# Patient Record
Sex: Male | Born: 1975 | Race: Black or African American | Hispanic: No | Smoking: Current every day smoker
Health system: Southern US, Community
[De-identification: ages and names within clinical notes are randomized; demographics above are authoritative.]

## PROBLEM LIST (undated history)

## (undated) DIAGNOSIS — J45909 Unspecified asthma, uncomplicated: Secondary | ICD-10-CM

## (undated) DIAGNOSIS — I10 Essential (primary) hypertension: Secondary | ICD-10-CM

## (undated) DIAGNOSIS — K852 Alcohol induced acute pancreatitis without necrosis or infection: Secondary | ICD-10-CM

## (undated) DIAGNOSIS — R42 Dizziness and giddiness: Secondary | ICD-10-CM

## (undated) DIAGNOSIS — M1990 Unspecified osteoarthritis, unspecified site: Secondary | ICD-10-CM

---

## 1998-04-17 ENCOUNTER — Emergency Department (HOSPITAL_COMMUNITY): Admission: EM | Admit: 1998-04-17 | Discharge: 1998-04-17 | Payer: Self-pay | Admitting: Emergency Medicine

## 1998-05-05 HISTORY — PX: REPLANTATION THUMB: SUR1233

## 1998-05-14 ENCOUNTER — Emergency Department (HOSPITAL_COMMUNITY): Admission: EM | Admit: 1998-05-14 | Discharge: 1998-05-14 | Payer: Self-pay | Admitting: Emergency Medicine

## 1998-11-04 ENCOUNTER — Emergency Department (HOSPITAL_COMMUNITY): Admission: EM | Admit: 1998-11-04 | Discharge: 1998-11-04 | Payer: Self-pay | Admitting: *Deleted

## 1999-03-10 ENCOUNTER — Emergency Department (HOSPITAL_COMMUNITY): Admission: EM | Admit: 1999-03-10 | Discharge: 1999-03-10 | Payer: Self-pay | Admitting: Internal Medicine

## 1999-03-13 ENCOUNTER — Emergency Department (HOSPITAL_COMMUNITY): Admission: EM | Admit: 1999-03-13 | Discharge: 1999-03-13 | Payer: Self-pay | Admitting: Emergency Medicine

## 1999-05-28 ENCOUNTER — Emergency Department (HOSPITAL_COMMUNITY): Admission: EM | Admit: 1999-05-28 | Discharge: 1999-05-28 | Payer: Self-pay | Admitting: Internal Medicine

## 1999-12-06 ENCOUNTER — Emergency Department (HOSPITAL_COMMUNITY): Admission: EM | Admit: 1999-12-06 | Discharge: 1999-12-06 | Payer: Self-pay | Admitting: Emergency Medicine

## 1999-12-09 ENCOUNTER — Ambulatory Visit (HOSPITAL_BASED_OUTPATIENT_CLINIC_OR_DEPARTMENT_OTHER): Admission: RE | Admit: 1999-12-09 | Discharge: 1999-12-09 | Payer: Self-pay | Admitting: Orthopedic Surgery

## 1999-12-17 ENCOUNTER — Encounter: Admission: RE | Admit: 1999-12-17 | Discharge: 2000-01-07 | Payer: Self-pay | Admitting: Orthopedic Surgery

## 2000-01-08 ENCOUNTER — Encounter: Admission: RE | Admit: 2000-01-08 | Discharge: 2000-01-20 | Payer: Self-pay | Admitting: Orthopedic Surgery

## 2000-01-09 ENCOUNTER — Emergency Department (HOSPITAL_COMMUNITY): Admission: EM | Admit: 2000-01-09 | Discharge: 2000-01-09 | Payer: Self-pay | Admitting: *Deleted

## 2000-06-23 ENCOUNTER — Emergency Department (HOSPITAL_COMMUNITY): Admission: EM | Admit: 2000-06-23 | Discharge: 2000-06-23 | Payer: Self-pay | Admitting: Emergency Medicine

## 2001-08-10 ENCOUNTER — Emergency Department (HOSPITAL_COMMUNITY): Admission: EM | Admit: 2001-08-10 | Discharge: 2001-08-10 | Payer: Self-pay | Admitting: Emergency Medicine

## 2003-06-26 ENCOUNTER — Emergency Department (HOSPITAL_COMMUNITY): Admission: EM | Admit: 2003-06-26 | Discharge: 2003-06-26 | Payer: Self-pay | Admitting: Emergency Medicine

## 2003-08-15 ENCOUNTER — Emergency Department (HOSPITAL_COMMUNITY): Admission: EM | Admit: 2003-08-15 | Discharge: 2003-08-15 | Payer: Self-pay | Admitting: Emergency Medicine

## 2003-09-06 ENCOUNTER — Emergency Department (HOSPITAL_COMMUNITY): Admission: EM | Admit: 2003-09-06 | Discharge: 2003-09-06 | Payer: Self-pay | Admitting: Emergency Medicine

## 2004-03-22 ENCOUNTER — Emergency Department (HOSPITAL_COMMUNITY): Admission: EM | Admit: 2004-03-22 | Discharge: 2004-03-22 | Payer: Self-pay | Admitting: Emergency Medicine

## 2004-09-26 ENCOUNTER — Emergency Department (HOSPITAL_COMMUNITY): Admission: EM | Admit: 2004-09-26 | Discharge: 2004-09-26 | Payer: Self-pay | Admitting: Emergency Medicine

## 2006-02-01 ENCOUNTER — Emergency Department (HOSPITAL_COMMUNITY): Admission: EM | Admit: 2006-02-01 | Discharge: 2006-02-01 | Payer: Self-pay | Admitting: Emergency Medicine

## 2007-04-16 ENCOUNTER — Emergency Department (HOSPITAL_COMMUNITY): Admission: EM | Admit: 2007-04-16 | Discharge: 2007-04-16 | Payer: Self-pay | Admitting: Emergency Medicine

## 2008-05-23 ENCOUNTER — Emergency Department (HOSPITAL_COMMUNITY): Admission: EM | Admit: 2008-05-23 | Discharge: 2008-05-23 | Payer: Self-pay | Admitting: Emergency Medicine

## 2009-03-01 ENCOUNTER — Emergency Department (HOSPITAL_COMMUNITY): Admission: EM | Admit: 2009-03-01 | Discharge: 2009-03-01 | Payer: Self-pay | Admitting: Emergency Medicine

## 2010-08-19 LAB — LIPASE, BLOOD: Lipase: 31 U/L (ref 11–59)

## 2010-08-19 LAB — COMPREHENSIVE METABOLIC PANEL
ALT: 17 U/L (ref 0–53)
AST: 39 U/L — ABNORMAL HIGH (ref 0–37)
Albumin: 4.1 g/dL (ref 3.5–5.2)
Alkaline Phosphatase: 72 U/L (ref 39–117)
BUN: 5 mg/dL — ABNORMAL LOW (ref 6–23)
CO2: 26 mEq/L (ref 19–32)
Calcium: 9.6 mg/dL (ref 8.4–10.5)
Chloride: 100 mEq/L (ref 96–112)
Creatinine, Ser: 0.83 mg/dL (ref 0.4–1.5)
GFR calc Af Amer: >60 mL/min (ref >60)
GFR calc non Af Amer: >60 mL/min (ref >60)
Glucose, Bld: 97 mg/dL (ref 70–99)
Potassium: 4.1 mEq/L (ref 3.5–5.1)
Sodium: 135 mEq/L (ref 135–145)
Total Bilirubin: 0.7 mg/dL (ref 0.3–1.2)
Total Protein: 7.6 g/dL (ref 6.0–8.3)

## 2010-08-19 LAB — CBC
HCT: 44.8 % (ref 39.0–52.0)
Hemoglobin: 14.4 g/dL (ref 13.0–17.0)
MCHC: 32.2 g/dL (ref 30.0–36.0)
MCV: 73.5 fL — ABNORMAL LOW (ref 78.0–100.0)
Platelets: 210 10*3/uL (ref 150–400)
RBC: 6.09 MIL/uL — ABNORMAL HIGH (ref 4.22–5.81)
RDW: 15.5 % (ref 11.5–15.5)
WBC: 6.5 10*3/uL (ref 4.0–10.5)

## 2010-08-19 LAB — DIFFERENTIAL
Basophils Absolute: 0 10*3/uL (ref 0.0–0.1)
Basophils Relative: 0 % (ref 0–1)
Eosinophils Absolute: 0.2 10*3/uL (ref 0.0–0.7)
Eosinophils Relative: 3 % (ref 0–5)
Lymphocytes Relative: 22 % (ref 12–46)
Lymphs Abs: 1.4 10*3/uL (ref 0.7–4.0)
Monocytes Absolute: 0.9 10*3/uL (ref 0.1–1.0)
Monocytes Relative: 14 % — ABNORMAL HIGH (ref 3–12)
Neutro Abs: 3.9 10*3/uL (ref 1.7–7.7)
Neutrophils Relative %: 61 % (ref 43–77)

## 2010-09-20 NOTE — Op Note (Signed)
[facility]  Patient:    Riley Kim, Riley Kim Visit Number: [id number] MRN: [id number]          Service Type: DSU Location: [facility] Attending Physician:  Marlowe Shores Proc. Date: 12/09/99 Admit Date:  12/09/1999   CC:         Two copies to Dr. Mina Marble                           Operative Report  PREOPERATIVE DIAGNOSIS:  Right thumb dorsal radial laceration.  POSTOPERATIVE DIAGNOSIS:  Right thumb dorsal radial laceration.  OPERATION:  Repair of extensor hallucis longus tendon and repair of extensor pollicis brevis tendon, right thumb.  SURGEON:  Artist Pais. Mina Marble, M.D.  ANESTHESIA:  General.  TOURNIQUET TIME:  48 minutes  COMPLICATIONS:  None.  DRAINS:  None.  DESCRIPTION OF PROCEDURE:  The patient was taken to the operating room and after induction of adequate general anesthesia, the right upper extremity was prepped and draped in the usual sterile fashion.  An Esmarch was used to exsanguinate the limb and the tourniquet was inflated to 250 mmHg. At this point, in time a transverse laceration over the dorsal aspect of the right thumb in the area of the base of the metacarpal area was extended in a Z type fashion, proximally and distally and flaps appropriately raised.  The flaps were sutured down using 4-0 nylon as a retention sutures.  Dissection down to the soft tissues revealed a laceration of the extensor pollicis brevis tendon and the extensor pollicis longus tendon and some slight retention proximally of the proximal ends.  The wound was thoroughly irrigated.  The tendon ends were debrided of clot and nonviable material.  Tendon repairs were then performed using 3-0 double armed Ethibon in a modified Tagama type suture with a 6-0 Prolene locking epitendinous stitch.  This was done for both tendons. The wound was then thoroughly irrigated. Hemostasis was achieved with bipolar cautery and the wound was then closed with 4-0 nylon  in a combination of simple and horizontal mattress sutures.  A sterile dressing of Xeroform, 4 x 4s, fluffs and a radial gutter splint was applied.  The patient tolerated the procedure well and went to the recovery room in stable fashion. Attending Physician:  Marlowe Shores DD:  12/09/99 TD:  12/09/99 Job: [id number]

## 2011-02-10 LAB — POCT RAPID STREP A: Streptococcus, Group A Screen (Direct): NEGATIVE

## 2012-09-21 ENCOUNTER — Encounter (HOSPITAL_COMMUNITY): Payer: Self-pay | Admitting: Emergency Medicine

## 2012-09-21 ENCOUNTER — Emergency Department (HOSPITAL_COMMUNITY)
Admission: EM | Admit: 2012-09-21 | Discharge: 2012-09-21 | Disposition: A | Discharge status: Home / Self Care | Payer: Self-pay | Attending: Emergency Medicine | Admitting: Emergency Medicine

## 2012-09-21 ENCOUNTER — Emergency Department (HOSPITAL_COMMUNITY): Payer: Self-pay

## 2012-09-21 DIAGNOSIS — F17200 Nicotine dependence, unspecified, uncomplicated: Secondary | ICD-10-CM | POA: Insufficient documentation

## 2012-09-21 DIAGNOSIS — H81399 Other peripheral vertigo, unspecified ear: Secondary | ICD-10-CM

## 2012-09-21 DIAGNOSIS — Z880 Allergy status to penicillin: Secondary | ICD-10-CM | POA: Insufficient documentation

## 2012-09-21 DIAGNOSIS — J45909 Unspecified asthma, uncomplicated: Secondary | ICD-10-CM | POA: Insufficient documentation

## 2012-09-21 DIAGNOSIS — H538 Other visual disturbances: Secondary | ICD-10-CM | POA: Insufficient documentation

## 2012-09-21 DIAGNOSIS — R42 Dizziness and giddiness: Secondary | ICD-10-CM | POA: Insufficient documentation

## 2012-09-21 HISTORY — DX: Unspecified asthma, uncomplicated: J45.909

## 2012-09-21 LAB — GLUCOSE, CAPILLARY

## 2012-09-21 LAB — BASIC METABOLIC PANEL
Calcium: 10.3 mg/dL (ref 8.4–10.5)
GFR calc Af Amer: >90 mL/min (ref >90)
GFR calc non Af Amer: >90 mL/min (ref >90)
Glucose, Bld: 95 mg/dL (ref 70–99)
Sodium: 139 mEq/L (ref 135–145)

## 2012-09-21 LAB — CBC
MCH: 24.2 pg — ABNORMAL LOW (ref 26.0–34.0)
MCHC: 32.9 g/dL (ref 30.0–36.0)
Platelets: 167 10*3/uL (ref 150–400)
RDW: 15.2 % (ref 11.5–15.5)

## 2012-09-21 MED ORDER — MECLIZINE HCL 25 MG PO TABS
25.0000 mg | ORAL_TABLET | Freq: Three times a day (TID) | ORAL | Status: DC | PRN
Start: 2012-09-21 — End: 2013-12-29

## 2012-09-21 NOTE — ED Notes (Signed)
Patient transported to MRI 

## 2012-09-21 NOTE — ED Notes (Signed)
Patient is alert and orientedx4.  Patient was explained discharge instructions and they understood them with no questions.  The patient's wife, Sandor Arboleda, is taking the patient home.

## 2012-09-21 NOTE — ED Notes (Signed)
Pt c/o dizziness and blurry vision today and was htn when checked BP; pt denies other complaint at present

## 2012-09-21 NOTE — ED Provider Notes (Signed)
MRI has come back negative for stroke or tumor. He'll be discharged with diagnosis of peripheral vertigo and given a prescription for meclizine.  Results for orders placed during the hospital encounter of 09/21/12  CBC      Result Value Range   WBC 6.9  4.0 - 10.5 K/uL   RBC 5.79  4.22 - 5.81 MIL/uL   Hemoglobin 14.0  13.0 - 17.0 g/dL   HCT 24.4  01.0 - 27.2 %   MCV 73.6 (*) 78.0 - 100.0 fL   MCH 24.2 (*) 26.0 - 34.0 pg   MCHC 32.9  30.0 - 36.0 g/dL   RDW 53.6  64.4 - 03.4 %   Platelets 167  150 - 400 K/uL  BASIC METABOLIC PANEL      Result Value Range   Sodium 139  135 - 145 mEq/L   Potassium 3.9  3.5 - 5.1 mEq/L   Chloride 101  96 - 112 mEq/L   CO2 23  19 - 32 mEq/L   Glucose, Bld 95  70 - 99 mg/dL   BUN 10  6 - 23 mg/dL   Creatinine, Ser 7.42  0.50 - 1.35 mg/dL   Calcium 59.5  8.4 - 63.8 mg/dL   GFR calc non Af Amer >90  >90 mL/min   GFR calc Af Amer >90  >90 mL/min  GLUCOSE, CAPILLARY      Result Value Range   Glucose-Capillary 90  70 - 99 mg/dL   Comment 1 Documented in Chart     Comment 2 Notify RN    POCT I-STAT TROPONIN I      Result Value Range   Troponin i, poc 0.00  0.00 - 0.08 ng/mL   Comment 3            Mr Brain Wo Contrast  09/21/2012   *RADIOLOGY REPORT*  Clinical Data: Unsteady gait.  Dizziness.  Blurred vision.  MRI HEAD WITHOUT CONTRAST  Technique:  Multiplanar, multiecho pulse sequences of the brain and surrounding structures were obtained according to standard protocol without intravenous contrast.  Comparison: None.  Findings: No acute infarct.  No intracranial hemorrhage.  No hydrocephalus.  No intracranial mass lesion detected on this unenhanced exam.  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  Cervical medullary junction, pituitary region, pineal region and orbital structures unremarkable.  Minimal to mild paranasal sinus mucosal thickening.  Partial opacification  mastoid air cells greater on the right. Slightly asymmetric prominence of soft tissue posterior-superior nasopharynx which may represent prominent adenoidal tissue. Mucosal lesion not excluded.  IMPRESSION: No acute infarct.  No intracranial hemorrhage.  No intracranial mass lesion detected on this unenhanced exam.  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  Minimal to mild paranasal sinus mucosal thickening.  Partial opacification mastoid air cells greater on the right. Slightly asymmetric prominence of soft tissue posterior-superior nasopharynx which may represent prominent adenoidal tissue. Mucosal lesion not excluded.   Original Report Authenticated By: Lacy Duverney, M.D.      Dione Booze, MD 09/21/12 [phone]

## 2012-09-21 NOTE — ED Notes (Signed)
CBG was 90. Notified Nurse Shanda Bumps.

## 2012-09-21 NOTE — ED Provider Notes (Signed)
History     CSN: [id number]  Arrival date & time 09/21/12  1131   First MD Initiated Contact with Patient 09/21/12 1328      Chief Complaint  Patient presents with  . Dizziness  . Blurred Vision    (Consider location/radiation/quality/duration/timing/severity/associated sxs/prior treatment) The history is provided by the patient.  pt c/o 'dizziness' onset this morning when got up. States has been constant since. Pt describes his dizziness as neither clearly lightheadedness/near syncope, or as room spinning, states 'just dizzy'.  Does note feeling sl lightheaded if stands fast, also notes vague sense of movement in front of him or of room but no spinning sensation. States when walking feels unsteady, but is able to walk without falling. Denies hx similar symptoms in past. No hx vertigo. No hx syncope. Denies headache. No ear pain, tinnitus or hearing loss. No change w head movement or position. Denies sinus congestion or uri symptoms. No fever or chills. Denies eye pain or change in vision. No change in speech. No numbness or weakness. Pt denies any recent fall or injury. Pt denies passing out. No palpitations or sense of irregular heart beat. No chest pain or discomfort. No sob or unusual doe. Normal appetite. No nvd. No abd pain. Stools normal color, no melena or hematochezia. No hx anemia or gi bleed. Denies any recent medication use.   Past Medical History  Diagnosis Date  . Asthma     History reviewed. No pertinent past surgical history.  History reviewed. No pertinent family history.  History  Substance Use Topics  . Smoking status: Current Every Day Smoker  . Smokeless tobacco: Not on file  . Alcohol Use: Yes      Review of Systems  Constitutional: Negative for fever and chills.  HENT: Negative for hearing loss, ear pain, congestion, sore throat, rhinorrhea, neck pain and tinnitus.   Eyes: Negative for pain and visual disturbance.  Respiratory: Negative for shortness of  breath.   Cardiovascular: Negative for chest pain.  Gastrointestinal: Negative for abdominal pain.  Genitourinary: Negative for flank pain.  Musculoskeletal: Negative for back pain.  Skin: Negative for rash.  Neurological: Negative for syncope, weakness, numbness and headaches.  Hematological: Does not bruise/bleed easily.  Psychiatric/Behavioral: Negative for confusion.    Allergies  Penicillins  Home Medications  No current outpatient prescriptions on file.  BP 147/96  Pulse 80  Temp(Src) 98.3 F (36.8 C) (Oral)  Resp 14  SpO2 97%  Physical Exam  Nursing note and vitals reviewed. Constitutional: He is oriented to person, place, and time. He appears well-developed and well-nourished. No distress.  HENT:  Head: Atraumatic.  Mouth/Throat: Oropharynx is clear and moist.  tms wnl.   Eyes: Conjunctivae and EOM are normal. Pupils are equal, round, and reactive to light. No scleral icterus.  Neck: Normal range of motion. Neck supple. No tracheal deviation present. No thyromegaly present.  No bruit  Cardiovascular: Normal rate, regular rhythm, normal heart sounds and intact distal pulses.  Exam reveals no gallop and no friction rub.   No murmur heard. Pulmonary/Chest: Effort normal and breath sounds normal. No accessory muscle usage. No respiratory distress.  Abdominal: Soft. Bowel sounds are normal. He exhibits no distension and no mass. There is no tenderness. There is no rebound and no guarding.  Musculoskeletal: Normal range of motion. He exhibits no edema and no tenderness.  Neurological: He is alert and oriented to person, place, and time. No cranial nerve deficit.  Motor intact bil, 5/5. No pronator  drift. Romberg testing unremarkable. Gait mildly unsteady.   Skin: Skin is warm and dry.  Psychiatric: He has a normal mood and affect.    ED Course  Procedures (including critical care time)   Results for orders placed during the hospital encounter of 09/21/12  CBC       Result Value Range   WBC 6.9  4.0 - 10.5 K/uL   RBC 5.79  4.22 - 5.81 MIL/uL   Hemoglobin 14.0  13.0 - 17.0 g/dL   HCT 08.6  57.8 - 46.9 %   MCV 73.6 (*) 78.0 - 100.0 fL   MCH 24.2 (*) 26.0 - 34.0 pg   MCHC 32.9  30.0 - 36.0 g/dL   RDW 62.9  52.8 - 41.3 %   Platelets 167  150 - 400 K/uL  BASIC METABOLIC PANEL      Result Value Range   Sodium 139  135 - 145 mEq/L   Potassium 3.9  3.5 - 5.1 mEq/L   Chloride 101  96 - 112 mEq/L   CO2 23  19 - 32 mEq/L   Glucose, Bld 95  70 - 99 mg/dL   BUN 10  6 - 23 mg/dL   Creatinine, Ser 2.44  0.50 - 1.35 mg/dL   Calcium 01.0  8.4 - 27.2 mg/dL   GFR calc non Af Amer >90  >90 mL/min   GFR calc Af Amer >90  >90 mL/min  GLUCOSE, CAPILLARY      Result Value Range   Glucose-Capillary 90  70 - 99 mg/dL   Comment 1 Documented in Chart     Comment 2 Notify RN    POCT I-STAT TROPONIN I      Result Value Range   Troponin i, poc 0.00  0.00 - 0.08 ng/mL   Comment 3               MDM  Labs. Mri.  Reviewed nursing notes and prior charts for additional history.   Recheck no change. Pt updated re plan, mri called-  Indicate will do scan in 10-15 minutes, pt informed.  Pt signed out to Dr Preston Fleeting to check MRI when back. If MRI normal, anticipate probable d/c.          Suzi Roots, MD 09/21/12 [phone]

## 2013-12-29 ENCOUNTER — Emergency Department (HOSPITAL_COMMUNITY): Payer: Medicaid Other

## 2013-12-29 ENCOUNTER — Inpatient Hospital Stay (HOSPITAL_COMMUNITY)
Admission: EM | Admit: 2013-12-29 | Discharge: 2013-12-31 | DRG: 439 | Disposition: A | Discharge status: Home / Self Care | Payer: Medicaid Other | Attending: Internal Medicine | Admitting: Internal Medicine

## 2013-12-29 ENCOUNTER — Encounter (HOSPITAL_COMMUNITY): Payer: Self-pay | Admitting: Emergency Medicine

## 2013-12-29 DIAGNOSIS — E872 Acidosis, unspecified: Secondary | ICD-10-CM | POA: Diagnosis present

## 2013-12-29 DIAGNOSIS — F1010 Alcohol abuse, uncomplicated: Secondary | ICD-10-CM | POA: Diagnosis present

## 2013-12-29 DIAGNOSIS — I10 Essential (primary) hypertension: Secondary | ICD-10-CM | POA: Diagnosis present

## 2013-12-29 DIAGNOSIS — E876 Hypokalemia: Secondary | ICD-10-CM | POA: Diagnosis present

## 2013-12-29 DIAGNOSIS — R7989 Other specified abnormal findings of blood chemistry: Secondary | ICD-10-CM | POA: Diagnosis present

## 2013-12-29 DIAGNOSIS — E8729 Other acidosis: Secondary | ICD-10-CM

## 2013-12-29 DIAGNOSIS — K859 Acute pancreatitis without necrosis or infection, unspecified: Secondary | ICD-10-CM | POA: Diagnosis not present

## 2013-12-29 DIAGNOSIS — R748 Abnormal levels of other serum enzymes: Secondary | ICD-10-CM

## 2013-12-29 DIAGNOSIS — J4530 Mild persistent asthma, uncomplicated: Secondary | ICD-10-CM | POA: Diagnosis present

## 2013-12-29 DIAGNOSIS — F17200 Nicotine dependence, unspecified, uncomplicated: Secondary | ICD-10-CM | POA: Diagnosis present

## 2013-12-29 DIAGNOSIS — F17210 Nicotine dependence, cigarettes, uncomplicated: Secondary | ICD-10-CM | POA: Diagnosis present

## 2013-12-29 DIAGNOSIS — K852 Alcohol induced acute pancreatitis without necrosis or infection: Secondary | ICD-10-CM

## 2013-12-29 DIAGNOSIS — J45909 Unspecified asthma, uncomplicated: Secondary | ICD-10-CM | POA: Diagnosis present

## 2013-12-29 DIAGNOSIS — R062 Wheezing: Secondary | ICD-10-CM

## 2013-12-29 HISTORY — DX: Alcohol induced acute pancreatitis without necrosis or infection: K85.20

## 2013-12-29 HISTORY — DX: Dizziness and giddiness: R42

## 2013-12-29 HISTORY — DX: Unspecified osteoarthritis, unspecified site: M19.90

## 2013-12-29 LAB — COMPREHENSIVE METABOLIC PANEL
ALT: 41 U/L (ref 0–53)
AST: 129 U/L — ABNORMAL HIGH (ref 0–37)
Albumin: 3.8 g/dL (ref 3.5–5.2)
Alkaline Phosphatase: 73 U/L (ref 39–117)
Anion gap: 19 — ABNORMAL HIGH (ref 5–15)
BUN: 7 mg/dL (ref 6–23)
CO2: 26 mEq/L (ref 19–32)
Calcium: 10.5 mg/dL (ref 8.4–10.5)
Chloride: 94 mEq/L — ABNORMAL LOW (ref 96–112)
Creatinine, Ser: 0.6 mg/dL (ref 0.50–1.35)
GFR calc Af Amer: >90 mL/min (ref >90)
GFR calc non Af Amer: >90 mL/min (ref >90)
Glucose, Bld: 103 mg/dL — ABNORMAL HIGH (ref 70–99)
Potassium: 3.2 mEq/L — ABNORMAL LOW (ref 3.7–5.3)
Sodium: 139 mEq/L (ref 137–147)
Total Bilirubin: 0.5 mg/dL (ref 0.3–1.2)
Total Protein: 7.6 g/dL (ref 6.0–8.3)

## 2013-12-29 LAB — CBC WITH DIFFERENTIAL/PLATELET
Basophils Absolute: 0.1 10*3/uL (ref 0.0–0.1)
Basophils Relative: 1 % (ref 0–1)
Eosinophils Absolute: 0.1 10*3/uL (ref 0.0–0.7)
Eosinophils Relative: 1 % (ref 0–5)
HCT: 37.9 % — ABNORMAL LOW (ref 39.0–52.0)
Hemoglobin: 12.7 g/dL — ABNORMAL LOW (ref 13.0–17.0)
Lymphocytes Relative: 17 % (ref 12–46)
Lymphs Abs: 1.8 10*3/uL (ref 0.7–4.0)
MCH: 24.9 pg — ABNORMAL LOW (ref 26.0–34.0)
MCHC: 33.5 g/dL (ref 30.0–36.0)
MCV: 74.3 fL — ABNORMAL LOW (ref 78.0–100.0)
Monocytes Absolute: 1.3 10*3/uL — ABNORMAL HIGH (ref 0.1–1.0)
Monocytes Relative: 13 % — ABNORMAL HIGH (ref 3–12)
Neutro Abs: 7 10*3/uL (ref 1.7–7.7)
Neutrophils Relative %: 68 % (ref 43–77)
Platelets: 181 10*3/uL (ref 150–400)
RBC: 5.1 MIL/uL (ref 4.22–5.81)
RDW: 14.8 % (ref 11.5–15.5)
WBC: 10.3 10*3/uL (ref 4.0–10.5)

## 2013-12-29 LAB — HIV ANTIBODY (ROUTINE TESTING W REFLEX): HIV: NONREACTIVE

## 2013-12-29 LAB — URINALYSIS, ROUTINE W REFLEX MICROSCOPIC
Glucose, UA: NEGATIVE mg/dL
Hgb urine dipstick: NEGATIVE
Ketones, ur: NEGATIVE mg/dL
Leukocytes, UA: NEGATIVE
Nitrite: NEGATIVE
Protein, ur: NEGATIVE mg/dL
Specific Gravity, Urine: 1.01 (ref 1.005–1.030)
Urobilinogen, UA: 0.2 mg/dL (ref 0.0–1.0)
pH: 7.5 (ref 5.0–8.0)

## 2013-12-29 LAB — LIPID PANEL
CHOL/HDL RATIO: 2 ratio
CHOLESTEROL: 172 mg/dL (ref 0–200)
HDL: 85 mg/dL (ref >39)
LDL Cholesterol: 72 mg/dL (ref 0–99)
TRIGLYCERIDES: 77 mg/dL (ref <150)
VLDL: 15 mg/dL (ref 0–40)

## 2013-12-29 LAB — OSMOLALITY: OSMOLALITY: 278 mosm/kg (ref 275–300)

## 2013-12-29 LAB — MAGNESIUM: Magnesium: 1.3 mg/dL — ABNORMAL LOW (ref 1.5–2.5)

## 2013-12-29 LAB — RAPID URINE DRUG SCREEN, HOSP PERFORMED
Amphetamines: NOT DETECTED
BARBITURATES: NOT DETECTED
Benzodiazepines: NOT DETECTED
Cocaine: NOT DETECTED
Opiates: POSITIVE — AB
Tetrahydrocannabinol: NOT DETECTED

## 2013-12-29 LAB — LIPASE, BLOOD: Lipase: 815 U/L — ABNORMAL HIGH (ref 11–59)

## 2013-12-29 LAB — ETHANOL: Alcohol, Ethyl (B): <11 mg/dL (ref 0–11)

## 2013-12-29 LAB — LACTIC ACID, PLASMA: LACTIC ACID, VENOUS: 1 mmol/L (ref 0.5–2.2)

## 2013-12-29 MED ORDER — ENOXAPARIN SODIUM 40 MG/0.4ML ~~LOC~~ SOLN
40.0000 mg | SUBCUTANEOUS | Status: DC
  Filled 2013-12-29 (×3): qty 0.4

## 2013-12-29 MED ORDER — ALBUTEROL SULFATE (2.5 MG/3ML) 0.083% IN NEBU
5.0000 mg | INHALATION_SOLUTION | Freq: Once | RESPIRATORY_TRACT | Status: AC
  Administered 2013-12-29: 5 mg via RESPIRATORY_TRACT
  Filled 2013-12-29: qty 6

## 2013-12-29 MED ORDER — SODIUM CHLORIDE 0.9 % IV BOLUS (SEPSIS)
1000.0000 mL | Freq: Once | INTRAVENOUS | Status: AC
  Administered 2013-12-29: 1000 mL via INTRAVENOUS

## 2013-12-29 MED ORDER — MORPHINE SULFATE 2 MG/ML IJ SOLN: 2.0000 mg | INTRAMUSCULAR | Status: DC | PRN

## 2013-12-29 MED ORDER — ADULT MULTIVITAMIN W/MINERALS CH
1.0000 | ORAL_TABLET | Freq: Every day | ORAL | Status: DC
  Administered 2013-12-29 – 2013-12-31 (×2): 1 via ORAL
  Filled 2013-12-29 (×3): qty 1

## 2013-12-29 MED ORDER — IOHEXOL 300 MG/ML  SOLN
100.0000 mL | Freq: Once | INTRAMUSCULAR | Status: AC | PRN
  Administered 2013-12-29: 100 mL via INTRAVENOUS

## 2013-12-29 MED ORDER — THIAMINE HCL 100 MG/ML IJ SOLN
100.0000 mg | Freq: Every day | INTRAMUSCULAR | Status: DC
  Administered 2013-12-30: 100 mg via INTRAVENOUS
  Filled 2013-12-29 (×3): qty 1

## 2013-12-29 MED ORDER — NICOTINE 21 MG/24HR TD PT24
21.0000 mg | MEDICATED_PATCH | Freq: Every day | TRANSDERMAL | Status: DC
  Administered 2013-12-29 – 2013-12-30 (×2): 21 mg via TRANSDERMAL
  Filled 2013-12-29 (×3): qty 1

## 2013-12-29 MED ORDER — LORAZEPAM 1 MG PO TABS: 1.0000 mg | ORAL_TABLET | Freq: Four times a day (QID) | ORAL | Status: DC | PRN

## 2013-12-29 MED ORDER — ONDANSETRON 4 MG PO TBDP
8.0000 mg | ORAL_TABLET | ORAL | Status: AC
  Administered 2013-12-29: 8 mg via ORAL
  Filled 2013-12-29: qty 2

## 2013-12-29 MED ORDER — ONDANSETRON HCL 4 MG PO TABS: 4.0000 mg | ORAL_TABLET | Freq: Four times a day (QID) | ORAL | Status: DC | PRN

## 2013-12-29 MED ORDER — LACTATED RINGERS IV SOLN: INTRAVENOUS | Status: DC

## 2013-12-29 MED ORDER — LORAZEPAM 2 MG/ML IJ SOLN: 1.0000 mg | Freq: Four times a day (QID) | INTRAMUSCULAR | Status: DC | PRN

## 2013-12-29 MED ORDER — LORAZEPAM 1 MG PO TABS: 0.0000 mg | ORAL_TABLET | Freq: Two times a day (BID) | ORAL | Status: DC

## 2013-12-29 MED ORDER — IOHEXOL 300 MG/ML  SOLN
25.0000 mL | Freq: Once | INTRAMUSCULAR | Status: AC | PRN
  Administered 2013-12-29: 25 mL via ORAL

## 2013-12-29 MED ORDER — POTASSIUM CHLORIDE CRYS ER 20 MEQ PO TBCR
40.0000 meq | EXTENDED_RELEASE_TABLET | Freq: Once | ORAL | Status: AC
Start: 2013-12-29 — End: 2013-12-29
  Administered 2013-12-29: 40 meq via ORAL
  Filled 2013-12-29: qty 2

## 2013-12-29 MED ORDER — FOLIC ACID 1 MG PO TABS
1.0000 mg | ORAL_TABLET | Freq: Every day | ORAL | Status: DC
  Administered 2013-12-29 – 2013-12-31 (×2): 1 mg via ORAL
  Filled 2013-12-29 (×3): qty 1

## 2013-12-29 MED ORDER — ONDANSETRON HCL 4 MG/2ML IJ SOLN: 4.0000 mg | Freq: Four times a day (QID) | INTRAMUSCULAR | Status: DC | PRN

## 2013-12-29 MED ORDER — MORPHINE SULFATE 2 MG/ML IJ SOLN
2.0000 mg | INTRAMUSCULAR | Status: DC | PRN
  Administered 2013-12-29 – 2013-12-31 (×6): 2 mg via INTRAVENOUS
  Filled 2013-12-29 (×7): qty 1

## 2013-12-29 MED ORDER — LACTATED RINGERS IV SOLN
INTRAVENOUS | Status: AC
  Administered 2013-12-29: 1000 mL via INTRAVENOUS
  Administered 2013-12-30 (×2): via INTRAVENOUS

## 2013-12-29 MED ORDER — LORAZEPAM 1 MG PO TABS: 0.0000 mg | ORAL_TABLET | Freq: Four times a day (QID) | ORAL | Status: DC

## 2013-12-29 MED ORDER — MORPHINE SULFATE 4 MG/ML IJ SOLN
6.0000 mg | Freq: Once | INTRAMUSCULAR | Status: AC
  Administered 2013-12-29: 6 mg via INTRAVENOUS
  Filled 2013-12-29: qty 2

## 2013-12-29 MED ORDER — VITAMIN B-1 100 MG PO TABS
100.0000 mg | ORAL_TABLET | Freq: Every day | ORAL | Status: DC
  Administered 2013-12-29 – 2013-12-31 (×2): 100 mg via ORAL
  Filled 2013-12-29 (×3): qty 1

## 2013-12-29 NOTE — Progress Notes (Signed)
Pt Bp 152/124mmHg, paged on call cover. No new orders made. We will continue to monitor.

## 2013-12-29 NOTE — ED Notes (Signed)
Patient transported to CT 

## 2013-12-29 NOTE — H&P (Signed)
  I have seen and examined the patient myself, and I have reviewed the note by Staci Righter, MS III and was present during the interview and physical exam.  Please see my separate H&P for additional findings, assessment, and plan.   Signed: Jill Alexanders, DO PGY-1 Internal Medicine Resident Pager # [phone] 12/29/2013 7:38 PM

## 2013-12-29 NOTE — ED Notes (Signed)
Pt is refusing to have a PIV.

## 2013-12-29 NOTE — H&P (Signed)
Date: 12/29/2013               Patient Name:  Riley Kim MRN: [id number]  DOB: 07/18/1975 Age / Sex: 38 y.o., male   PCP: No Pcp Per Patient         Medical Service: Internal Medicine Teaching Service         Attending Physician: Dr. Madilyn Fireman, MD    First Contact: Pete Glatter Pager: [phone]  Second Contact: Third Contact: Dr. Marvel Plan Dr. Alice Rieger Pager: Pager: [phone]       After Hours (After 5p/  First Contact Pager: [phone]  weekends / holidays): Second Contact Pager: [phone]   Chief Complaint: abdominal pain, diarrhea, vomiting  History of Present Illness: Mr. Riley Kim is a 38 yo male with PMHx of alcohol abuse and asthma who presented to the ED with complaint of epigastric pain, nausea, vomiting and diarrhea. Patient stated the epigastric pain started about one week ago and has been worsening. The pain is sharp, constant and located in mid epigastric region and radiating to his back. Nothing makes it better or worse. He denies ever having this type of pain before. Morphine brings his pain down to a 4/10. Patient admits to drinking 1 pint of gin a day for several years. He has never experienced withdrawal symptoms; however, he has never gone a long period of time without drinking. He also smokes 1 ppd for 20 years. He denies any illicit drug use. He denies any family history of pancreatitis or liver disease. Patient is not on any medications at home. He does not have an inhaler at home for his asthma because he states he doesn't need it and has not had an asthma exacerbation in a long time. He admits to epigastric pain, nausea, vomiting and diarrhea. He denies headache, dizziness, chest pain, or shortness of breath.   Meds: Current Facility-Administered Medications  Medication Dose Route Frequency Provider Last Rate Last Dose  . lactated ringers infusion   Intravenous Continuous Jessee Avers, MD      . lactated ringers infusion   Intravenous Continuous  Osa Craver, MD      . morphine 2 MG/ML injection 2 mg  2 mg Intravenous Q3H PRN Osa Craver, MD       No current outpatient prescriptions on file.    Allergies: Allergies as of 12/29/2013 - Review Complete 12/29/2013  Allergen Reaction Noted  . Penicillins Other (See Comments) 09/21/2012   Past Medical History  Diagnosis Date  . Asthma   . Vertigo    Past Surgical History  Procedure Laterality Date  . Replantation thumb Right    No family history on file. History   Social History  . Marital Status: Married    Spouse Name: N/A    Number of Children: N/A  . Years of Education: N/A   Occupational History  .  [employer]   Social History Main Topics  . Smoking status: Current Every Day Smoker -- 1.00 packs/day for 20 years    Types: Cigarettes  . Smokeless tobacco: Not on file  . Alcohol Use: 112.5 oz/week    225 drink(s) per week  . Drug Use: No  . Sexual Activity: Yes   Other Topics Concern  . Not on file   Social History Narrative  . No narrative on file    Review of Systems: General: Denies fever, chills, fatigue, change in appetite and diaphoresis.  Respiratory: Denies SOB, cough, DOE, chest tightness.   Cardiovascular: Denies  chest pain and palpitations.  Gastrointestinal: Admits to epigastric pain radiating to the back, nausea, vomiting, and diarrhea. He denies constipation, blood in stool and abdominal distention.  Genitourinary: Denies dysuria, urgency, frequency, hematuria, suprapubic pain and flank pain. Endocrine: Denies hot or cold intolerance, polyuria, and polydipsia. Musculoskeletal: Denies myalgias, back pain, joint swelling, arthralgias and gait problem.  Skin: Denies pallor, rash and wounds.  Neurological: Denies dizziness, headaches, weakness, lightheadedness, numbness, seizures, and syncope. Psychiatric/Behavioral: Denies mood changes, confusion, nervousness, sleep disturbance and agitation.  Physical Exam: Filed Vitals:    12/29/13 1445 12/29/13 1500 12/29/13 1515 12/29/13 1530  BP: 129/82 133/83 134/86 129/69  Pulse: 93 88 84 89  Temp:      TempSrc:      Resp:      SpO2: 93% 93% 97% 94%   General: Vital signs reviewed.  Patient is well-developed and well-nourished, in mild acute distress and cooperative with exam.  Eyes: EOMI, conjunctivae normal, no scleral icterus.  Cardiovascular: RRR, S1 normal, S2 normal, no murmurs, gallops, or rubs. Pulmonary/Chest: Diffuse wheezes, but no rales or rhonchi. Abdominal: Tender to palpation worst in the epigastric region, but present in the RUQ and LUQ, with guarding, non-distended, hypoactive BS. Musculoskeletal: No joint deformities, erythema, or stiffness, ROM full and nontender. Extremities: No lower extremity edema bilaterally,  pulses symmetric and intact bilaterally. No cyanosis or clubbing. Neurological: A&O x3, Strength is normal and symmetric bilaterally, cranial nerve II-XII are grossly intact, no focal motor deficit, sensory intact to light touch bilaterally.  Skin: Warm, dry and intact. No rashes or erythema. Psychiatric: Normal mood and affect. speech and behavior is normal. Cognition and memory are normal.   Lab results: Basic Metabolic Panel:  Recent Labs  12/29/13 0715 12/29/13 1124  NA 139  --   K 3.2*  --   CL 94*  --   CO2 26  --   GLUCOSE 103*  --   BUN 7  --   CREATININE 0.60  --   CALCIUM 10.5  --   MG  --  1.3*   Liver Function Tests:  Recent Labs  12/29/13 0715  AST 129*  ALT 41  ALKPHOS 73  BILITOT 0.5  PROT 7.6  ALBUMIN 3.8    Recent Labs  12/29/13 0715  LIPASE 815*   CBC:  Recent Labs  12/29/13 0715  WBC 10.3  NEUTROABS 7.0  HGB 12.7*  HCT 37.9*  MCV 74.3*  PLT 181   Urinalysis:  Recent Labs  12/29/13 1019  COLORURINE YELLOW  LABSPEC 1.010  PHURINE 7.5  GLUCOSEU NEGATIVE  HGBUR NEGATIVE  BILIRUBINUR SMALL*  KETONESUR NEGATIVE  PROTEINUR NEGATIVE  UROBILINOGEN 0.2  NITRITE NEGATIVE    LEUKOCYTESUR NEGATIVE   Imaging results:  Ct Abdomen Pelvis W Contrast  12/29/2013   CLINICAL DATA:  Abdominal pain radiating to the back for 1 week. Vomiting.  EXAM: CT ABDOMEN AND PELVIS WITH CONTRAST  TECHNIQUE: Multidetector CT imaging of the abdomen and pelvis was performed using the standard protocol following bolus administration of intravenous contrast.  CONTRAST:  100 mL OMNIPAQUE IOHEXOL 300 MG/ML  SOLN  COMPARISON:  None.  FINDINGS: The lung bases are clear. No pleural or pericardial effusion. A juxta phrenic lymph node on the right measures 0.7 cm on image 10 is noted. No retrocrural adenopathy is seen.  There is stranding about the pancreas most consistent with pancreatitis. The pancreas enhances homogeneously. No focal fluid collection is identified. The splenic and portal veins are patent.  There  is diffuse fatty infiltration of the liver without focal lesion. The gallbladder, adrenal glands, spleen, kidneys and biliary tree are unremarkable. The stomach, small and large bowel and appendix appear normal. No abdominal or pelvic lymphadenopathy is seen. No focal bony abnormality is identified.  IMPRESSION: Findings consistent with pancreatitis without pancreatic necrosis or pseudocyst formation.  Diffuse fatty infiltration of the liver.   Electronically Signed   By: Inge Rise M.D.   On: 12/29/2013 09:37    Assessment & Plan by Problem:  Acute Pancreatitis: Patient presented with abdominal pain, nausea, vomiting and diarrhea. Patient was afebrile, and without leukocytosis on admission (10.3). Lipase is was 815 on admission. AST/ALT 129/41. Alk phosphotase 73. Anion gap 19. Lactic acid 1.0, normal. Urinalysis negative for infection. UDS was only positive for opiates (received in ED). Alcohol level <11. HIV was nonreactive. Lipid panel was normal.  Patient received 1 L NS bolus, zofran, and morphine 6 mg IV in the ED.  CT Abdomen/Pelvis showed stranding about the pancreas most consistent  with pancreatitis without pancreatitis necrosis or pseudocyst formation. There is also diffuse fatty infiltration of the liver without focal lesion. Patient admits to alcohol abuse. He drinks 1 pint of gin a day for greater than 3 years. He also admits to tobacco use with 1 ppd for 20 years. Denies illicit drug use. Symptoms and tests are most consistent with diagnosis of acute pancreatitis secondary to alcohol abuse. -NPO -1 L NS bolus -LR 200 cc/hr for 18 hours -Zofran 4 mg Q6H prn -Morphine 2 mg Q3H prn -BMET in am -Lipase in am -I/Os  Hypokalemia: Patient presented with potassium of 3.2. Magnesium was low at 1.3. Patient denies chest pain. EKG in the ED showed sinus rhythm. -KDur 40 mEq once -Replace magnesium  Wheezing: Patient had diffuse wheezes on presentation. He denies any shortness of breath or cough. Patient has a history of asthma, but does not have an inhaler at home because he hasn't had an exacerbation in a long time. RR 12-19. Pulse ox 89-98% on room air.  -Albuterol Nebulizer  Alcohol Abuse: Patient admits to 1 pint of gin a day for several years. He denies every having withdrawal symptoms in the past. -CIWA protocol -Folic acid 1 mg daily -Multivitamin 1 tablet daily -thiamine 100 mg daily  DVT/PE ppx: Lovenox 40 mg SQ daily  Dispo: Disposition is deferred at this time, awaiting improvement of current medical problems. Anticipated discharge in approximately 1-2 day(s).   The patient does not have a current PCP (No Pcp Per Patient) and does need an [facility] hospital follow-up appointment after discharge.  The patient does not have transportation limitations that hinder transportation to clinic appointments.  Signed: Osa Craver, DO PGY-1 Internal Medicine Resident Pager # [phone] 12/29/2013 4:51 PM

## 2013-12-29 NOTE — Progress Notes (Signed)
Call ED to get report on pt.  Was placed on hold and then connected to a pt. Room.  Called back & report was received from [place], [place] at [phone].  Will await for pt. Arrival to [phone].

## 2013-12-29 NOTE — H&P (Signed)
Date: 12/29/2013               Patient Name:  Riley Kim MRN: [id number]  DOB: 1975-05-09 Age / Sex: 38 y.o., male   PCP: No Pcp Per Patient              Medical Service: Internal Medicine Teaching Service              Attending Physician: Dr. Aletta Edouard, MD    First Contact: Staci Righter, MS3 Pager: [phone]  Second Contact: Dr. Jill Alexanders Pager: [phone]  Third Contact Dr. Chyrel Masson Pager: [phone]       After Hours (After 5p/  First Contact Pager: [phone]  weekends / holidays): Second Contact Pager: [phone]   Chief Complaint:   "my stomach hurts really badly"  History of Present Illness: Mr. Riley Kim is a 38yo male with a history of alcohol abuse and asthma who presents today with mid-epigastric pain and nausea most likely secondary to pancreatitis. Last Thursday Mr. Riley Kim says he developed a sudden onset sore throat, headache, fever, and nausea. Shortly after this he began to notice a sharp pain around his stomach which radiates through to his back. He says the pain has been getting worse since Thursday and is now 10/10 with nothing alleviating his discomfort, he hs tried [place]. He has not experienced pain like this before. For the past few days he has been unable to eat but has been able to drink some fluids. The patient admits to drinking approximately 1 pint of gin (16oz or 10.66 servings) per day for many years. His last drink was last night and he has never experienced any withdrawal symptoms before. He denies any shortness of breath or cardiac pain.  Meds: Current Facility-Administered Medications  Medication Dose Route Frequency Provider Last Rate Last Dose  . lactated ringers infusion   Intravenous Continuous Dow Adolph, MD      . potassium chloride SA (K-DUR,KLOR-CON) CR tablet 40 mEq  40 mEq Oral Once Alexa Senaida Ores, MD      . sodium chloride 0.9 % bolus 1,000 mL  1,000 mL Intravenous Once Baltazar Apo, MD 500 mL/hr at 12/29/13 1334  1,000 mL at 12/29/13 1334   No current outpatient prescriptions on file.    Allergies: Allergies as of 12/29/2013 - Review Complete 12/29/2013  Allergen Reaction Noted  . Penicillins Other (See Comments) 09/21/2012   Past Medical History  Diagnosis Date  . Asthma   . Vertigo    Past Surgical History  Procedure Laterality Date  . Replantation thumb Right    No family history on file. History   Social History  . Marital Status: Married    Spouse Name: N/A    Number of Children: N/A  . Years of Education: N/A   Occupational History  .  [employer]   Social History Main Topics  . Smoking status: Current Every Day Smoker -- 1.00 packs/day for 20 years    Types: Cigarettes  . Smokeless tobacco: Not on file  . Alcohol Use: 112.5 oz/week    225 drink(s) per week  . Drug Use: No  . Sexual Activity: Yes   Other Topics Concern  . Not on file   Social History Narrative  . No narrative on file   Patient is married and lives at home with his wife. He has smoked 1ppd for 20 years but uses no illicit drugs. Drinks 16oz liquor/ day. Works at [employer] as a Production designer, theatre/television/film.  Review  of Systems: Pertinent items are noted in HPI.  Physical Exam: Blood pressure 137/84, pulse 89, temperature 98.1 F (36.7 C), temperature source Oral, resp. rate 18, SpO2 92.00%. General appearance: alert, cooperative and fatigued Lungs: wheezes bilaterally Heart: regular rate and rhythm, S1, S2 normal, no murmur, click, rub or gallop Abdomen: Patient's abdomen is soft with no guarding. Patient has pain with upper quadrant palpation and peritoneal signs Extremities: extremities normal, atraumatic, no cyanosis or edema  Lab results: Basic Metabolic Panel:  Recent Labs  16/10/96 0715 12/29/13 1124  NA 139  --   K 3.2*  --   CL 94*  --   CO2 26  --   GLUCOSE 103*  --   BUN 7  --   CREATININE 0.60  --   CALCIUM 10.5  --   MG  --  1.3*   Liver Function Tests:  Recent Labs  12/29/13 0715    AST 129*  ALT 41  ALKPHOS 73  BILITOT 0.5  PROT 7.6  ALBUMIN 3.8    Recent Labs  12/29/13 0715  LIPASE 815*   No results found for this basename: AMMONIA,  in the last 72 hours CBC:  Recent Labs  12/29/13 0715  WBC 10.3  NEUTROABS 7.0  HGB 12.7*  HCT 37.9*  MCV 74.3*  PLT 181   Urine Drug Screen: Drugs of Abuse     Component Value Date/Time   LABOPIA POSITIVE* 12/29/2013 1050   COCAINSCRNUR NONE DETECTED 12/29/2013 1050   LABBENZ NONE DETECTED 12/29/2013 1050   AMPHETMU NONE DETECTED 12/29/2013 1050   THCU NONE DETECTED 12/29/2013 1050   LABBARB NONE DETECTED 12/29/2013 1050    Alcohol Level:  Recent Labs  12/29/13 1124  ETH <11   Urinalysis:  Recent Labs  12/29/13 1019  COLORURINE YELLOW  LABSPEC 1.010  PHURINE 7.5  GLUCOSEU NEGATIVE  HGBUR NEGATIVE  BILIRUBINUR SMALL*  KETONESUR NEGATIVE  PROTEINUR NEGATIVE  UROBILINOGEN 0.2  NITRITE NEGATIVE  LEUKOCYTESUR NEGATIVE    Imaging results:  Ct Abdomen Pelvis W Contrast  12/29/2013   CLINICAL DATA:  Abdominal pain radiating to the back for 1 week. Vomiting.  EXAM: CT ABDOMEN AND PELVIS WITH CONTRAST  TECHNIQUE: Multidetector CT imaging of the abdomen and pelvis was performed using the standard protocol following bolus administration of intravenous contrast.  CONTRAST:  100 mL OMNIPAQUE IOHEXOL 300 MG/ML  SOLN  COMPARISON:  None.  FINDINGS: The lung bases are clear. No pleural or pericardial effusion. A juxta phrenic lymph node on the right measures 0.7 cm on image 10 is noted. No retrocrural adenopathy is seen.  There is stranding about the pancreas most consistent with pancreatitis. The pancreas enhances homogeneously. No focal fluid collection is identified. The splenic and portal veins are patent.  There is diffuse fatty infiltration of the liver without focal lesion. The gallbladder, adrenal glands, spleen, kidneys and biliary tree are unremarkable. The stomach, small and large bowel and appendix appear  normal. No abdominal or pelvic lymphadenopathy is seen. No focal bony abnormality is identified.  IMPRESSION: Findings consistent with pancreatitis without pancreatic necrosis or pseudocyst formation.  Diffuse fatty infiltration of the liver.   Electronically Signed   By: Drusilla Kanner M.D.   On: 12/29/2013 09:37    Other results: EKG: normal EKG, normal sinus rhythm.  Assessment & Plan by Problem: Principal Problem:   Alcohol-induced pancreatitis Active Problems:   Alcohol abuse   Hypokalemia   High anion gap metabolic acidosis   Elevated liver enzymes  Mild persistent asthma in adult without complication   Smoking greater than 20 pack years  Pancreatitis Mr. Maese has a history consistent with alcohol induced pancreatitis. He drinks heavily (16oz gin per day), has elevated lipase levels (815), consistent abdominal pain in the mid epigastric region that radiates to his back, and CT scan consistent with pancreatitis. The connection between The diagnosis and risk factors were discussed with the patient and he understood. The link between alcohol and pancreatitis is unclear, but likely involves the overproduction of enzymes needed for digestion. Treatment includes rest, NPO, and maintence fluids. -NPO -Odansetron  PRN -1L .9% NS Bolus -Lactated ringers /hr  Hypokalemia Patient presents with a hypokalemia of 3.2 likely due to recent vomiting and inability to consume solid food. It is a very mild deficit that can be easily corrected and should not cause adverse effects. -KDUR Once  Wheezing On exam the patient was found to have significant wheezing, but denies any associated symptoms including shortness of breath. The patients symptoms could be representative of either asthma or COPD. The patients states that he was diagnosed with asthma as a kid but has has no recent hospitalizations or has not used his inhaler for a very long time. The patient's prior diagnosis,  history of rashes, and dry cough are suggestive of asthma but his age and smoking history are concerning for COPD. There is also the possibility that the patient has newer onset COPD overlying his childhood asthma. The patient was given a nebulizer in the ER, which he states he uses frequently. Since the patient is asymptomatic the need for workup during this hospitalization is not necessary, but the patient should be followed up in a outpatient clinic upon discharge to differentiate between the two. -Albuterol 0.083%  nebulizer solution  Metabolic Acidosis Mr. Felicetti presents with a minimally elevated anion gap. The patient's respirations, sodium, chloride, and bicarb are all essentially within normal limits and his potassium is marginally low. He doe snot exhibit any signs of methanol or ethenylene glycol poisoning.The patient has an acidosis that is unexplained given his lab values and presentation. Even so, the gap is only one above normal and is not an immediate concern. Should the gap open up more a further workup would be required. The most likely explanation for his very small gap is EtOH abuse.  Pain Patient states his pain is 10/10 and he appears in moderate distress. He was given  of Morphine on admission to the ER which he said brought his pain down to 5/10.  -Morphine /ml q3 PRN  Elevated Liver Enzymes The patient presents with elevated AST  and a normal ALT . Up to Date states that for most liver conditions ALT > AST, however for alcoholic liver disease AST>ALT by >2:1. The patients laboratory values confirm the suspicion that the source of his liver abnormalities is his alcoholism.  Substance Abuse The reason for his pancreatitis, alcohol, was discussed with the patient. He states that he would like to stop drinking and now that he has physical consequences of drinking he plans to stop entirely. -Possible referral to substance abuse programs -Conversation once pain  has decreased about alcohol use  PPx -Lovenox  daily   This is a Psychologist, occupational Note.  The care of the patient was discussed with Dr. Senaida Ores and the assessment and plan was formulated with their assistance.  Please see their note for official documentation of the patient encounter.   Signed: Chiquita Loth, Med Student 12/29/2013, 1:52  PM

## 2013-12-29 NOTE — ED Notes (Signed)
Pt presents with mid abdominal pain that radiates to the back x 1 week. States last time he threw up was yesterday am. Also reports intermittent diarrhea with abdominal pain.

## 2013-12-29 NOTE — ED Provider Notes (Signed)
Medical screening examination/treatment/procedure(s) were conducted as a shared visit with non-physician practitioner(s) and myself.  I personally evaluated the patient during the encounter.   EKG Interpretation   Date/Time:  Thursday December 29 2013 12:15:44 EDT Ventricular Rate:  95 PR Interval:  137 QRS Duration: 89 QT Interval:  376 QTC Calculation: 473 R Axis:   40 Text Interpretation:  Sinus rhythm Borderline T wave abnormalities  Confirmed by [facility]  MD, TREY ([id number]) on 12/29/2013 4:50:85 PM      38 year old male presenting with epigastric abdominal pain.  On exam, well appearing, nontoxic, not distressed, normal respiratory effort, normal perfusion, abdomen soft but tender in the epigastrium, no rigidity, rebound, or guarding. Lab work consistent with acute pancreatitis. Admit to internal medicine.  Clinical Impression: 1. Alcohol-induced acute pancreatitis       Candyce Churn III, MD 12/29/13 [phone]

## 2013-12-29 NOTE — ED Provider Notes (Signed)
Medical screening examination/treatment/procedure(s) were conducted as a shared visit with non-physician practitioner(s) and myself.  I personally evaluated the patient during the encounter.   EKG Interpretation   Date/Time:  Thursday December 29 2013 12:15:44 EDT Ventricular Rate:  95 PR Interval:  137 QRS Duration: 89 QT Interval:  376 QTC Calculation: 473 R Axis:   40 Text Interpretation:  Sinus rhythm Borderline T wave abnormalities  Confirmed by [facility]  MD, TREY ([id number]) on 12/29/2013 4:50:51 PM        Candyce Churn III, MD 12/29/13 [phone]

## 2013-12-29 NOTE — ED Provider Notes (Signed)
CSN: [id number]     Arrival date & time 12/29/13  0607 History   First MD Initiated Contact with Patient 12/29/13 [phone]     Chief Complaint  Patient presents with  . Abdominal Pain     (Consider location/radiation/quality/duration/timing/severity/associated sxs/prior Treatment) Patient is a 38 y.o. male presenting with abdominal pain. The history is provided by the patient.  Abdominal Pain Pain location:  Periumbilical and epigastric Pain quality: sharp and throbbing   Pain radiates to:  Back Pain severity:  Moderate Onset quality:  Gradual Duration:  1 week Timing:  Constant Progression:  Unchanged Chronicity:  New Context: alcohol use   Context: not awakening from sleep, not diet changes, not eating, not laxative use, not medication withdrawal, not previous surgeries, not recent illness, not recent sexual activity, not recent travel, not retching, not sick contacts, not suspicious food intake and not trauma   Relieved by:  Nothing Worsened by:  Palpation Ineffective treatments:  Lying down and position changes Associated symptoms: nausea and vomiting   Associated symptoms: no anorexia, no belching, no chest pain, no chills, no constipation, no cough, no diarrhea, no dysuria, no fatigue, no fever, no hematemesis, no hematochezia, no hematuria, no melena, no shortness of breath and no sore throat   Risk factors: alcohol abuse    The patient presents to the ER with mid abdominal pain over the last week. The patient states that he has had vomiting and diarrhea as well. The patient drinks 1 pint per day.  Past Medical History  Diagnosis Date  . Asthma   . Vertigo    Past Surgical History  Procedure Laterality Date  . Replantation thumb Right    No family history on file. History  Substance Use Topics  . Smoking status: Current Every Day Smoker -- 1.00 packs/day for 20 years    Types: Cigarettes  . Smokeless tobacco: Not on file  . Alcohol Use: 2.5 oz/week    5 drink(s) per  week    Review of Systems  Constitutional: Negative for fever, chills and fatigue.  HENT: Negative for congestion and sore throat.   Respiratory: Negative for apnea, cough and shortness of breath.   Cardiovascular: Negative for chest pain.  Gastrointestinal: Positive for nausea, vomiting and abdominal pain. Negative for diarrhea, constipation, melena, hematochezia, anorexia and hematemesis.  Endocrine: Negative for polyuria.  Genitourinary: Negative for dysuria and hematuria.  Musculoskeletal: Positive for back pain and gait problem. Negative for myalgias.  Skin: Negative for rash.  Neurological: Negative for dizziness, weakness, light-headedness and headaches.    All other systems negative except as documented in the HPI. All pertinent positives and negatives as reviewed in the HPI.   Allergies  Penicillins  Home Medications   Prior to Admission medications   Not on File   BP 148/83  Pulse 88  Temp(Src) 98.1 F (36.7 C) (Oral)  Resp 17  SpO2 96% Physical Exam  Nursing note and vitals reviewed. Constitutional: He is oriented to person, place, and time. He appears well-developed and well-nourished.  HENT:  Head: Normocephalic and atraumatic.  Mouth/Throat: Oropharynx is clear and moist.  Eyes: Pupils are equal, round, and reactive to light.  Neck: Normal range of motion. Neck supple.  Cardiovascular: Normal rate, regular rhythm and normal heart sounds.  Exam reveals no gallop and no friction rub.   No murmur heard. Pulmonary/Chest: Effort normal and breath sounds normal. No respiratory distress.  Abdominal: Soft. Bowel sounds are normal. He exhibits no distension. There is tenderness. There  is no rebound and no guarding.  Neurological: He is alert and oriented to person, place, and time. He exhibits normal muscle tone. Coordination normal.  Skin: Skin is warm and dry.  Psychiatric: He has a normal mood and affect. His behavior is normal. Judgment and thought content  normal.    ED Course  Procedures (including critical care time) Labs Review Labs Reviewed  CBC WITH DIFFERENTIAL - Abnormal; Notable for the following:    Hemoglobin 12.7 (*)    HCT 37.9 (*)    MCV 74.3 (*)    MCH 24.9 (*)    Monocytes Relative 13 (*)    Monocytes Absolute 1.3 (*)    All other components within normal limits  COMPREHENSIVE METABOLIC PANEL - Abnormal; Notable for the following:    Potassium 3.2 (*)    Chloride 94 (*)    Glucose, Bld 103 (*)    AST 129 (*)    Anion gap 19 (*)    All other components within normal limits  LIPASE, BLOOD - Abnormal; Notable for the following:    Lipase 815 (*)    All other components within normal limits  URINALYSIS, ROUTINE W REFLEX MICROSCOPIC    Imaging Review Ct Abdomen Pelvis W Contrast  12/29/2013   CLINICAL DATA:  Abdominal pain radiating to the back for 1 week. Vomiting.  EXAM: CT ABDOMEN AND PELVIS WITH CONTRAST  TECHNIQUE: Multidetector CT imaging of the abdomen and pelvis was performed using the standard protocol following bolus administration of intravenous contrast.  CONTRAST:  100 mL OMNIPAQUE IOHEXOL 300 MG/ML  SOLN  COMPARISON:  None.  FINDINGS: The lung bases are clear. No pleural or pericardial effusion. A juxta phrenic lymph node on the right measures 0.7 cm on image 10 is noted. No retrocrural adenopathy is seen.  There is stranding about the pancreas most consistent with pancreatitis. The pancreas enhances homogeneously. No focal fluid collection is identified. The splenic and portal veins are patent.  There is diffuse fatty infiltration of the liver without focal lesion. The gallbladder, adrenal glands, spleen, kidneys and biliary tree are unremarkable. The stomach, small and large bowel and appendix appear normal. No abdominal or pelvic lymphadenopathy is seen. No focal bony abnormality is identified.  IMPRESSION: Findings consistent with pancreatitis without pancreatic necrosis or pseudocyst formation.  Diffuse fatty  infiltration of the liver.   Electronically Signed   By: Drusilla Kanner M.D.   On: 12/29/2013 09:37   The patient will be admitted for further care and evaluation. Told of the plan and all questions were answered.     Carlyle Dolly, PA-C 12/29/13 1520

## 2013-12-29 NOTE — Progress Notes (Signed)
Riley Kim [id number] Admitted to 4U98: 12/29/2013 5:58 PM Attending Provider: Aletta Edouard, MD    Riley Kim is a 38 y.o. male patient admitted from ED awake, alert  & orientated  X 3,  Full Code, VSS - Blood pressure 128/83, pulse 84, temperature 98.2 F (36.8 C), temperature source Oral, resp. rate 16, height  (1.88 m), SpO2 100.00%., R/A, no c/o shortness of breath, no c/o chest pain, no distress noted. Non-Tele.   IV site WDL:  antecubital right, condition patent and no redness with a transparent dsg that's clean dry and intact.  Allergies:   Allergies  Allergen Reactions  . Penicillins Other (See Comments)    Rxn: unknown     Past Medical History  Diagnosis Date  . Asthma   . Vertigo   .  Pt orientation to unit, room and routine. Information packet given to patient/family and safety video watched.  Admission INP armband ID verified with patient/family, and in place. SR up x 2, fall risk assessment complete with Patient and family verbalizing understanding of risks associated with falls. Pt verbalizes an understanding of how to use the call bell and to call for help before getting out of bed.  Skin, clean-dry- intact without evidence of bruising, or skin tears.   No evidence of skin break down noted on exam.    Will cont to monitor and assist as needed.  Joana Reamer, RN 12/29/2013 5:58 PM

## 2013-12-30 DIAGNOSIS — J45909 Unspecified asthma, uncomplicated: Secondary | ICD-10-CM

## 2013-12-30 DIAGNOSIS — E876 Hypokalemia: Secondary | ICD-10-CM

## 2013-12-30 LAB — LIPASE, BLOOD: Lipase: 276 U/L — ABNORMAL HIGH (ref 11–59)

## 2013-12-30 LAB — COMPREHENSIVE METABOLIC PANEL
ALT: 25 U/L (ref 0–53)
AST: 52 U/L — ABNORMAL HIGH (ref 0–37)
Albumin: 3.2 g/dL — ABNORMAL LOW (ref 3.5–5.2)
Alkaline Phosphatase: 70 U/L (ref 39–117)
Anion gap: 16 — ABNORMAL HIGH (ref 5–15)
BILIRUBIN TOTAL: 0.8 mg/dL (ref 0.3–1.2)
BUN: 5 mg/dL — AB (ref 6–23)
CALCIUM: 9.3 mg/dL (ref 8.4–10.5)
CO2: 28 mEq/L (ref 19–32)
CREATININE: 0.61 mg/dL (ref 0.50–1.35)
Chloride: 94 mEq/L — ABNORMAL LOW (ref 96–112)
GFR calc non Af Amer: >90 mL/min (ref >90)
GLUCOSE: 74 mg/dL (ref 70–99)
Potassium: 3.5 mEq/L — ABNORMAL LOW (ref 3.7–5.3)
Sodium: 138 mEq/L (ref 137–147)
Total Protein: 6.7 g/dL (ref 6.0–8.3)

## 2013-12-30 MED ORDER — LACTATED RINGERS IV SOLN
INTRAVENOUS | Status: AC
  Administered 2013-12-30 (×2): via INTRAVENOUS

## 2013-12-30 MED ORDER — POTASSIUM CHLORIDE 10 MEQ/100ML IV SOLN
10.0000 meq | INTRAVENOUS | Status: AC
  Administered 2013-12-30 (×4): 10 meq via INTRAVENOUS
  Filled 2013-12-30 (×4): qty 100

## 2013-12-30 MED ORDER — ALBUTEROL SULFATE (2.5 MG/3ML) 0.083% IN NEBU: 3.0000 mL | INHALATION_SOLUTION | RESPIRATORY_TRACT | Status: DC | PRN

## 2013-12-30 MED ORDER — MAGNESIUM SULFATE 40 MG/ML IJ SOLN
2.0000 g | Freq: Once | INTRAMUSCULAR | Status: AC
  Administered 2013-12-30: 2 g via INTRAVENOUS
  Filled 2013-12-30: qty 50

## 2013-12-30 MED ORDER — DIPHENHYDRAMINE HCL 50 MG/ML IJ SOLN
12.5000 mg | Freq: Once | INTRAMUSCULAR | Status: AC
  Administered 2013-12-30: 12.5 mg via INTRAVENOUS
  Filled 2013-12-30: qty 1

## 2013-12-30 NOTE — Progress Notes (Signed)
Subjective:  Patient was seen and examined this morning. Patient continues to complain of epigastric pain, improved for yesterday, at about at 4/10. He has been NPO and eating ice chips. He is hungry and requesting to eat. Patient has not been asking for much pain medication. He states he has pain, but is not a "big medicine taker." He is aware that he can ask for pain medication for when his pain is intolerable. He denies nausea, vomiting or diarrhea. He states he did not sleep well last night, but attributes it to the bed. He denies any headache, dizziness, chest pain, or shortness of breath. Patient admits he has wheezes at baseline, but does not ever feel short of breath. The nebulizer helped him yesterday, but he does not want another one today.  Objective: Vital signs in last 24 hours: Filed Vitals:   12/29/13 1721 12/29/13 2125 12/30/13 0022 12/30/13 0733  BP: 128/83 152/107 144/98 135/87  Pulse: 84 86 91 85  Temp: 98.2 F (36.8 C) 98.7 F (37.1 C) 99.3 F (37.4 C) 98.4 F (36.9 C)  TempSrc: Oral Oral Oral Oral  Resp: Height:  (1.88 m)     Weight: 101.1 kg (222 lb 14.2 oz)     SpO2: 100% 100% 100% 98%   Weight change:   Intake/Output Summary (Last 24 hours) at 12/30/13 1110 Last data filed at 12/30/13 0900  Gross per 24 hour  Intake      0 ml  Output      0 ml  Net      0 ml    General: Vital signs reviewed.  Patient is well-developed and well-nourished, in no acute distress and cooperative with exam.  Cardiovascular: RRR, S1 normal, S2 normal, no murmurs, gallops, or rubs. Pulmonary/Chest: Mild-moderate expiratory wheezes heard loudest in the upper lung fields. No rales or rhonchi. Abdominal: Soft, tender to palpation in the RUQ, LUQ and worst in the epigastric region. No guarding. Non-distended, BS +, no masses, or organomegaly. No periumbilical ecchymosis.  Musculoskeletal: No joint deformities, erythema, or stiffness, ROM full and  nontender. Extremities: No lower extremity edema bilaterally,  pulses symmetric and intact bilaterally. No cyanosis or clubbing. Skin: Warm, dry and intact. No rashes or erythema. Psychiatric: Normal mood and affect. speech and behavior is normal. Cognition and memory are normal.   Lab Results: Basic Metabolic Panel:  Recent Labs Lab 12/29/13 0715 12/29/13 1124 12/30/13 0738  NA 139  --  138  K 3.2*  --  3.5*  CL 94*  --  94*  CO2 26  --  28  GLUCOSE 103*  --  74  BUN 7  --  5*  CREATININE 0.60  --  0.61  CALCIUM 10.5  --  9.3  MG  --  1.3*  --    Liver Function Tests:  Recent Labs Lab 12/29/13 0715 12/30/13 0738  AST 129* 52*  ALT 41 25  ALKPHOS 73 70  BILITOT 0.5 0.8  PROT 7.6 6.7  ALBUMIN 3.8 3.2*    Recent Labs Lab 12/29/13 0715 12/30/13 0738  LIPASE 815* 276*   CBC:  Recent Labs Lab 12/29/13 0715  WBC 10.3  NEUTROABS 7.0  HGB 12.7*  HCT 37.9*  MCV 74.3*  PLT 181   Fasting Lipid Panel:  Recent Labs Lab 12/29/13 0715  CHOL 172  HDL 85  LDLCALC 72  TRIG 77  CHOLHDL 2.0   Urine Drug Screen: Drugs of Abuse  Component Value Date/Time   LABOPIA POSITIVE* 12/29/2013 1050   COCAINSCRNUR NONE DETECTED 12/29/2013 1050   LABBENZ NONE DETECTED 12/29/2013 1050   AMPHETMU NONE DETECTED 12/29/2013 1050   THCU NONE DETECTED 12/29/2013 1050   LABBARB NONE DETECTED 12/29/2013 1050    Alcohol Level:  Recent Labs Lab 12/29/13 1124  ETH <11   Urinalysis:  Recent Labs Lab 12/29/13 1019  COLORURINE YELLOW  LABSPEC 1.010  PHURINE 7.5  GLUCOSEU NEGATIVE  HGBUR NEGATIVE  BILIRUBINUR SMALL*  KETONESUR NEGATIVE  PROTEINUR NEGATIVE  UROBILINOGEN 0.2  NITRITE NEGATIVE  LEUKOCYTESUR NEGATIVE   Studies/Results: Ct Abdomen Pelvis W Contrast  12/29/2013   CLINICAL DATA:  Abdominal pain radiating to the back for 1 week. Vomiting.  EXAM: CT ABDOMEN AND PELVIS WITH CONTRAST  TECHNIQUE: Multidetector CT imaging of the abdomen and pelvis was performed  using the standard protocol following bolus administration of intravenous contrast.  CONTRAST:  100 mL OMNIPAQUE IOHEXOL 300 MG/ML  SOLN  COMPARISON:  None.  FINDINGS: The lung bases are clear. No pleural or pericardial effusion. A juxta phrenic lymph node on the right measures 0.7 cm on image 10 is noted. No retrocrural adenopathy is seen.  There is stranding about the pancreas most consistent with pancreatitis. The pancreas enhances homogeneously. No focal fluid collection is identified. The splenic and portal veins are patent.  There is diffuse fatty infiltration of the liver without focal lesion. The gallbladder, adrenal glands, spleen, kidneys and biliary tree are unremarkable. The stomach, small and large bowel and appendix appear normal. No abdominal or pelvic lymphadenopathy is seen. No focal bony abnormality is identified.  IMPRESSION: Findings consistent with pancreatitis without pancreatic necrosis or pseudocyst formation.  Diffuse fatty infiltration of the liver.   Electronically Signed   By: Drusilla Kanner M.D.   On: 12/29/2013 09:37   Medications: I have reviewed the patient's current medications.  No prescriptions prior to admission    Scheduled Meds: . enoxaparin (LOVENOX) injection  40 mg Subcutaneous Q24H  . folic acid  1 mg Oral Daily  . LORazepam  0-4 mg Oral Q6H   Followed by  . [START ON 12/31/2013] LORazepam  0-4 mg Oral Q12H  . multivitamin with minerals  1 tablet Oral Daily  . nicotine  21 mg Transdermal Daily  . potassium chloride  10 mEq Intravenous Q1 Hr x 4  . thiamine  100 mg Oral Daily   Or  . thiamine  100 mg Intravenous Daily   Continuous Infusions: . lactated ringers     PRN Meds:.LORazepam, LORazepam, morphine injection, ondansetron (ZOFRAN) IV, ondansetron Assessment/Plan:  Alcohol-Induced Acute Pancreatitis: Patient presented with abdominal pain, nausea, vomiting and diarrhea.  Lipase is was 815 on admission. Alcohol level <11. CT Abdomen/Pelvis showed  stranding about the pancreas most consistent with pancreatitis without pancreatitis necrosis or pseudocyst formation. Patient only had 2 x 2 mg of morphine during the day and through the night. Patient is still in significant pain this morning, although improved from yesterday. Down from a 10/10 to 4-5/10. He is requesting to eat, but I feel that it is too soon. We will continue his IVF, pain control, ice chips, and then reassess at lunchtime.   -NPO  -We will consider advancing to clear liquid diet for lunch or dinner based on how patient is doing clinically -LR 200 cc/hr   -Zofran 4 mg Q6H prn  -Morphine 2 mg Q3H prn  -BMET/CBC in am  -I/Os   Hypokalemia: Potassium this morning was 3.5.  -  Replace magnesium -Replace potassium with 10 mEq in 100 mL IVPB over 60 minutes x 4, for a total of 40 mEq in 4 hours -BMET tomorrow am  Asthma: Patient had diffuse wheezes on presentation. Patient received nebulizer in the ED. Pulse ox has been 100% on room air. Patient is still wheezing on exam, but denies shortness of breath. He does not want a nebulizer. This is likely his baseline, but may be exacerbated by pancreatitis.  -Pulse ox -Vital signs  Alcohol Abuse: Patient admits to 1 pint of gin a day for several years. CIWA scores have been zero. -CIWA protocol  -Folic acid 1 mg daily  -Multivitamin 1 tablet daily  -thiamine 100 mg daily   Tobacco Abuse: Patient admits to smoking 1 ppd for 20 years. He requested a nicotine patch. -Nicotine patch  DVT/PE ppx: Lovenox 40 mg SQ daily  Dispo: Disposition is deferred at this time, awaiting improvement of current medical problems.  Anticipated discharge in approximately 1-2 day(s).   The patient does not have a current PCP (No Pcp Per Patient) and does need an [facility] hospital follow-up appointment after discharge.  The patient does not have transportation limitations that hinder transportation to clinic appointments.  .Services Needed at time of  discharge: Y = Yes, Blank = No PT:   OT:   RN:   Equipment:   Other:     LOS: 1 day   Jill Alexanders, DO PGY-1 Internal Medicine Resident Pager # [phone] 12/30/2013 11:10 AM

## 2013-12-30 NOTE — H&P (Signed)
INTERNAL MEDICINE TEACHING ATTENDING NOTE  Day 1 of stay  Patient name: Riley Kim  MRN: [id number] Date of birth: 1975/06/13   Key clinical points and exam                                                          38 y.o.with alcoholic pancreatitis. Pain better this morning, however still not ready for oral intake. No more nausea or vomiting. Examined patient - tenderness to palpation over epigastric area. Vitals stable. I have reviewed the chart, lab results, EKG, imaging and relevant notes of this patient.   Assessment and Plan                                                                      Will continue fluids. Will start diet when patient clinically better. Pain control as needed. Counseled about abstinence. Patient agreeable. Social worker consult.   I have seen and evaluated this patient and discussed it with my IM resident team.  Please see the rest of the plan per resident note from today.   Naida Escalante 12/30/2013, 1:24 PM.

## 2013-12-30 NOTE — Progress Notes (Signed)
  I have seen and examined the patient, and reviewed the daily progress note by Staci Righter, MS III and discussed the care of the patient with them. Please see my progress note from 12/30/2013 for further details regarding assessment and plan.    Signed:  Jill Alexanders, DO PGY-1 Internal Medicine Resident Pager # [phone] 12/30/2013 3:11 PM

## 2013-12-30 NOTE — Progress Notes (Signed)
Subjective: Riley Kim is a 38yo male on hospital day 2 with a history of alcohol abuse and asthma who presents today with mid-epigastric pain and nausea secondary to pancreatitis. This morning he said that he was feeling better, but still experiencing moderate abdominal pain at a level of 5-6/10. He states that he had had trouble sleeping overnight but that the morphine helped with that and his abdominal pain. He denies any nausea, vomiting, shortness of breath,or diarrhea but endorses some mild wheezing. He was not defecated but has been urinating. By noon Riley Kim stated that his pain had significantly improved since the morning in both severity and frequency.  Objective: Vital signs in last 24 hours: Filed Vitals:   12/29/13 1721 12/29/13 2125 12/30/13 0022 12/30/13 0733  BP: 128/83 152/107 144/98 135/87  Pulse: 84 86 91 85  Temp: 98.2 F (36.8 C) 98.7 F (37.1 C) 99.3 F (37.4 C) 98.4 F (36.9 C)  TempSrc: Oral Oral Oral Oral  Resp: Height:  (1.88 m)     Weight: 101.1 kg (222 lb 14.2 oz)     SpO2: 100% 100% 100% 98%   Weight change:   Intake/Output Summary (Last 24 hours) at 12/30/13 1353 Last data filed at 12/30/13 0900  Gross per 24 hour  Intake      0 ml  Output      0 ml  Net      0 ml   General appearance: cooperative and fatigued Lungs: wheezes bilaterally Heart: regular rate and rhythm, S1, S2 normal, no murmur, click, rub or gallop Abdomen: diffusely tender to palpation with some guarding especially in the mid epigastric region Lab Results: Basic Metabolic Panel:  Recent Labs Lab 12/29/13 0715 12/29/13 1124 12/30/13 0738  NA 139  --  138  K 3.2*  --  3.5*  CL 94*  --  94*  CO2 26  --  28  GLUCOSE 103*  --  74  BUN 7  --  5*  CREATININE 0.60  --  0.61  CALCIUM 10.5  --  9.3  MG  --  1.3*  --    Liver Function Tests:  Recent Labs Lab 12/29/13 0715 12/30/13 0738  AST 129* 52*  ALT 41 25  ALKPHOS 73 70  BILITOT 0.5 0.8    PROT 7.6 6.7  ALBUMIN 3.8 3.2*    Recent Labs Lab 12/29/13 0715 12/30/13 0738  LIPASE 815* 276*   No results found for this basename: AMMONIA,  in the last 168 hours CBC:  Recent Labs Lab 12/29/13 0715  WBC 10.3  NEUTROABS 7.0  HGB 12.7*  HCT 37.9*  MCV 74.3*  PLT 181   Fasting Lipid Panel:  Recent Labs Lab 12/29/13 0715  CHOL 172  HDL 85  LDLCALC 72  TRIG 77  CHOLHDL 2.0   Urine Drug Screen: Drugs of Abuse     Component Value Date/Time   LABOPIA POSITIVE* 12/29/2013 1050   COCAINSCRNUR NONE DETECTED 12/29/2013 1050   LABBENZ NONE DETECTED 12/29/2013 1050   AMPHETMU NONE DETECTED 12/29/2013 1050   THCU NONE DETECTED 12/29/2013 1050   LABBARB NONE DETECTED 12/29/2013 1050    Alcohol Level:  Recent Labs Lab 12/29/13 1124  ETH <11   Urinalysis:  Recent Labs Lab 12/29/13 1019  COLORURINE YELLOW  LABSPEC 1.010  PHURINE 7.5  GLUCOSEU NEGATIVE  HGBUR NEGATIVE  BILIRUBINUR SMALL*  KETONESUR NEGATIVE  PROTEINUR NEGATIVE  UROBILINOGEN 0.2  NITRITE NEGATIVE  LEUKOCYTESUR NEGATIVE  Studies/Results: Ct Abdomen Pelvis W Contrast  12/29/2013   CLINICAL DATA:  Abdominal pain radiating to the back for 1 week. Vomiting.  EXAM: CT ABDOMEN AND PELVIS WITH CONTRAST  TECHNIQUE: Multidetector CT imaging of the abdomen and pelvis was performed using the standard protocol following bolus administration of intravenous contrast.  CONTRAST:  100 mL OMNIPAQUE IOHEXOL 300 MG/ML  SOLN  COMPARISON:  None.  FINDINGS: The lung bases are clear. No pleural or pericardial effusion. A juxta phrenic lymph node on the right measures 0.7 cm on image 10 is noted. No retrocrural adenopathy is seen.  There is stranding about the pancreas most consistent with pancreatitis. The pancreas enhances homogeneously. No focal fluid collection is identified. The splenic and portal veins are patent.  There is diffuse fatty infiltration of the liver without focal lesion. The gallbladder, adrenal glands,  spleen, kidneys and biliary tree are unremarkable. The stomach, small and large bowel and appendix appear normal. No abdominal or pelvic lymphadenopathy is seen. No focal bony abnormality is identified.  IMPRESSION: Findings consistent with pancreatitis without pancreatic necrosis or pseudocyst formation.  Diffuse fatty infiltration of the liver.   Electronically Signed   By: Drusilla Kanner M.D.   On: 12/29/2013 09:37   Medications: I have reviewed the patient's current medications. Scheduled Meds: . enoxaparin (LOVENOX) injection  40 mg Subcutaneous Q24H  . folic acid  1 mg Oral Daily  . LORazepam  0-4 mg Oral Q6H   Followed by  . [START ON 12/31/2013] LORazepam  0-4 mg Oral Q12H  . magnesium sulfate 1 - 4 g bolus IVPB  2 g Intravenous Once  . multivitamin with minerals  1 tablet Oral Daily  . nicotine  21 mg Transdermal Daily  . potassium chloride  10 mEq Intravenous Q1 Hr x 4  . thiamine  100 mg Oral Daily   Or  . thiamine  100 mg Intravenous Daily   Continuous Infusions: . lactated ringers     PRN Meds:.LORazepam, LORazepam, morphine injection, ondansetron (ZOFRAN) IV, ondansetron Assessment/Plan: Principal Problem:   Alcohol-induced pancreatitis Active Problems:   Alcohol abuse   Hypokalemia   High anion gap metabolic acidosis   Elevated liver enzymes   Mild persistent asthma in adult without complication   Smoking greater than 20 pack years   Acute pancreatitis  Pancreatitis  Riley Kim has a history and exam consistent with pancreatitis. The connection between alcohol and his admission were discussed and the patient understands the implications of drinking in excess. He is recovering normally. Given his improvement in pain since the morning a clear liquid diet is appropriate with plans to progress to soft as tolerated.   -Clear liquid diet -Odansetron  PRN  -Lactated ringers /hr   Hypokalemia/ Hypomagnesemia Patient has a potassium of 3.5 today. Needs some  minimal additional replacement K therapy.  Additionally the patient has a low magnesium of 1.3. Hypomagnesemia is often associated with hypokalemia. Will need replacement therapy. -KCl in IV every 1hr x4 -Magnesium sulfate IVPB 2g/ 50ml Once  Wheezing  On exam the patient was found to have continued moderate wheezing, but denies any associated symptoms including shortness of breath. The patients symptoms could be representative of either asthma or COPD.  The nebulizer treatment in the ER yesterday improved his O2 saturation to 100%. Outpatient follow up is advised to complete the work-up for this condition. The patient currently has no PCP but expressed interest in obtaining one here at [facility].  Metabolic Acidosis  Anion  gap is minimally elevated at 16, not a concern. The most likely explanation for his very small gap is EtOH abuse.   Pain  Patient states his pain is well controlled at 4/10 by  morphine q3 prn. Since yesterday afternoon he has only requested 4 administrations. -Morphine /ml q3 PRN   Elevated Liver Enzymes  The patient continues to have a minimally elevated AST  and a normal ALT . The patients laboratory values confirm the suspicion that the source of his liver abnormalities is his alcoholism. This issue is not a concern.  Substance Abuse  The reason for his pancreatitis, alcohol, was discussed with the patient. The patient reiterated today that he would like to stop drinking completely. His wife was present for the conversation and was very supportive. Patient is a smoker and needs a nicotine patch to help with cravings. -Social work referral Nicotine  transdermal daily  PPx  -Lovenox  daily   This is a Psychologist, occupational Note.  The care of the patient was discussed with Dr. Senaida Ores and the assessment and plan formulated with their assistance.  Please see their attached note for official documentation of the daily encounter.   LOS: 1 day    Chiquita Loth, Med Student 12/30/2013, 1:53 PM

## 2013-12-30 NOTE — Progress Notes (Signed)
Utilization review completed. Araeya Lamb, RN, BSN. 

## 2013-12-30 NOTE — Progress Notes (Signed)
Patient's diastolic BP elevated at 147/103.  Prior BP at noon showed 160/104.  Informed Dr. Allena Katz.  Will consult senior and place orders if necessary.

## 2013-12-31 DIAGNOSIS — F17200 Nicotine dependence, unspecified, uncomplicated: Secondary | ICD-10-CM

## 2013-12-31 DIAGNOSIS — K859 Acute pancreatitis without necrosis or infection, unspecified: Principal | ICD-10-CM

## 2013-12-31 DIAGNOSIS — F1010 Alcohol abuse, uncomplicated: Secondary | ICD-10-CM

## 2013-12-31 DIAGNOSIS — F1910 Other psychoactive substance abuse, uncomplicated: Secondary | ICD-10-CM

## 2013-12-31 DIAGNOSIS — I10 Essential (primary) hypertension: Secondary | ICD-10-CM

## 2013-12-31 LAB — CBC WITH DIFFERENTIAL/PLATELET
BASOS ABS: 0.1 10*3/uL (ref 0.0–0.1)
Basophils Relative: 1 % (ref 0–1)
EOS ABS: 0.4 10*3/uL (ref 0.0–0.7)
EOS PCT: 6 % — AB (ref 0–5)
HCT: 35.5 % — ABNORMAL LOW (ref 39.0–52.0)
Hemoglobin: 11.7 g/dL — ABNORMAL LOW (ref 13.0–17.0)
LYMPHS ABS: 1.3 10*3/uL (ref 0.7–4.0)
LYMPHS PCT: 19 % (ref 12–46)
MCH: 24.4 pg — AB (ref 26.0–34.0)
MCHC: 33 g/dL (ref 30.0–36.0)
MCV: 74.1 fL — AB (ref 78.0–100.0)
Monocytes Absolute: 1 10*3/uL (ref 0.1–1.0)
Monocytes Relative: 15 % — ABNORMAL HIGH (ref 3–12)
Neutro Abs: 4 10*3/uL (ref 1.7–7.7)
Neutrophils Relative %: 59 % (ref 43–77)
Platelets: 157 10*3/uL (ref 150–400)
RBC: 4.79 MIL/uL (ref 4.22–5.81)
RDW: 14.6 % (ref 11.5–15.5)
WBC: 6.7 10*3/uL (ref 4.0–10.5)

## 2013-12-31 LAB — MAGNESIUM: Magnesium: 1.8 mg/dL (ref 1.5–2.5)

## 2013-12-31 LAB — BASIC METABOLIC PANEL
Anion gap: 14 (ref 5–15)
BUN: 6 mg/dL (ref 6–23)
CALCIUM: 9.7 mg/dL (ref 8.4–10.5)
CO2: 26 mEq/L (ref 19–32)
Chloride: 97 mEq/L (ref 96–112)
Creatinine, Ser: 0.6 mg/dL (ref 0.50–1.35)
GFR calc non Af Amer: >90 mL/min (ref >90)
GLUCOSE: 80 mg/dL (ref 70–99)
POTASSIUM: 3.7 meq/L (ref 3.7–5.3)
SODIUM: 137 meq/L (ref 137–147)

## 2013-12-31 MED ORDER — LISINOPRIL 10 MG PO TABS: 10.0000 mg | ORAL_TABLET | Freq: Every day | ORAL | Status: DC

## 2013-12-31 NOTE — Discharge Summary (Signed)
Name: Riley Kim MRN: [id number] DOB: Nov 22, 1975 38 y.o. PCP: No Pcp Per Patient  Date of Admission: 12/29/2013  6:09 AM Date of Discharge: 12/31/2013 Attending Physician: Aletta Edouard, MD  Discharge Diagnosis:  Principal Problem:   Alcohol-induced pancreatitis Active Problems:   Alcohol abuse   Hypokalemia   High anion gap metabolic acidosis   Elevated liver enzymes   Mild persistent asthma in adult without complication   Smoking greater than 20 pack years   Acute pancreatitis   Essential hypertension  Discharge Medications:   Medication List         lisinopril 10 MG tablet  Commonly known as:  PRINIVIL,ZESTRIL  Take 1 tablet (10 mg total) by mouth daily.        Disposition and follow-up:   Mr.Garen S Pinkstaff was discharged from [facility] in Good condition.  At the hospital follow up visit please address:  1.  Hypertension: Started him on Lisinopril 10 mg daily. Please do a BMP  2.  Labs / imaging needed at time of follow-up: none  3.  Pending labs/ test needing follow-up: BMP  Follow-up Appointments: Follow-up Information   Follow up with Ky Barban, MD On 01/05/2014. (10:15am)    Specialty:  Internal Medicine   Contact information:   [address] [place] [address] [phone]       Discharge Instructions: Discharge Instructions   Call MD for:  persistant dizziness or light-headedness    Complete by:  As directed      Call MD for:  severe uncontrolled pain    Complete by:  As directed      Call MD for:  temperature >100.4    Complete by:  As directed      Diet - low sodium heart healthy    Complete by:  As directed      Increase activity slowly    Complete by:  As directed            Consultations:  none  Procedures Performed:  Ct Abdomen Pelvis W Contrast  12/29/2013   CLINICAL DATA:  Abdominal pain radiating to the back for 1 week. Vomiting.  EXAM: CT ABDOMEN AND PELVIS WITH CONTRAST  TECHNIQUE:  Multidetector CT imaging of the abdomen and pelvis was performed using the standard protocol following bolus administration of intravenous contrast.  CONTRAST:  100 mL OMNIPAQUE IOHEXOL 300 MG/ML  SOLN  COMPARISON:  None.  FINDINGS: The lung bases are clear. No pleural or pericardial effusion. A juxta phrenic lymph node on the right measures 0.7 cm on image 10 is noted. No retrocrural adenopathy is seen.  There is stranding about the pancreas most consistent with pancreatitis. The pancreas enhances homogeneously. No focal fluid collection is identified. The splenic and portal veins are patent.  There is diffuse fatty infiltration of the liver without focal lesion. The gallbladder, adrenal glands, spleen, kidneys and biliary tree are unremarkable. The stomach, small and large bowel and appendix appear normal. No abdominal or pelvic lymphadenopathy is seen. No focal bony abnormality is identified.  IMPRESSION: Findings consistent with pancreatitis without pancreatic necrosis or pseudocyst formation.  Diffuse fatty infiltration of the liver.   Electronically Signed   By: Drusilla Kanner M.D.   On: 12/29/2013 09:37      Admission HPI:  Chief Complaint: abdominal pain, diarrhea, vomiting   History of Present Illness: Mr. Broughton is a 38 yo male with PMHx of alcohol abuse and asthma who presented to the  ED with complaint of epigastric pain, nausea, vomiting and diarrhea. Patient stated the epigastric pain started about one week ago and has been worsening. The pain is sharp, constant and located in mid epigastric region and radiating to his back. Nothing makes it better or worse. He denies ever having this type of pain before. Morphine brings his pain down to a 4/10. Patient admits to drinking 1 pint of gin a day for several years. He has never experienced withdrawal symptoms; however, he has never gone a long period of time without drinking. He also smokes 1 ppd for 20 years. He denies any illicit drug use. He  denies any family history of pancreatitis or liver disease. Patient is not on any medications at home. He does not have an inhaler at home for his asthma because he states he doesn't need it and has not had an asthma exacerbation in a long time. He admits to epigastric pain, nausea, vomiting and diarrhea. He denies headache, dizziness, chest pain, or shortness of breath.    Hospital Course by problem list: Principal Problem:   Alcohol-induced pancreatitis Active Problems:   Alcohol abuse   Hypokalemia   High anion gap metabolic acidosis   Elevated liver enzymes   Mild persistent asthma in adult without complication   Smoking greater than 20 pack years   Acute pancreatitis   Essential hypertension   Acute alcohol induced Pancreatitis: Resolved. On admission his lipase was elevated to > 800. Abdominal CT scan revealed acute pancreatitis without complications. Patient has an extensive history of alcohol abuse. He was treated with IV fluids, and IV pain medications, in addition to bowel rest. After 2 days of hospitalization, his clinical status improved and he had been escalated to a regular diet, which he tolerated. He only reported minimal abdominal pain , and he was encouraged to use over-the-counter acetaminophen for pain. He will followup as outpatient to receive counseling for alcohol cessation.   Hypertension:  he did not have a diagnosis of essential hypertension. However, during his hospitalization, his blood pressure was noted to be elevated to as high as 160/104 mmHg. He was discharged on lisinopril 10 mg daily.  Her basic metabolic panel will be performed during his outpatient follow up to assess for potassium level.  Substance Abuse - alcohol and tobacco: interested in quitting alcohol. Not ready to stop cigarettes.  Will cont with CSW assistance at outpatient follow up   Discharge Vitals:   BP 147/99  Pulse 77  Temp(Src) 98.8 F (37.1 C) (Oral)  Resp 18  Ht  (1.88 m)  Wt  222 lb 14.2 oz (101.1 kg)  BMI 28.60 kg/m2  SpO2 100%  Discharge Labs:  Results for orders placed during the hospital encounter of 12/29/13 (from the past 24 hour(s))  BASIC METABOLIC PANEL     Status: None   Collection Time    12/31/13  6:21 AM      Result Value Ref Range   Sodium 137  137 - 147 mEq/L   Potassium 3.7  3.7 - 5.3 mEq/L   Chloride 97  96 - 112 mEq/L   CO2 26  19 - 32 mEq/L   Glucose, Bld 80  70 - 99 mg/dL   BUN 6  6 - 23 mg/dL   Creatinine, Ser 6.04  0.50 - 1.35 mg/dL   Calcium 9.7  8.4 - 54.0 mg/dL   GFR calc non Af Amer >90  >90 mL/min   GFR calc Af Amer >90  >90  mL/min   Anion gap 14  5 - 15  CBC WITH DIFFERENTIAL     Status: Abnormal   Collection Time    12/31/13  6:21 AM      Result Value Ref Range   WBC 6.7  4.0 - 10.5 K/uL   RBC 4.79  4.22 - 5.81 MIL/uL   Hemoglobin 11.7 (*) 13.0 - 17.0 g/dL   HCT 54.0 (*) 98.1 - 19.1 %   MCV 74.1 (*) 78.0 - 100.0 fL   MCH 24.4 (*) 26.0 - 34.0 pg   MCHC 33.0  30.0 - 36.0 g/dL   RDW 47.8  29.5 - 62.1 %   Platelets 157  150 - 400 K/uL   Neutrophils Relative % 59  43 - 77 %   Neutro Abs 4.0  1.7 - 7.7 K/uL   Lymphocytes Relative 19  12 - 46 %   Lymphs Abs 1.3  0.7 - 4.0 K/uL   Monocytes Relative 15 (*) 3 - 12 %   Monocytes Absolute 1.0  0.1 - 1.0 K/uL   Eosinophils Relative 6 (*) 0 - 5 %   Eosinophils Absolute 0.4  0.0 - 0.7 K/uL   Basophils Relative 1  0 - 1 %   Basophils Absolute 0.1  0.0 - 0.1 K/uL  MAGNESIUM     Status: None   Collection Time    12/31/13  6:21 AM      Result Value Ref Range   Magnesium 1.8  1.5 - 2.5 mg/dL    Signed: Dow Adolph, MD 12/31/2013, 8:24 AM    Services Ordered on Discharge: none Equipment Ordered on Discharge: none

## 2013-12-31 NOTE — Progress Notes (Signed)
Riley Kim discharged Home per MD order.  Discharge instructions reviewed and discussed with the patient, all questions and concerns answered. Copy of instructions and care notes for lisinopril, pancreatitis & smoking cessation given to patient.    Medication List         lisinopril 10 MG tablet  Commonly known as:  PRINIVIL,ZESTRIL  Take 1 tablet (10 mg total) by mouth daily.        Patients skin is clean, dry and intact, no evidence of skin break down. IV site discontinued and catheter remains intact. Site without signs and symptoms of complications. Dressing and pressure applied.  Patient escorted to car by NT ambulating  no distress noted upon discharge.  Laural Benes, Saray Capasso C 12/31/2013 11:20 AM

## 2013-12-31 NOTE — Discharge Instructions (Signed)
Thank you for allowing [place] to be involved in your healthcare while you were hospitalized at [facility].   Please note that there have been changes to your home medications.  --> PLEASE LOOK AT YOUR DISCHARGE MEDICATION LIST FOR DETAILS.  Please start taking Lisinopril 10 mg daily for high blood pressure.   Please call your PCP if you have any questions or concerns, or any difficulty getting any of your medications.  Please return to the ER if you have worsening of your symptoms or new severe symptoms arise.   Acute Pancreatitis Acute pancreatitis is a disease in which the pancreas becomes suddenly inflamed. The pancreas is a large gland located behind your stomach. The pancreas produces enzymes that help digest food. The pancreas also releases the hormones glucagon and insulin that help regulate blood sugar. Damage to the pancreas occurs when the digestive enzymes from the pancreas are activated and begin attacking the pancreas before being released into the intestine. Most acute attacks last a couple of days and can cause serious complications. Some people become dehydrated and develop low blood pressure. In severe cases, bleeding into the pancreas can lead to shock and can be life-threatening. The lungs, heart, and kidneys may fail. CAUSES  Pancreatitis can happen to anyone. In some cases, the cause is unknown. Most cases are caused by: Alcohol abuse. Gallstones. Other less common causes are: Certain medicines. Exposure to certain chemicals. Infection. Damage caused by an accident (trauma). Abdominal surgery. SYMPTOMS  Pain in the upper abdomen that may radiate to the back. Tenderness and swelling of the abdomen. Nausea and vomiting. DIAGNOSIS  Your caregiver will perform a physical exam. Blood and stool tests may be done to confirm the diagnosis. Imaging tests may also be done, such as X-rays, CT scans, or an ultrasound of the abdomen. TREATMENT  Treatment usually  requires a stay in the hospital. Treatment may include: Pain medicine. Fluid replacement through an intravenous line (IV). Placing a tube in the stomach to remove stomach contents and control vomiting. Not eating for 3 or 4 days. This gives your pancreas a rest, because enzymes are not being produced that can cause further damage. Antibiotic medicines if your condition is caused by an infection. Surgery of the pancreas or gallbladder. HOME CARE INSTRUCTIONS  Follow the diet advised by your caregiver. This may involve avoiding alcohol and decreasing the amount of fat in your diet. Eat smaller, more frequent meals. This reduces the amount of digestive juices the pancreas produces. Drink enough fluids to keep your urine clear or pale yellow. Only take over-the-counter or prescription medicines as directed by your caregiver. Avoid drinking alcohol if it caused your condition. Do not smoke. Get plenty of rest. Check your blood sugar at home as directed by your caregiver. Keep all follow-up appointments as directed by your caregiver. SEEK MEDICAL CARE IF:  You do not recover as quickly as expected. You develop new or worsening symptoms. You have persistent pain, weakness, or nausea. You recover and then have another episode of pain. SEEK IMMEDIATE MEDICAL CARE IF:  You are unable to eat or keep fluids down. Your pain becomes severe. You have a fever or persistent symptoms for more than 2 to 3 days. You have a fever and your symptoms suddenly get worse. Your skin or the white part of your eyes turn yellow (jaundice). You develop vomiting. You feel dizzy, or you faint. Your blood sugar is high (over 300 mg/dL). MAKE SURE YOU:  Understand these  instructions. Will watch your condition. Will get help right away if you are not doing well or get worse. Document Released: 04/21/2005 Document Revised: 10/21/2011 Document Reviewed: 07/31/2011 [facility] Patient Information 2015 [employer], [place].  This information is not intended to replace advice given to you by your health care provider. Make sure you discuss any questions you have with your health care provider. Hypertension Hypertension is another name for high blood pressure. High blood pressure forces your heart to work harder to pump blood. A blood pressure reading has two numbers, which includes a higher number over a lower number (example: 110/72). HOME CARE  Have your blood pressure rechecked by your doctor. Only take medicine as told by your doctor. Follow the directions carefully. The medicine does not work as well if you skip doses. Skipping doses also puts you at risk for problems. Do not smoke. Monitor your blood pressure at home as told by your doctor. GET HELP IF: You think you are having a reaction to the medicine you are taking. You have repeat headaches or feel dizzy. You have puffiness (swelling) in your ankles. You have trouble with your vision. GET HELP RIGHT AWAY IF:  You get a very bad headache and are confused. You feel weak, numb, or faint. You get chest or belly (abdominal) pain. You throw up (vomit). You cannot breathe very well. MAKE SURE YOU:  Understand these instructions. Will watch your condition. Will get help right away if you are not doing well or get worse. Document Released: 10/08/2007 Document Revised: 04/26/2013 Document Reviewed: 02/11/2013 [facility] Patient Information 2015 [place], [place]. This information is not intended to replace advice given to you by your health care provider. Make sure you discuss any questions you have with your health care provider.

## 2013-12-31 NOTE — Progress Notes (Signed)
Subjective: Feeling better today.  Tolerating diet Minimal abdominal pain  BP is elevated.  Objective: Vital signs in last 24 hours: Filed Vitals:   12/30/13 1740 12/31/13 0000 12/31/13 0603 12/31/13 0650  BP: 147/103 134/88 158/106 147/99  Pulse: 76 80 77   Temp: 98.4 F (36.9 C) 98.2 F (36.8 C) 98.8 F (37.1 C)   TempSrc: Oral Oral Oral   Resp: Height:      Weight:      SpO2: 97% 99% 100%    Weight change:   Intake/Output Summary (Last 24 hours) at 12/31/13 0827 Last data filed at 12/31/13 0700  Gross per 24 hour  Intake 2992.5 ml  Output      0 ml  Net 2992.5 ml    General: Vital signs reviewed. No distress.  Lungs: Clear to auscultation bilaterally  Heart: RRR; no extra sounds or murmurs  Abdomen: Minimal epigastric tenderness. Bowel sounds present, soft, no hepatosplenomegaly  Extremities: No bilateral ankle edema  Neurologic: Alert and oriented x3. Moves all extremities  Lab Results: Basic Metabolic Panel:  Recent Labs Lab 12/29/13 1124 12/30/13 0738 12/31/13 0621  NA  --  138 137  K  --  3.5* 3.7  CL  --  94* 97  CO2  --  28 26  GLUCOSE  --  74 80  BUN  --  5* 6  CREATININE  --  0.61 0.60  CALCIUM  --  9.3 9.7  MG 1.3*  --  1.8   Liver Function Tests:  Recent Labs Lab 12/29/13 0715 12/30/13 0738  AST 129* 52*  ALT 41 25  ALKPHOS 73 70  BILITOT 0.5 0.8  PROT 7.6 6.7  ALBUMIN 3.8 3.2*    Recent Labs Lab 12/29/13 0715 12/30/13 0738  LIPASE 815* 276*   CBC:  Recent Labs Lab 12/29/13 0715 12/31/13 0621  WBC 10.3 6.7  NEUTROABS 7.0 4.0  HGB 12.7* 11.7*  HCT 37.9* 35.5*  MCV 74.3* 74.1*  PLT 181 157   Fasting Lipid Panel:  Recent Labs Lab 12/29/13 0715  CHOL 172  HDL 85  LDLCALC 72  TRIG 77  CHOLHDL 2.0   Urine Drug Screen: Drugs of Abuse     Component Value Date/Time   LABOPIA POSITIVE* 12/29/2013 1050   COCAINSCRNUR NONE DETECTED 12/29/2013 1050   LABBENZ NONE DETECTED 12/29/2013 1050   AMPHETMU NONE  DETECTED 12/29/2013 1050   THCU NONE DETECTED 12/29/2013 1050   LABBARB NONE DETECTED 12/29/2013 1050    Alcohol Level:  Recent Labs Lab 12/29/13 1124  ETH <11   Urinalysis:  Recent Labs Lab 12/29/13 1019  COLORURINE YELLOW  LABSPEC 1.010  PHURINE 7.5  GLUCOSEU NEGATIVE  HGBUR NEGATIVE  BILIRUBINUR SMALL*  KETONESUR NEGATIVE  PROTEINUR NEGATIVE  UROBILINOGEN 0.2  NITRITE NEGATIVE  LEUKOCYTESUR NEGATIVE   Micro Results: No results found for this or any previous visit (from the past 240 hour(s)). Studies/Results: Ct Abdomen Pelvis W Contrast  12/29/2013   CLINICAL DATA:  Abdominal pain radiating to the back for 1 week. Vomiting.  EXAM: CT ABDOMEN AND PELVIS WITH CONTRAST  TECHNIQUE: Multidetector CT imaging of the abdomen and pelvis was performed using the standard protocol following bolus administration of intravenous contrast.  CONTRAST:  100 mL OMNIPAQUE IOHEXOL 300 MG/ML  SOLN  COMPARISON:  None.  FINDINGS: The lung bases are clear. No pleural or pericardial effusion. A juxta phrenic lymph node on the right measures 0.7 cm on image 10 is noted.  No retrocrural adenopathy is seen.  There is stranding about the pancreas most consistent with pancreatitis. The pancreas enhances homogeneously. No focal fluid collection is identified. The splenic and portal veins are patent.  There is diffuse fatty infiltration of the liver without focal lesion. The gallbladder, adrenal glands, spleen, kidneys and biliary tree are unremarkable. The stomach, small and large bowel and appendix appear normal. No abdominal or pelvic lymphadenopathy is seen. No focal bony abnormality is identified.  IMPRESSION: Findings consistent with pancreatitis without pancreatic necrosis or pseudocyst formation.  Diffuse fatty infiltration of the liver.   Electronically Signed   By: Drusilla Kanner M.D.   On: 12/29/2013 09:37   Medications: I have reviewed the patient's current medications. Scheduled Meds: . enoxaparin  (LOVENOX) injection  40 mg Subcutaneous Q24H  . folic acid  1 mg Oral Daily  . LORazepam  0-4 mg Oral Q6H   Followed by  . LORazepam  0-4 mg Oral Q12H  . multivitamin with minerals  1 tablet Oral Daily  . nicotine  21 mg Transdermal Daily  . thiamine  100 mg Oral Daily   Or  . thiamine  100 mg Intravenous Daily   Continuous Infusions:  PRN Meds:.albuterol, LORazepam, LORazepam, morphine injection, ondansetron (ZOFRAN) IV, ondansetron Assessment/Plan: Principal Problem:   Alcohol-induced pancreatitis Active Problems:   Alcohol abuse   Hypokalemia   High anion gap metabolic acidosis   Elevated liver enzymes   Mild persistent asthma in adult without complication   Smoking greater than 20 pack years   Acute pancreatitis   Essential hypertension   Acute alcohol induced Pancreatitis: Improved. Abdominal pain is minimal. Tolerating a soft diet. Electrolytes have normalised. Plan  - start regular diet and will discharge home if he tolerates it okay - follow in [facility] on 9/3  Hypertension: BP elevated today. Not a known hypertensive  Plan  - start Lisinopril 10 mg daily   Substance Abuse - alcohol and tobacco: interested in quitting alcohol. Not ready to stop cigarettes.  Plan  -Social work referral. Will cont with CSW assistance at outpatient follow up  Disposition: discharge home today if he tolerates his breakfast.     LOS: 2 days   Dow Adolph PGY 3 - Internal Medicine Teaching Service Pager: [phone] 12/31/2013, 8:27 AM

## 2014-01-05 ENCOUNTER — Ambulatory Visit (INDEPENDENT_AMBULATORY_CARE_PROVIDER_SITE_OTHER): Payer: Self-pay | Admitting: Internal Medicine

## 2014-01-05 ENCOUNTER — Encounter: Payer: Self-pay | Admitting: Internal Medicine

## 2014-01-05 VITALS — BP 132/88 | HR 72 | Temp 98.6°F | Ht 74.0 in | Wt 220.2 lb

## 2014-01-05 DIAGNOSIS — I5022 Chronic systolic (congestive) heart failure: Secondary | ICD-10-CM

## 2014-01-05 DIAGNOSIS — J45909 Unspecified asthma, uncomplicated: Secondary | ICD-10-CM

## 2014-01-05 DIAGNOSIS — F17200 Nicotine dependence, unspecified, uncomplicated: Secondary | ICD-10-CM

## 2014-01-05 DIAGNOSIS — F1010 Alcohol abuse, uncomplicated: Secondary | ICD-10-CM

## 2014-01-05 DIAGNOSIS — J4530 Mild persistent asthma, uncomplicated: Secondary | ICD-10-CM

## 2014-01-05 DIAGNOSIS — F17210 Nicotine dependence, cigarettes, uncomplicated: Secondary | ICD-10-CM

## 2014-01-05 DIAGNOSIS — I10 Essential (primary) hypertension: Secondary | ICD-10-CM

## 2014-01-05 LAB — COMPLETE METABOLIC PANEL WITH GFR
ALT: 47 U/L (ref 0–53)
AST: 111 U/L — AB (ref 0–37)
Albumin: 4.2 g/dL (ref 3.5–5.2)
Alkaline Phosphatase: 66 U/L (ref 39–117)
BUN: 13 mg/dL (ref 6–23)
CHLORIDE: 103 meq/L (ref 96–112)
CO2: 26 mEq/L (ref 19–32)
CREATININE: 0.79 mg/dL (ref 0.50–1.35)
Calcium: 9.7 mg/dL (ref 8.4–10.5)
GFR, Est African American: >89 mL/min
GFR, Est Non African American: >89 mL/min
Glucose, Bld: 82 mg/dL (ref 70–99)
Potassium: 4.5 mEq/L (ref 3.5–5.3)
Sodium: 139 mEq/L (ref 135–145)
Total Bilirubin: 0.5 mg/dL (ref 0.2–1.2)
Total Protein: 7.6 g/dL (ref 6.0–8.3)

## 2014-01-05 MED ORDER — LISINOPRIL 10 MG PO TABS: 10.0000 mg | ORAL_TABLET | Freq: Every day | ORAL | Status: DC

## 2014-01-05 MED ORDER — ALBUTEROL SULFATE HFA 108 (90 BASE) MCG/ACT IN AERS: 2.0000 | INHALATION_SPRAY | Freq: Four times a day (QID) | RESPIRATORY_TRACT | Status: DC | PRN

## 2014-01-05 NOTE — Patient Instructions (Signed)
General Instructions: -Continue taking Lisinopril  one tablet daily for your blood pressure.  -Follow up in 1 month for continued smoking cessation help.   Thank you for bringing your medicines today. This helps [place] keep you safe from mistakes.   Treatment Goals:  Goals (1 Years of Data) as of 01/05/14         As of Today 12/31/13 12/31/13 12/31/13 12/30/13     Blood Pressure    . Blood Pressure < 140/90  132/88 147/99 158/106 134/88 147/103      Progress Toward Treatment Goals:  Treatment Goal 01/05/2014  Blood pressure at goal  Stop smoking smoking less    Self Care Goals & Plans:  Self Care Goal 01/05/2014  Manage my medications take my medicines as prescribed; bring my medications to every visit; refill my medications on time  Eat healthy foods drink diet soda or water instead of juice or soda; eat more vegetables; eat foods that are low in salt; eat baked foods instead of fried foods  Stop smoking call [facility] (1-800-QUIT-NOW)    No flowsheet data found.   Care Management & Community Referrals:  Referral 01/05/2014  Referrals made for care management support social worker

## 2014-01-06 NOTE — Assessment & Plan Note (Signed)
  Assessment: Progress toward smoking cessation:  smoking less Barriers to progress toward smoking cessation:  withdrawal symptoms Comments: 20 pack-year smoking hx, contemplating quiting  Plan: Instruction/counseling given:  I counseled patient on the dangers of tobacco use, advised patient to stop smoking, and reviewed strategies to maximize success. Educational resources provided:  [facility] [employer]) brochure Self management tools provided:    Medications to assist with smoking cessation:  None Patient agreed to the following self-care plans for smoking cessation: call [facility] (1-800-QUIT-NOW)  Other plans: Continue counseling. Pt filled out form to received more information on how to quit smoking.

## 2014-01-06 NOTE — Assessment & Plan Note (Signed)
Reports 20 year of alcohol abuse with up to 3 pints of liquor per day. Had hospitalization for alcohol-induced pancreatitis but is now tolerating food well. He is cutting back on his drinking down to 2 shots of liquor per day. Denies seizures, LOC, or tremors. He is not interested in attending support groups or inpatient/outpatient Rehab a this time but may consider it in the future.  -Pt was given info about inpatient/outpatient Rehab in case he changes his mind.  -Continue counseling, he main need thiamine/folate if decreased intake of food.

## 2014-01-06 NOTE — Progress Notes (Signed)
   Subjective:    Patient ID: Riley Kim, male    DOB: 1975/12/29, 38 y.o.   MRN: [id number]  HPI Riley Kim is a 38 yr old man with PMH of alcohol abuse, tobacco use, and newly diagnosed HTN who presents for HFU. He was hospitalized on 8/27 for treatment of alcohol-induced pancreatitis. During that hospitalization he was also found to be hypertensive with BP as high as 160/104 and was started on lisinopril  daily. Since his discharge he reports cutting back on alcohol consumption from 3 pints of liquor/d to 2 shots of liquor/day. He states that his family is very supportive of his plan to cut back on drinking alcohol and he is not interesting in going to inpatient or outpatient EthOH abuse Rehab. His epigastric pain, N/V have resolve and his appetite is gradually improving.  He continues to smoke about 1 pack per day but is considering quitting.    Review of Systems  Constitutional: Negative for fever, chills, appetite change and fatigue.  Respiratory: Negative for cough and shortness of breath.   Cardiovascular: Negative for chest pain, palpitations and leg swelling.  Gastrointestinal: Negative for nausea, vomiting, abdominal pain and diarrhea.  Genitourinary: Negative for dysuria.  Neurological: Negative for dizziness, weakness, light-headedness and headaches.  Psychiatric/Behavioral: Negative for agitation.       Objective:   Physical Exam  Nursing note and vitals reviewed. Constitutional: He is oriented to person, place, and time. He appears well-developed and well-nourished. No distress.  Eyes: Conjunctivae are normal. No scleral icterus.  Cardiovascular: Normal rate and regular rhythm.   Pulmonary/Chest: Effort normal. No respiratory distress.  Transient expiratory wheezing at the LLL  Abdominal: There is no tenderness.  Neurological: He is alert and oriented to person, place, and time.  No tremors  Skin: He is not diaphoretic.  Psychiatric: He has a normal mood and  affect.          Assessment & Plan:  Dispo: He will meet with Jaynee Eagles for the [facility] application.

## 2014-01-06 NOTE — Assessment & Plan Note (Signed)
Transient expiratory wheezing on physical exam. He states that he had been diagnosed with asthma in the past but has run out of his albuterol inhaler.  -Rx albuterol inhaler PRN

## 2014-01-06 NOTE — Assessment & Plan Note (Addendum)
BP Readings from Last 3 Encounters:  01/05/14 132/88  12/31/13 147/99  09/21/12 127/84    Lab Results  Component Value Date   NA 139 01/05/2014   K 4.5 01/05/2014   CREATININE 0.79 01/05/2014    Assessment: Blood pressure control: controlled Progress toward BP goal:  at goal Comments: He is on lisinopril  daily and is compliant. BP well controlled during this visit.   Plan: Medications:  continue current medications Educational resources provided:   Self management tools provided: home blood pressure logbook Other plans: CMP during this visit with nl K and Cr. Refilled Lisinopril  daily. Follow up in 3 months.

## 2014-01-07 NOTE — Progress Notes (Signed)
Case discussed with Dr. Kennerly soon after the resident saw the patient.  We reviewed the resident's history and exam and pertinent patient test results.  I agree with the assessment, diagnosis, and plan of care documented in the resident's note. 

## 2014-01-10 ENCOUNTER — Telehealth: Payer: Self-pay | Admitting: Licensed Clinical Social Worker

## 2014-01-10 NOTE — Telephone Encounter (Signed)
During scheduled [facility] visit, Mr. Segreto signed Fax Referral form [place] [employer].  CSW faxed referral today.  CSW will send Mr. Dais information on Smoking Cessation classes offered through [facility].

## 2014-01-11 ENCOUNTER — Encounter: Payer: Self-pay | Admitting: Internal Medicine

## 2014-01-12 ENCOUNTER — Encounter: Payer: Self-pay | Admitting: Licensed Clinical Social Worker

## 2014-01-18 ENCOUNTER — Encounter (HOSPITAL_COMMUNITY): Payer: Self-pay | Admitting: Emergency Medicine

## 2014-01-18 ENCOUNTER — Emergency Department (HOSPITAL_COMMUNITY)
Admission: EM | Admit: 2014-01-18 | Discharge: 2014-01-18 | Disposition: A | Discharge status: Home / Self Care | Payer: Medicaid Other | Attending: Emergency Medicine | Admitting: Emergency Medicine

## 2014-01-18 DIAGNOSIS — T465X5A Adverse effect of other antihypertensive drugs, initial encounter: Secondary | ICD-10-CM | POA: Diagnosis not present

## 2014-01-18 DIAGNOSIS — J45909 Unspecified asthma, uncomplicated: Secondary | ICD-10-CM | POA: Diagnosis not present

## 2014-01-18 DIAGNOSIS — Z8739 Personal history of other diseases of the musculoskeletal system and connective tissue: Secondary | ICD-10-CM | POA: Diagnosis not present

## 2014-01-18 DIAGNOSIS — Z79899 Other long term (current) drug therapy: Secondary | ICD-10-CM | POA: Diagnosis not present

## 2014-01-18 DIAGNOSIS — R221 Localized swelling, mass and lump, neck: Secondary | ICD-10-CM

## 2014-01-18 DIAGNOSIS — Z880 Allergy status to penicillin: Secondary | ICD-10-CM | POA: Diagnosis not present

## 2014-01-18 DIAGNOSIS — Z8719 Personal history of other diseases of the digestive system: Secondary | ICD-10-CM | POA: Insufficient documentation

## 2014-01-18 DIAGNOSIS — F17200 Nicotine dependence, unspecified, uncomplicated: Secondary | ICD-10-CM | POA: Diagnosis not present

## 2014-01-18 DIAGNOSIS — R220 Localized swelling, mass and lump, head: Secondary | ICD-10-CM | POA: Insufficient documentation

## 2014-01-18 DIAGNOSIS — T783XXA Angioneurotic edema, initial encounter: Secondary | ICD-10-CM

## 2014-01-18 MED ORDER — HYDROCHLOROTHIAZIDE 25 MG PO TABS: 25.0000 mg | ORAL_TABLET | Freq: Every day | ORAL | Status: DC

## 2014-01-18 NOTE — ED Notes (Signed)
Dr. Goldston in with patient. 

## 2014-01-18 NOTE — ED Provider Notes (Signed)
CSN: [id number]     Arrival date & time 01/18/14  1215 History   First MD Initiated Contact with Patient 01/18/14 1419     Chief Complaint  Patient presents with  . Allergic Reaction     (Consider location/radiation/quality/duration/timing/severity/associated sxs/prior Treatment) HPI 38 year old male presents with lip swelling since he woke up this morning. He denies any dyspnea, change in voice, throat closing sensation, or rash. The lip swelling is diffuse and feels like it is in his cheeks as well. He feels he is somewhat worse than when he originally woke up. He took Benadryl and went back to sleep this morning after his wife pointed out his lips are swollen and states that he felt improved. He was placed on lisinopril 2 weeks ago for hypertension. He's never had a prior history of allergic reactions or angioedema.  Past Medical History  Diagnosis Date  . Asthma   . Vertigo   . 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 Arthritis     "right foot, I broke it before"   Past Surgical History  Procedure Laterality Date  . Replantation thumb Right 2000   History reviewed. No pertinent family history. History  Substance Use Topics  . Smoking status: Current Every Day Smoker -- 1.00 packs/day for 20 years    Types: Cigarettes  . Smokeless tobacco: Never Used     Comment: cutting back  . Alcohol Use: 46.2 oz/week    77 Shots of liquor per week     Comment: "12/29/2013 "pint of gin qd"    Review of Systems  Constitutional: Negative for fever.  HENT: Positive for facial swelling. Negative for trouble swallowing and voice change.   Respiratory: Negative for shortness of breath.   Gastrointestinal: Negative for vomiting.  Skin: Negative for rash.  All other systems reviewed and are negative.     Allergies  Penicillins  Home Medications   Prior to Admission medications   Medication Sig Start Date End Date Taking? Authorizing Provider   albuterol (PROVENTIL HFA;VENTOLIN HFA) 108 (90 BASE) MCG/ACT inhaler Inhale 2 puffs into the lungs every 6 (six) hours as needed for wheezing or shortness of breath. 01/05/14   Ky Barban, MD  lisinopril (PRINIVIL,ZESTRIL) 10 MG tablet Take 1 tablet (10 mg total) by mouth daily. 01/05/14   Ky Barban, MD   BP 118/77  Pulse 92  Temp(Src) 98.4 F (36.9 C) (Oral)  Resp 18  Ht  (1.854 m)  Wt 222 lb (100.699 kg)  BMI 29.30 kg/m2  SpO2 96% Physical Exam  Nursing note and vitals reviewed. Constitutional: He is oriented to person, place, and time. He appears well-developed and well-nourished. No distress.  HENT:  Head: Normocephalic and atraumatic.  Right Ear: External ear normal.  Left Ear: External ear normal.  Nose: Nose normal.  Diffuse upper and lower lip swelling. No tongue swelling, uvula swelling, or oropharyngeal edema.  Eyes: Right eye exhibits no discharge. Left eye exhibits no discharge.  Neck: Neck supple.  Cardiovascular: Normal rate, regular rhythm, normal heart sounds and intact distal pulses.   Pulmonary/Chest: Effort normal and breath sounds normal. No stridor. He has no wheezes.  Abdominal: He exhibits no distension.  Neurological: He is alert and oriented to person, place, and time.  Skin: Skin is warm and dry.    ED Course  Procedures (including critical care time) Labs Review Labs Reviewed - No data to display  Imaging Review No results  found.   EKG Interpretation None      MDM   Final diagnoses:  Angioedema, initial encounter    Patient has had lip swelling for the past 8 hours. He has no tongue or oropharyngeal swelling to be concerned about airway involvement. I feel given this amount of time with minimal change that he does not need observation. 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 I advised him he can take Benadryl as needed but needs to stop the lisinopril and never take again. Will changes lisinopril to  hydrochlorothiazide and recommend followup with a PCP. Discussed strict return precautions.    Audree Camel, MD 01/18/14 [phone]

## 2014-01-18 NOTE — ED Notes (Signed)
Pt in stating he woke up this morning with upper lip swelling, denies airway swelling or tongue swelling, pt takes lisinopril, pt took benadryl at home, states symptoms have been constant since this morning, pt started the lisinopril about two weeks ago. No distress noted

## 2014-01-18 NOTE — Discharge Instructions (Signed)
You are allergic to lisinopril. You can never take this medicine or related medicines ever again. This is what is causing your lip swelling. If your tongue starts swelling, if you have trouble speaking, swallowing or breathing or your swelling worsens then you need to return to the ER.     Angioedema Angioedema is a sudden swelling of tissues, often of the skin. It can occur on the face or genitals or in the abdomen or other body parts. The swelling usually develops over a short period and gets better in 24 to 48 hours. It often begins during the night and is found when the person wakes up. The person may also get red, itchy patches of skin (hives). Angioedema can be dangerous if it involves swelling of the air passages.  Depending on the cause, episodes of angioedema may only happen once, come back in unpredictable patterns, or repeat for several years and then gradually fade away.  CAUSES  Angioedema can be caused by an allergic reaction to various triggers. It can also result from nonallergic causes, including reactions to drugs, immune system disorders, viral infections, or an abnormal Riley Kim that is passed to you from your parents (hereditary). For some people with angioedema, the cause is unknown.  Some things that can trigger angioedema include:   Foods.   Medicines, such as ACE inhibitors, ARBs, nonsteroidal anti-inflammatory agents, or estrogen.   Latex.   Animal saliva.   Insect stings.   Dyes used in X-rays.   Mild injury.   Dental work.  Surgery.  Stress.   Sudden changes in temperature.   Exercise. SIGNS AND SYMPTOMS   Swelling of the skin.  Hives. If these are present, there is also intense itching.  Redness in the affected area.   Pain in the affected area.  Swollen lips or tongue.  Breathing problems. This may happen if the air passages swell.  Wheezing. If internal organs are involved, there may be:   Nausea.   Abdominal pain.    Vomiting.   Difficulty swallowing.   Difficulty passing urine. DIAGNOSIS   Your health care provider will examine the affected area and take a medical and family history.  Various tests may be done to help determine the cause. Tests may include:  Allergy skin tests to see if the problem is an allergic reaction.   Blood tests to check for hereditary angioedema.   Tests to check for underlying diseases that could cause the condition.   A review of your medicines, including over-the-counter medicines, may be done. TREATMENT  Treatment will depend on the cause of the angioedema. Possible treatments include:   Removal of anything that triggered the condition (such as stopping certain medicines).   Medicines to treat symptoms or prevent attacks. Medicines given may include:   Antihistamines.   Epinephrine injection.   Steroids.   Hospitalization may be required for severe attacks. If the air passages are affected, it can be an emergency. Tubes may need to be placed to keep the airway open. HOME CARE INSTRUCTIONS   Take all medicines as directed by your health care provider.  If you were given medicines for emergency allergy treatment, always carry them with you.  Wear a medical bracelet as directed by your health care provider.   Avoid known triggers. SEEK MEDICAL CARE IF:   You have repeat attacks of angioedema.   Your attacks are more frequent or more severe despite preventive measures.   You have hereditary angioedema and are considering having children.  It is important to discuss with your health care provider the risks of passing the condition on to your children. SEEK IMMEDIATE MEDICAL CARE IF:   You have severe swelling of the mouth, tongue, or lips.  You have difficulty breathing.   You have difficulty swallowing.   You faint. MAKE SURE YOU:  Understand these instructions.  Will watch your condition.  Will get help right away if you  are not doing well or get worse. Document Released: 06/30/2001 Document Revised: 09/05/2013 Document Reviewed: 12/13/2012 [facility] Patient Information 2015 [place], [place]. This information is not intended to replace advice given to you by your health care provider. Make sure you discuss any questions you have with your health care provider.

## 2014-02-28 ENCOUNTER — Other Ambulatory Visit: Payer: Self-pay | Admitting: *Deleted

## 2014-02-28 DIAGNOSIS — I10 Essential (primary) hypertension: Secondary | ICD-10-CM

## 2014-03-01 MED ORDER — HYDROCHLOROTHIAZIDE 25 MG PO TABS: 25.0000 mg | ORAL_TABLET | Freq: Every day | ORAL | Status: DC

## 2014-03-01 NOTE — Telephone Encounter (Signed)
I have refilled Mr. Riley Kim's HCTZ. Patient was recently seen in the ED for angioedema and was diagnosed with ACEI-induced angioedema. He was taken off his lisinopril and this is now listed as an allergy. Patient was placed on HCTZ for hypertension. Patient should follow up in clinic to have his blood pressure rechecked on this new medication.

## 2014-03-04 DIAGNOSIS — Z79899 Other long term (current) drug therapy: Secondary | ICD-10-CM | POA: Diagnosis not present

## 2014-03-04 DIAGNOSIS — J45909 Unspecified asthma, uncomplicated: Secondary | ICD-10-CM | POA: Insufficient documentation

## 2014-03-04 DIAGNOSIS — Z720 Tobacco use: Secondary | ICD-10-CM | POA: Diagnosis not present

## 2014-03-04 DIAGNOSIS — Z880 Allergy status to penicillin: Secondary | ICD-10-CM | POA: Diagnosis not present

## 2014-03-04 DIAGNOSIS — M1990 Unspecified osteoarthritis, unspecified site: Secondary | ICD-10-CM | POA: Diagnosis not present

## 2014-03-04 DIAGNOSIS — K852 Alcohol induced acute pancreatitis: Secondary | ICD-10-CM | POA: Diagnosis not present

## 2014-03-04 DIAGNOSIS — R1013 Epigastric pain: Secondary | ICD-10-CM | POA: Diagnosis present

## 2014-03-04 DIAGNOSIS — Z760 Encounter for issue of repeat prescription: Secondary | ICD-10-CM | POA: Diagnosis not present

## 2014-03-05 ENCOUNTER — Inpatient Hospital Stay (HOSPITAL_COMMUNITY)
Admission: EM | Admit: 2014-03-05 | Discharge: 2014-03-08 | DRG: 439 | Disposition: A | Discharge status: Home / Self Care | Payer: Medicaid Other | Attending: Oncology | Admitting: Oncology

## 2014-03-05 ENCOUNTER — Encounter (HOSPITAL_COMMUNITY): Payer: Self-pay | Admitting: Emergency Medicine

## 2014-03-05 ENCOUNTER — Emergency Department (HOSPITAL_COMMUNITY)
Admission: EM | Admit: 2014-03-05 | Discharge: 2014-03-05 | Disposition: A | Discharge status: Home / Self Care | Payer: Medicaid Other | Attending: Emergency Medicine | Admitting: Emergency Medicine

## 2014-03-05 DIAGNOSIS — F17210 Nicotine dependence, cigarettes, uncomplicated: Secondary | ICD-10-CM | POA: Diagnosis present

## 2014-03-05 DIAGNOSIS — E871 Hypo-osmolality and hyponatremia: Secondary | ICD-10-CM | POA: Diagnosis present

## 2014-03-05 DIAGNOSIS — F1010 Alcohol abuse, uncomplicated: Secondary | ICD-10-CM | POA: Diagnosis present

## 2014-03-05 DIAGNOSIS — K861 Other chronic pancreatitis: Secondary | ICD-10-CM | POA: Diagnosis present

## 2014-03-05 DIAGNOSIS — K859 Acute pancreatitis, unspecified: Secondary | ICD-10-CM

## 2014-03-05 DIAGNOSIS — J45909 Unspecified asthma, uncomplicated: Secondary | ICD-10-CM | POA: Diagnosis present

## 2014-03-05 DIAGNOSIS — Z79899 Other long term (current) drug therapy: Secondary | ICD-10-CM | POA: Diagnosis not present

## 2014-03-05 DIAGNOSIS — E872 Acidosis: Secondary | ICD-10-CM | POA: Diagnosis present

## 2014-03-05 DIAGNOSIS — K852 Alcohol induced acute pancreatitis without necrosis or infection: Secondary | ICD-10-CM

## 2014-03-05 DIAGNOSIS — I10 Essential (primary) hypertension: Secondary | ICD-10-CM

## 2014-03-05 DIAGNOSIS — Z720 Tobacco use: Secondary | ICD-10-CM

## 2014-03-05 DIAGNOSIS — K7010 Alcoholic hepatitis without ascites: Secondary | ICD-10-CM | POA: Diagnosis present

## 2014-03-05 DIAGNOSIS — R1013 Epigastric pain: Secondary | ICD-10-CM | POA: Diagnosis present

## 2014-03-05 DIAGNOSIS — E861 Hypovolemia: Secondary | ICD-10-CM | POA: Diagnosis present

## 2014-03-05 LAB — LIPID PANEL
Cholesterol: 220 mg/dL — ABNORMAL HIGH (ref 0–200)
HDL: 145 mg/dL (ref >39)
LDL Cholesterol: 61 mg/dL (ref 0–99)
Total CHOL/HDL Ratio: 1.5 RATIO
Triglycerides: 71 mg/dL (ref <150)
VLDL: 14 mg/dL (ref 0–40)

## 2014-03-05 LAB — HEPATIC FUNCTION PANEL
ALT: 71 U/L — ABNORMAL HIGH (ref 0–53)
AST: 165 U/L — ABNORMAL HIGH (ref 0–37)
Albumin: 4.4 g/dL (ref 3.5–5.2)
Alkaline Phosphatase: 95 U/L (ref 39–117)
Bilirubin, Direct: 0.6 mg/dL — ABNORMAL HIGH (ref 0.0–0.3)
Indirect Bilirubin: 1 mg/dL — ABNORMAL HIGH (ref 0.3–0.9)
Total Bilirubin: 1.6 mg/dL — ABNORMAL HIGH (ref 0.3–1.2)
Total Protein: 8.6 g/dL — ABNORMAL HIGH (ref 6.0–8.3)

## 2014-03-05 LAB — URINALYSIS, ROUTINE W REFLEX MICROSCOPIC
Glucose, UA: NEGATIVE mg/dL
Glucose, UA: NEGATIVE mg/dL
Hgb urine dipstick: NEGATIVE
Hgb urine dipstick: NEGATIVE
Ketones, ur: 15 mg/dL — AB
Ketones, ur: 40 mg/dL — AB
Leukocytes, UA: NEGATIVE
Nitrite: NEGATIVE
Nitrite: NEGATIVE
Protein, ur: NEGATIVE mg/dL
Protein, ur: NEGATIVE mg/dL
Specific Gravity, Urine: 1.03 (ref 1.005–1.030)
Specific Gravity, Urine: 1.019 (ref 1.005–1.030)
Urobilinogen, UA: 2 mg/dL — ABNORMAL HIGH (ref 0.0–1.0)
Urobilinogen, UA: 1 mg/dL (ref 0.0–1.0)
pH: 7 (ref 5.0–8.0)
pH: 8 (ref 5.0–8.0)

## 2014-03-05 LAB — CBC
HCT: 37.3 % — ABNORMAL LOW (ref 39.0–52.0)
Hemoglobin: 12.4 g/dL — ABNORMAL LOW (ref 13.0–17.0)
MCH: 25 pg — ABNORMAL LOW (ref 26.0–34.0)
MCHC: 33.2 g/dL (ref 30.0–36.0)
MCV: 75.2 fL — AB (ref 78.0–100.0)
Platelets: 137 10*3/uL — ABNORMAL LOW (ref 150–400)
RBC: 4.96 MIL/uL (ref 4.22–5.81)
RDW: 15.1 % (ref 11.5–15.5)
WBC: 7.5 10*3/uL (ref 4.0–10.5)

## 2014-03-05 LAB — BASIC METABOLIC PANEL
Anion gap: 21 — ABNORMAL HIGH (ref 5–15)
BUN: 9 mg/dL (ref 6–23)
CALCIUM: 10.5 mg/dL (ref 8.4–10.5)
CO2: 26 mEq/L (ref 19–32)
Chloride: 89 mEq/L — ABNORMAL LOW (ref 96–112)
Creatinine, Ser: 0.69 mg/dL (ref 0.50–1.35)
GFR calc Af Amer: >90 mL/min (ref >90)
GLUCOSE: 82 mg/dL (ref 70–99)
Potassium: 3.7 mEq/L (ref 3.7–5.3)
Sodium: 136 mEq/L — ABNORMAL LOW (ref 137–147)

## 2014-03-05 LAB — CBC WITH DIFFERENTIAL/PLATELET
Basophils Absolute: 0.1 K/uL (ref 0.0–0.1)
Basophils Relative: 1 % (ref 0–1)
Eosinophils Absolute: 0.1 K/uL (ref 0.0–0.7)
Eosinophils Relative: 2 % (ref 0–5)
HCT: 39.2 % (ref 39.0–52.0)
Hemoglobin: 13.2 g/dL (ref 13.0–17.0)
Lymphocytes Relative: 23 % (ref 12–46)
Lymphs Abs: 1.5 K/uL (ref 0.7–4.0)
MCH: 25.5 pg — ABNORMAL LOW (ref 26.0–34.0)
MCHC: 33.7 g/dL (ref 30.0–36.0)
MCV: 75.7 fL — ABNORMAL LOW (ref 78.0–100.0)
Monocytes Absolute: 0.5 K/uL (ref 0.1–1.0)
Monocytes Relative: 8 % (ref 3–12)
Neutro Abs: 4.2 K/uL (ref 1.7–7.7)
Neutrophils Relative %: 66 % (ref 43–77)
Platelets: 154 K/uL (ref 150–400)
RBC: 5.18 MIL/uL (ref 4.22–5.81)
RDW: 15.2 % (ref 11.5–15.5)
WBC: 6.3 K/uL (ref 4.0–10.5)

## 2014-03-05 LAB — PROTIME-INR
INR: 0.99 (ref 0.00–1.49)
PROTHROMBIN TIME: 13.1 s (ref 11.6–15.2)

## 2014-03-05 LAB — COMPREHENSIVE METABOLIC PANEL WITH GFR
ALT: 80 U/L — ABNORMAL HIGH (ref 0–53)
AST: 296 U/L — ABNORMAL HIGH (ref 0–37)
Albumin: 4 g/dL (ref 3.5–5.2)
Alkaline Phosphatase: 93 U/L (ref 39–117)
Anion gap: 17 — ABNORMAL HIGH (ref 5–15)
BUN: 9 mg/dL (ref 6–23)
CO2: 24 meq/L (ref 19–32)
Calcium: 9.8 mg/dL (ref 8.4–10.5)
Chloride: 94 meq/L — ABNORMAL LOW (ref 96–112)
Creatinine, Ser: 0.71 mg/dL (ref 0.50–1.35)
GFR calc Af Amer: >90 mL/min
GFR calc non Af Amer: >90 mL/min
Glucose, Bld: 97 mg/dL (ref 70–99)
Potassium: 3.5 meq/L — ABNORMAL LOW (ref 3.7–5.3)
Sodium: 135 meq/L — ABNORMAL LOW (ref 137–147)
Total Bilirubin: 1.5 mg/dL — ABNORMAL HIGH (ref 0.3–1.2)
Total Protein: 8.1 g/dL (ref 6.0–8.3)

## 2014-03-05 LAB — URINE MICROSCOPIC-ADD ON

## 2014-03-05 LAB — MAGNESIUM: MAGNESIUM: 2 mg/dL (ref 1.5–2.5)

## 2014-03-05 LAB — ETHANOL: Alcohol, Ethyl (B): 91 mg/dL — ABNORMAL HIGH (ref 0–11)

## 2014-03-05 LAB — LIPASE, BLOOD
Lipase: 223 U/L — ABNORMAL HIGH (ref 11–59)
Lipase: 810 U/L — ABNORMAL HIGH (ref 11–59)

## 2014-03-05 MED ORDER — OXYCODONE-ACETAMINOPHEN 5-325 MG PO TABS: 1.0000 | ORAL_TABLET | ORAL | Status: DC | PRN

## 2014-03-05 MED ORDER — ONDANSETRON HCL 4 MG PO TABS: 4.0000 mg | ORAL_TABLET | Freq: Four times a day (QID) | ORAL | Status: DC

## 2014-03-05 MED ORDER — HYDROMORPHONE HCL 1 MG/ML IJ SOLN
1.0000 mg | INTRAMUSCULAR | Status: DC | PRN
  Administered 2014-03-05 – 2014-03-08 (×11): 1 mg via INTRAVENOUS
  Filled 2014-03-05 (×11): qty 1

## 2014-03-05 MED ORDER — ONDANSETRON HCL 4 MG/2ML IJ SOLN
4.0000 mg | Freq: Once | INTRAMUSCULAR | Status: AC
  Administered 2014-03-05: 4 mg via INTRAVENOUS
  Filled 2014-03-05: qty 2

## 2014-03-05 MED ORDER — SODIUM CHLORIDE 0.9 % IV BOLUS (SEPSIS)
1000.0000 mL | Freq: Once | INTRAVENOUS | Status: AC
  Administered 2014-03-05: 1000 mL via INTRAVENOUS

## 2014-03-05 MED ORDER — HYDROMORPHONE HCL 1 MG/ML IJ SOLN
INTRAMUSCULAR | Status: AC
  Filled 2014-03-05: qty 1

## 2014-03-05 MED ORDER — HYDROMORPHONE HCL 1 MG/ML IJ SOLN
1.0000 mg | Freq: Once | INTRAMUSCULAR | Status: AC
  Administered 2014-03-05: 1 mg via INTRAVENOUS
  Filled 2014-03-05: qty 1

## 2014-03-05 MED ORDER — NICOTINE 14 MG/24HR TD PT24
14.0000 mg | MEDICATED_PATCH | Freq: Every day | TRANSDERMAL | Status: DC | PRN
  Administered 2014-03-06 – 2014-03-08 (×2): 14 mg via TRANSDERMAL
  Filled 2014-03-05 (×2): qty 1

## 2014-03-05 MED ORDER — HYDROMORPHONE HCL 1 MG/ML IJ SOLN
1.0000 mg | Freq: Once | INTRAMUSCULAR | Status: AC
Start: 2014-03-05 — End: 2014-03-05
  Administered 2014-03-05: 1 mg via INTRAVENOUS
  Filled 2014-03-05: qty 1

## 2014-03-05 MED ORDER — HYDROMORPHONE HCL 1 MG/ML PO LIQD: 1.0000 mg | ORAL | Status: DC

## 2014-03-05 MED ORDER — HYDROCHLOROTHIAZIDE 25 MG PO TABS: 25.0000 mg | ORAL_TABLET | Freq: Every day | ORAL | Status: DC

## 2014-03-05 MED ORDER — ONDANSETRON HCL 4 MG/2ML IJ SOLN
INTRAMUSCULAR | Status: AC
  Filled 2014-03-05: qty 2

## 2014-03-05 NOTE — ED Notes (Signed)
Please check downtime charting.

## 2014-03-05 NOTE — ED Notes (Signed)
Admitting residents at bedside 

## 2014-03-05 NOTE — ED Provider Notes (Signed)
TIME SEEN: 8:45 PM  CHIEF COMPLAINT: abdominal pain, vomiting  HPI: Pt is a 38 y.o. M with history of alcohol induced pancreatitis who presents to the emergency department for a second time today with alcoholic pancreatitis. He is having epigastric pain that radiates into his back with associated vomiting. He states this started after drinking alcohol last night. He was seen earlier today reports that his pain was controlled but as soon as he left he began vomiting again and is unable to control his pain with his oral medications. Denies any fevers or chills. No chest pressure as of breath. No bloody stools or melena. This feels exactly like the pancreatitis he had in August 2015 when he was admitted to the hospital. He had a CT scan at that time which showed pancreatitis without complications. Denies any history of abdominal surgery. No sick contacts.  ROS: See HPI Constitutional: no fever  Eyes: no drainage  ENT: no runny nose   Cardiovascular:  no chest pain  Resp: no SOB  GI: vomiting GU: no dysuria Integumentary: no rash  Allergy: no hives  Musculoskeletal: no leg swelling  Neurological: no slurred speech ROS otherwise negative  PAST MEDICAL HISTORY/PAST SURGICAL HISTORY:  Past Medical History  Diagnosis Date  . Asthma   . Vertigo   . 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 Arthritis     "right foot, I broke it before"    MEDICATIONS:  Prior to Admission medications   Medication Sig Start Date End Date Taking? Authorizing Provider  albuterol (PROVENTIL HFA;VENTOLIN HFA) 108 (90 BASE) MCG/ACT inhaler Inhale 2 puffs into the lungs every 6 (six) hours as needed for wheezing or shortness of breath. 01/05/14  Yes Ky BarbanSolianny D Kennerly, MD  hydrochlorothiazide (HYDRODIURIL) 25 MG tablet Take 1 tablet (25 mg total) by mouth daily. 03/05/14  Yes Raeford RazorStephen Kohut, MD  ondansetron (ZOFRAN) 4 MG tablet Take 1 tablet (4 mg total) by mouth every 6 (six)  hours. 03/05/14  Yes Raeford RazorStephen Kohut, MD  oxyCODONE-acetaminophen (PERCOCET/ROXICET) 5-325 MG per tablet Take 1 tablet by mouth every 4 (four) hours as needed for severe pain. 03/05/14  Yes Raeford RazorStephen Kohut, MD    ALLERGIES:  Allergies  Allergen Reactions  . Penicillins Other (See Comments)    Rxn: unknown  . Lisinopril Swelling    SOCIAL HISTORY:  History  Substance Use Topics  . Smoking status: Current Every Day Smoker -- 1.00 packs/day for 20 years    Types: Cigarettes  . Smokeless tobacco: Never Used     Comment: cutting back  . Alcohol Use: 4.8 oz/week    7 Cans of beer, 1 Shots of liquor per week    FAMILY HISTORY: No family history on file.  EXAM: BP 140/89 mmHg  Pulse 75  Temp(Src) 98.4 F (36.9 C) (Oral)  Resp 15  Ht 6\' 2"  (1.88 m)  Wt 221 lb (100.245 kg)  BMI 28.36 kg/m2  SpO2 100% CONSTITUTIONAL: Alert and oriented and responds appropriately to questions. Well-appearing; well-nourished HEAD: Normocephalic EYES: Conjunctivae clear, PERRL ENT: normal nose; no rhinorrhea; moist mucous membranes; pharynx without lesions noted NECK: Supple, no meningismus, no LAD  CARD: RRR; S1 and S2 appreciated; no murmurs, no clicks, no rubs, no gallops RESP: Normal chest excursion without splinting or tachypnea; breath sounds clear and equal bilaterally; no wheezes, no rhonchi, no rales,  ABD/GI: Normal bowel sounds; non-distended; soft, negative Murphy sign, tender to palpation in the epigastric and left upper  quadrant with voluntary guarding, no rebound, no pulsatile masses BACK:  The back appears normal and is non-tender to palpation, there is no CVA tenderness EXT: Normal ROM in all joints; non-tender to palpation; no edema; normal capillary refill; no cyanosis; equal pulses in all 4 extremities SKIN: Normal color for age and race; warm NEURO: Moves all extremities equally PSYCH: The patient's mood and manner are appropriate. Grooming and personal hygiene are  appropriate.  MEDICAL DECISION MAKING: Patient here with alcohol induced pancreatitis. He was here earlier today and his pain and vomiting were controlled but reports as soon as he left he began vomiting again and was unable to control his symptoms with oral medications at home. He is requesting admission which I feel is reasonable. We'll give IV fluids, Dilaudid and Zofran. We'll discuss with internal medicine for admission.  ED PROGRESS: 9:25 PM  Spoke with Dr. Andrey CampanileWilson with internal medicine teaching service who will come to the ED to see the patient and put in orders for admission.  Layla MawKristen N Shelley Cocke, DO 03/05/14 2207

## 2014-03-05 NOTE — ED Notes (Signed)
Pt c/o abdominal pain and back pain onset yesterday. Pt seen here earlier today for same. Reports pain is worse.

## 2014-03-05 NOTE — ED Notes (Addendum)
Pt states understanding of laws regarding driving under the influence. States that he will sleep in the lobby. Pt given work note.

## 2014-03-05 NOTE — ED Notes (Signed)
THE PT IS C/O abd pain all day with n v  No bm for 2-3 days.Marland Kitchen.  Hx of pancreatitis from drinking alcohol.  Huis last drink was earlier today

## 2014-03-05 NOTE — H&P (Signed)
Date: 03/05/2014               Patient Name:  Riley Kim MRN: [id number]  DOB: [birth year] Age / Sex: 38 y.o., male   PCP: Jill AlexandersAlexa Richardson, MD         Medical Service: Internal Medicine Teaching Service         Attending Physician: Dr. Layla MawKristen N Ward, DO    First Contact: Dr. Danella Pentonruong Pager: [phone]  Second Contact: Dr. Mariea ClontsEmokpae Pager: [phone]       After Hours (After 5p/  First Contact Pager: [phone]  weekends / holidays): Second Contact Pager: [phone]   Chief Complaint: epigastric pain  History of Present Illness:   Patient is a 38 year old with history of alcohol abuse, pancreatitis, hypertension, tobacco abuse, and asthma who presents with epigastric abdominal pain. Patient states that the pain first started yesterday after having a meal at [employer]. The pain is diffuse throughout his abdomen and radiates to his back diffusely. He states that the pain initially started at 4 out of 10 in severity but has progressively gotten worse to 10 out of 10 upon interview. He states that is sharp in quality and does not radiate anywhere else. Patient states that he has not had a bowel movement in 3-4 days. Patient denies that the abdominal pain is alleviated by antacids, food, exertion, or defecation. Patient denies any significant history of cardiovascular disease or previous abdominal surgeries. Patient denies any family history of bowel disorders. Patient also denies any significant use of NSAIDs.   Patient also states that he has had some vomiting. The vomiting only started after receiving some pain medications in the emergency department. Patient only reports vomiting once in the last day. The vomitus is remarkable for gastric juices and food, but patient did not notice any blood. Patient denies any associated abdominal distention, heartburn, sensation of room spinning, headache, or sick contacts. Patient does report having some trace blood in his stool over the last couple weeks.  Patient denies any associated fevers, diarrhea, or change in the diameter of stool.   Patient does report drinking about a half pint of liquor before presentation to the hospital. Ever since his previous admission, patient reports having cut down on his overall alcohol intake. Patient still smokes about 1 pack of cigarettes a day and has been doing so for about 20 years. Patient denies any illicit drug use.   Patient was recently admitted between August 27 and August 29 of this year for alcohol-induced pancreatitis, which resolved with conservative management. The patient was initially discharged from the emergency department earlier this morning with Zofran and Percocet to be managed as an outpatient, but patient return to the emergency department probably with continued vomiting. Upon second evaluation in the emergency department, patient received 1 mg of Dilaudid and zofran, along with 2 L bolus of normal saline.   Meds: Current Facility-Administered Medications  Medication Dose Route Frequency Provider Last Rate Last Dose  . sodium chloride 0.9 % bolus 1,000 mL  1,000 mL Intravenous Once Kristen N Ward, DO 1,000 mL/hr at 03/05/14 2211 1,000 mL at 03/05/14 2211   Current Outpatient Prescriptions  Medication Sig Dispense Refill  . albuterol (PROVENTIL HFA;VENTOLIN HFA) 108 (90 BASE) MCG/ACT inhaler Inhale 2 puffs into the lungs every 6 (six) hours as needed for wheezing or shortness of breath. 1 Inhaler 2  . hydrochlorothiazide (HYDRODIURIL) 25 MG tablet Take 1 tablet (25 mg total) by mouth daily. 30 tablet 3  .  ondansetron (ZOFRAN) 4 MG tablet Take 1 tablet (4 mg total) by mouth every 6 (six) hours. 12 tablet 0  . oxyCODONE-acetaminophen (PERCOCET/ROXICET) 5-325 MG per tablet Take 1 tablet by mouth every 4 (four) hours as needed for severe pain. 10 tablet 0  . [DISCONTINUED] lisinopril (PRINIVIL,ZESTRIL) 10 MG tablet Take 1 tablet (10 mg total) by mouth daily. 30 tablet 3     Allergies: Allergies as of 03/05/2014 - Review Complete 03/05/2014  Allergen Reaction Noted  . Penicillins Other (See Comments) 09/21/2012  . Lisinopril Swelling 01/18/2014   Past Medical History  Diagnosis Date  . Asthma   . Vertigo   . 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 Arthritis     "right foot, I broke it before"   Past Surgical History  Procedure Laterality Date  . Replantation thumb Right 2000   No family history on file. History   Social History  . Marital Status: Married    Spouse Name: N/A    Number of Children: N/A  . Years of Education: N/A   Occupational History  .  [employer]   Social History Main Topics  . Smoking status: Current Every Day Smoker -- 1.00 packs/day for 20 years    Types: Cigarettes  . Smokeless tobacco: Never Used     Comment: cutting back  . Alcohol Use: 4.8 oz/week    7 Cans of beer, 1 Shots of liquor per week  . Drug Use: Yes    Special: Marijuana     Comment: "last marijuana was ~ 2012" (12/29/2013)  . Sexual Activity: Yes   Other Topics Concern  . Not on file   Social History Narrative    Review of Systems: Pertinent items are noted in HPI.  Physical Exam: Blood pressure 124/80, pulse 83, temperature 98.4 F (36.9 C), temperature source Oral, resp. rate 24, height 6\' 2"  (1.88 m), weight 221 lb (100.245 kg), SpO2 100 %. General: resting comfortably in bed, in no acute distress HEENT: PERRL, EOMI, no scleral icterus, dry mucous membranes Cardiac: RRR, no rubs, murmurs or gallops Pulm: mild inspiratory wheezing in bilateral lung fields, moving normal volumes of air Abd: soft, mild guarding, tender to palpation throughout but most remarkable in the epigastrium, no distention, normal bowel sounds throughout all 4 quadrants, tenderness throughout the back Ext: warm and well perfused, no pedal edema Neuro: alert and oriented X3, cranial nerves II-XII grossly intact Skin: no  rashes or lesions noted Psych: appropriate affect and cognition  Lab results: Basic Metabolic Panel:  Recent Labs  40/98/1109/05/19 0018  NA 135*  K 3.5*  CL 94*  CO2 24  GLUCOSE 97  BUN 9  CREATININE 0.71  CALCIUM 9.8   Liver Function Tests:  Recent Labs  03/05/14 0018 03/05/14 2110  AST 296* 165*  ALT 80* 71*  ALKPHOS 93 95  BILITOT 1.5* 1.6*  PROT 8.1 8.6*  ALBUMIN 4.0 4.4    Recent Labs  03/05/14 0018 03/05/14 2110  LIPASE 223* 810*   No results for input(s): AMMONIA in the last 72 hours. CBC:  Recent Labs  03/05/14 0018  WBC 6.3  NEUTROABS 4.2  HGB 13.2  HCT 39.2  MCV 75.7*  PLT 154   Cardiac Enzymes: No results for input(s): CKTOTAL, CKMB, CKMBINDEX, TROPONINI in the last 72 hours. BNP: No results for input(s): PROBNP in the last 72 hours. D-Dimer: No results for input(s): DDIMER in the last 72 hours. CBG: No  results for input(s): GLUCAP in the last 72 hours. Hemoglobin A1C: No results for input(s): HGBA1C in the last 72 hours. Fasting Lipid Panel: No results for input(s): CHOL, HDL, LDLCALC, TRIG, CHOLHDL, LDLDIRECT in the last 72 hours. Thyroid Function Tests: No results for input(s): TSH, T4TOTAL, FREET4, T3FREE, THYROIDAB in the last 72 hours. Anemia Panel: No results for input(s): VITAMINB12, FOLATE, FERRITIN, TIBC, IRON, RETICCTPCT in the last 72 hours. Coagulation: No results for input(s): LABPROT, INR in the last 72 hours. Urine Drug Screen: Drugs of Abuse     Component Value Date/Time   LABOPIA POSITIVE* 12/29/2013 1050   COCAINSCRNUR NONE DETECTED 12/29/2013 1050   LABBENZ NONE DETECTED 12/29/2013 1050   AMPHETMU NONE DETECTED 12/29/2013 1050   THCU NONE DETECTED 12/29/2013 1050   LABBARB NONE DETECTED 12/29/2013 1050    Alcohol Level:  Recent Labs  03/05/14 0058  ETH 91*   Urinalysis:  Recent Labs  03/05/14 0113 03/05/14 2047  COLORURINE ORANGE* AMBER*  LABSPEC 1.030 1.019  PHURINE 7.0 8.0  GLUCOSEU NEGATIVE  NEGATIVE  HGBUR NEGATIVE NEGATIVE  BILIRUBINUR SMALL* SMALL*  KETONESUR 15* 40*  PROTEINUR NEGATIVE NEGATIVE  UROBILINOGEN 2.0* 1.0  NITRITE NEGATIVE NEGATIVE  LEUKOCYTESUR TRACE* NEGATIVE   Imaging results:  No results found.  Assessment & Plan by Problem: Principal Problem:   Acute alcoholic pancreatitis Active Problems:   Alcohol abuse   Essential hypertension  Patient is a 38 year old with history of alcohol abuse, pancreatitis, hypertension, tobacco abuse, and asthma who presents alcoholic pancreatitis along with alcoholic hepatitis and suspected alcoholic ketoacidosis.  Pancreatitis: Patient presenting with abdominal pain with nausea and vomiting, but without any bowel movements in the last several days. Patient with an extensive history of alcohol abuse. Lipase is upon initial evaluation at 223, up to 810 upon second evaluation, consistent with previous elevations and admissions. CT scan from 12/29/2013 showing findings consistent with pancreatitis without pancreatic necrosis or pseudocyst formation. Patient currently hemodynamically stable with low suspicion for complications warranting repeat CT at this point.  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 No leukocytosis or left shift. Very mild alcoholic hepatitis with a Maddrey's Discriminant Function of 2.1. No indication for pharmacotherapy at this point. Denies any history of alcohol withdrawal in the past.  - Thiamine, Folic acid, and multivitamin  - CIWA   - Repeat ethanol level  Anion gap metabolic acidosis:  Likely secondary to alcoholic ketoacidosis. Anion gap at 21, with a bicarbonate of 26. Likely anion gap metabolic acidosis with metabolic alkalosis  from vomiting. Ketones in the urine. Will rule out other causes of anion gap metabolic acidosis.   - lactic acid  - Aspirin level  Hyponatremia: likely secondary to GI loss and hypovolemia. Physical exam remarkable for dry mucous membranes.   - Will continue to monitor status post normal saline bolus  Hypertension: normotensive upon evaluation at 124/80.  - Hold home hydrochlorothiazide 25 mg daily for now  Asthma: no current complaints but mild wheezing on exam.  - Albuterol  Tobacco abuse:   - nicotine patch per patient request  Diet: NPO Prophylaxis: heparin SQ Code: Full  Dispo: Disposition is deferred at this time, awaiting improvement of current medical problems. Anticipated discharge in approximately  2 day(s).   The patient does have a current PCP (Jill Alexanders, MD) and does need an [facility] hospital follow-up appointment after discharge.  The patient does not have transportation limitations that hinder transportation to clinic appointments.  Signed: Harold Barban, M.D., Ph.D. Internal Medicine Teaching Service, PGY-1 03/05/2014, 10:27 PM

## 2014-03-05 NOTE — ED Notes (Signed)
Pt states a hx of pancreatitis. States he drank a beer and ate a cheeseburger from [employer].

## 2014-03-06 ENCOUNTER — Encounter (HOSPITAL_COMMUNITY): Payer: Self-pay | Admitting: *Deleted

## 2014-03-06 DIAGNOSIS — R109 Unspecified abdominal pain: Secondary | ICD-10-CM

## 2014-03-06 DIAGNOSIS — R112 Nausea with vomiting, unspecified: Secondary | ICD-10-CM

## 2014-03-06 LAB — CBC
HCT: 35.8 % — ABNORMAL LOW (ref 39.0–52.0)
Hemoglobin: 12.1 g/dL — ABNORMAL LOW (ref 13.0–17.0)
MCH: 25.6 pg — ABNORMAL LOW (ref 26.0–34.0)
MCHC: 33.8 g/dL (ref 30.0–36.0)
MCV: 75.7 fL — ABNORMAL LOW (ref 78.0–100.0)
PLATELETS: 142 10*3/uL — AB (ref 150–400)
RBC: 4.73 MIL/uL (ref 4.22–5.81)
RDW: 15 % (ref 11.5–15.5)
WBC: 6.1 10*3/uL (ref 4.0–10.5)

## 2014-03-06 LAB — ETHANOL: Alcohol, Ethyl (B): <11 mg/dL (ref 0–11)

## 2014-03-06 LAB — LACTIC ACID, PLASMA: Lactic Acid, Venous: 0.9 mmol/L (ref 0.5–2.2)

## 2014-03-06 LAB — COMPREHENSIVE METABOLIC PANEL
ALT: 52 U/L (ref 0–53)
AST: 93 U/L — ABNORMAL HIGH (ref 0–37)
Albumin: 3.6 g/dL (ref 3.5–5.2)
Alkaline Phosphatase: 74 U/L (ref 39–117)
Anion gap: 18 — ABNORMAL HIGH (ref 5–15)
BUN: 8 mg/dL (ref 6–23)
CHLORIDE: 89 meq/L — AB (ref 96–112)
CO2: 26 mEq/L (ref 19–32)
CREATININE: 0.61 mg/dL (ref 0.50–1.35)
Calcium: 9.1 mg/dL (ref 8.4–10.5)
GFR calc non Af Amer: >90 mL/min (ref >90)
GLUCOSE: 77 mg/dL (ref 70–99)
Potassium: 3.6 mEq/L — ABNORMAL LOW (ref 3.7–5.3)
Sodium: 133 mEq/L — ABNORMAL LOW (ref 137–147)
Total Bilirubin: 1.2 mg/dL (ref 0.3–1.2)
Total Protein: 7.1 g/dL (ref 6.0–8.3)

## 2014-03-06 LAB — OSMOLALITY: Osmolality: 274 mOsm/kg — ABNORMAL LOW (ref 275–300)

## 2014-03-06 LAB — ACETAMINOPHEN LEVEL: Acetaminophen (Tylenol), Serum: <15 ug/mL (ref 10–30)

## 2014-03-06 LAB — SALICYLATE LEVEL: Salicylate Lvl: <2 mg/dL — ABNORMAL LOW (ref 2.8–20.0)

## 2014-03-06 MED ORDER — LORAZEPAM 2 MG/ML IJ SOLN
1.0000 mg | Freq: Four times a day (QID) | INTRAMUSCULAR | Status: DC | PRN
  Administered 2014-03-08: 1 mg via INTRAVENOUS
  Filled 2014-03-06: qty 1

## 2014-03-06 MED ORDER — POTASSIUM CHLORIDE 10 MEQ/100ML IV SOLN
10.0000 meq | INTRAVENOUS | Status: AC
  Administered 2014-03-06 (×3): 10 meq via INTRAVENOUS
  Filled 2014-03-06 (×3): qty 100

## 2014-03-06 MED ORDER — VITAMIN B-1 100 MG PO TABS
100.0000 mg | ORAL_TABLET | Freq: Every day | ORAL | Status: DC
  Administered 2014-03-06 – 2014-03-08 (×3): 100 mg via ORAL
  Filled 2014-03-06 (×3): qty 1

## 2014-03-06 MED ORDER — FOLIC ACID 1 MG PO TABS
1.0000 mg | ORAL_TABLET | Freq: Every day | ORAL | Status: DC
  Administered 2014-03-06 – 2014-03-08 (×3): 1 mg via ORAL
  Filled 2014-03-06 (×3): qty 1

## 2014-03-06 MED ORDER — ADULT MULTIVITAMIN W/MINERALS CH
1.0000 | ORAL_TABLET | Freq: Every day | ORAL | Status: DC
  Administered 2014-03-06 – 2014-03-08 (×3): 1 via ORAL
  Filled 2014-03-06 (×3): qty 1

## 2014-03-06 MED ORDER — HEPARIN SODIUM (PORCINE) 5000 UNIT/ML IJ SOLN
5000.0000 [IU] | Freq: Three times a day (TID) | INTRAMUSCULAR | Status: DC
  Filled 2014-03-06 (×9): qty 1

## 2014-03-06 MED ORDER — PROMETHAZINE HCL 25 MG/ML IJ SOLN
12.5000 mg | Freq: Four times a day (QID) | INTRAMUSCULAR | Status: DC | PRN
  Administered 2014-03-06: 12.5 mg via INTRAVENOUS
  Filled 2014-03-06: qty 1

## 2014-03-06 MED ORDER — ALBUTEROL SULFATE (2.5 MG/3ML) 0.083% IN NEBU: 2.5000 mg | INHALATION_SOLUTION | Freq: Four times a day (QID) | RESPIRATORY_TRACT | Status: DC | PRN

## 2014-03-06 MED ORDER — SODIUM CHLORIDE 0.9 % IV SOLN
INTRAVENOUS | Status: DC
  Administered 2014-03-06 – 2014-03-07 (×5): via INTRAVENOUS

## 2014-03-06 MED ORDER — THIAMINE HCL 100 MG/ML IJ SOLN
100.0000 mg | Freq: Every day | INTRAMUSCULAR | Status: DC
  Filled 2014-03-06 (×3): qty 1

## 2014-03-06 MED ORDER — LORAZEPAM 1 MG PO TABS: 1.0000 mg | ORAL_TABLET | Freq: Four times a day (QID) | ORAL | Status: DC | PRN

## 2014-03-06 NOTE — Progress Notes (Signed)
Subjective:  Patient still experiencing significant diffuse abdominal pain with radiation to the back. Pain is most severe in the epigastrium. He had one episode of emesis, no bowel movements. overnight.  Objective: Vital signs in last 24 hours: Filed Vitals:   03/06/14 0000 03/06/14 0030 03/06/14 0118 03/06/14 0607  BP: 122/82 122/73 127/87 119/73  Pulse: 78 73 68 69  Temp:   97.7 F (36.5 C) 97.4 F (36.3 C)  TempSrc:   Oral Oral  Resp: 23 15 18 18   Height:      Weight:      SpO2: 96% 94% 97% 93%   Weight change:   Intake/Output Summary (Last 24 hours) at 03/06/14 1113 Last data filed at 03/06/14 [id number]  Gross per 24 hour  Intake    120 ml  Output      0 ml  Net    120 ml   BP 119/73 mmHg  Pulse 69  Temp(Src) 97.4 F (36.3 C) (Oral)  Resp 18  Ht 6\' 2"  (1.88 m)  Wt 100.245 kg (221 lb)  BMI 28.36 kg/m2  SpO2 93%  General Appearance:    Alert, cooperative, no distress, appears stated age  Back:     some tenderness to palpation of the back  Lungs:     Clear to auscultation bilaterally, respirations unlabored  Heart:    Regular rate and rhythm, S1 and S2 normal, no murmur, rub   or gallop  Abdomen:     Diffusely tender to palpation and percussion, most tender in  the epigastrium. Bowel sounds normal.  Extremities:   Extremities normal, atraumatic, no cyanosis or edema  Pulses:   2+ and symmetric all extremities  Skin:   Skin color, texture, turgor normal, no rashes or lesions  Lymph nodes:   Cervical, supraclavicular, and axillary nodes normal  Neurologic:   CNII-XII grossly intact. Normal strength, sensation and reflexes throughout   Lab Results: Results for orders placed or performed during the hospital encounter of 03/05/14 (from the past 24 hour(s))  Urinalysis, Routine w reflex microscopic     Status: Abnormal   Collection Time: 03/05/14  8:47 PM  Result Value Ref Range   Color, Urine AMBER (A) YELLOW   APPearance CLEAR CLEAR   Specific Gravity, Urine 1.019  1.005 - 1.030   pH 8.0 5.0 - 8.0   Glucose, UA NEGATIVE NEGATIVE mg/dL   Hgb urine dipstick NEGATIVE NEGATIVE   Bilirubin Urine SMALL (A) NEGATIVE   Ketones, ur 40 (A) NEGATIVE mg/dL   Protein, ur NEGATIVE NEGATIVE mg/dL   Urobilinogen, UA 1.0 0.0 - 1.0 mg/dL   Nitrite NEGATIVE NEGATIVE   Leukocytes, UA NEGATIVE NEGATIVE  Hepatic function panel     Status: Abnormal   Collection Time: 03/05/14  9:10 PM  Result Value Ref Range   Total Protein 8.6 (H) 6.0 - 8.3 g/dL   Albumin 4.4 3.5 - 5.2 g/dL   AST 960165 (H) 0 - 37 U/L   ALT 71 (H) 0 - 53 U/L   Alkaline Phosphatase 95 39 - 117 U/L   Total Bilirubin 1.6 (H) 0.3 - 1.2 mg/dL   Bilirubin, Direct 0.6 (H) 0.0 - 0.3 mg/dL   Indirect Bilirubin 1.0 (H) 0.3 - 0.9 mg/dL  Lipase, blood     Status: Abnormal   Collection Time: 03/05/14  9:10 PM  Result Value Ref Range   Lipase 810 (H) 11 - 59 U/L  Basic metabolic panel     Status: Abnormal   Collection Time:  03/05/14  9:10 PM  Result Value Ref Range   Sodium 136 (L) 137 - 147 mEq/L   Potassium 3.7 3.7 - 5.3 mEq/L   Chloride 89 (L) 96 - 112 mEq/L   CO2 26 19 - 32 mEq/L   Glucose, Bld 82 70 - 99 mg/dL   BUN 9 6 - 23 mg/dL   Creatinine, Ser 6.96 0.50 - 1.35 mg/dL   Calcium 29.5 8.4 - 28.4 mg/dL   GFR calc non Af Amer >90 >90 mL/min   GFR calc Af Amer >90 >90 mL/min   Anion gap 21 (H) 5 - 15  Lipid panel     Status: Abnormal   Collection Time: 03/05/14  9:10 PM  Result Value Ref Range   Cholesterol 220 (H) 0 - 200 mg/dL   Triglycerides 71 <132 mg/dL   HDL 440 >10 mg/dL   Total CHOL/HDL Ratio 1.5 RATIO   VLDL 14 0 - 40 mg/dL   LDL Cholesterol 61 0 - 99 mg/dL  CBC     Status: Abnormal   Collection Time: 03/05/14 10:10 PM  Result Value Ref Range   WBC 7.5 4.0 - 10.5 K/uL   RBC 4.96 4.22 - 5.81 MIL/uL   Hemoglobin 12.4 (L) 13.0 - 17.0 g/dL   HCT 27.2 (L) 53.6 - 64.4 %   MCV 75.2 (L) 78.0 - 100.0 fL   MCH 25.0 (L) 26.0 - 34.0 pg   MCHC 33.2 30.0 - 36.0 g/dL   RDW 03.4 74.2 - 59.5 %    Platelets 137 (L) 150 - 400 K/uL  Protime-INR     Status: None   Collection Time: 03/05/14 10:10 PM  Result Value Ref Range   Prothrombin Time 13.1 11.6 - 15.2 seconds   INR 0.99 0.00 - 1.49  Magnesium     Status: None   Collection Time: 03/05/14 10:10 PM  Result Value Ref Range   Magnesium 2.0 1.5 - 2.5 mg/dL  Ethanol     Status: None   Collection Time: 03/05/14 11:30 PM  Result Value Ref Range   Alcohol, Ethyl (B) <11 0 - 11 mg/dL  Lactic acid, plasma     Status: None   Collection Time: 03/05/14 11:30 PM  Result Value Ref Range   Lactic Acid, Venous 0.9 0.5 - 2.2 mmol/L  Salicylate level     Status: Abnormal   Collection Time: 03/05/14 11:30 PM  Result Value Ref Range   Salicylate Lvl <2.0 (L) 2.8 - 20.0 mg/dL  Acetaminophen level     Status: None   Collection Time: 03/05/14 11:30 PM  Result Value Ref Range   Acetaminophen (Tylenol), Serum <15.0 10 - 30 ug/mL  Osmolality     Status: Abnormal   Collection Time: 03/05/14 11:30 PM  Result Value Ref Range   Osmolality 274 (L) 275 - 300 mOsm/kg  Comprehensive metabolic panel     Status: Abnormal   Collection Time: 03/06/14  4:10 AM  Result Value Ref Range   Sodium 133 (L) 137 - 147 mEq/L   Potassium 3.6 (L) 3.7 - 5.3 mEq/L   Chloride 89 (L) 96 - 112 mEq/L   CO2 26 19 - 32 mEq/L   Glucose, Bld 77 70 - 99 mg/dL   BUN 8 6 - 23 mg/dL   Creatinine, Ser 6.38 0.50 - 1.35 mg/dL   Calcium 9.1 8.4 - 75.6 mg/dL   Total Protein 7.1 6.0 - 8.3 g/dL   Albumin 3.6 3.5 - 5.2 g/dL   AST 93 (  H) 0 - 37 U/L   ALT 52 0 - 53 U/L   Alkaline Phosphatase 74 39 - 117 U/L   Total Bilirubin 1.2 0.3 - 1.2 mg/dL   GFR calc non Af Amer >90 >90 mL/min   GFR calc Af Amer >90 >90 mL/min   Anion gap 18 (H) 5 - 15  CBC     Status: Abnormal   Collection Time: 03/06/14  4:10 AM  Result Value Ref Range   WBC 6.1 4.0 - 10.5 K/uL   RBC 4.73 4.22 - 5.81 MIL/uL   Hemoglobin 12.1 (L) 13.0 - 17.0 g/dL   HCT 16.135.8 (L) 09.639.0 - 04.552.0 %   MCV 75.7 (L) 78.0 - 100.0  fL   MCH 25.6 (L) 26.0 - 34.0 pg   MCHC 33.8 30.0 - 36.0 g/dL   RDW 40.915.0 81.111.5 - 91.415.5 %   Platelets 142 (L) 150 - 400 K/uL   Micro Results: No results found for this or any previous visit (from the past 240 hour(s)). Studies/Results: No results found. Medications: I have reviewed the patient's current medications. Scheduled Meds: . folic acid  1 mg Oral Daily  . heparin  5,000 Units Subcutaneous 3 times per day  . multivitamin with minerals  1 tablet Oral Daily  . thiamine  100 mg Oral Daily   Or  . thiamine  100 mg Intravenous Daily   Continuous Infusions: . sodium chloride 125 mL/hr at 03/06/14 1010   PRN Meds:.albuterol, HYDROmorphone (DILAUDID) injection, LORazepam **OR** LORazepam, nicotine, promethazine Assessment/Plan: Principal Problem:   Acute alcoholic pancreatitis Active Problems:   Alcohol abuse   Essential hypertension  Patient is a 38 year old with history of alcohol abuse, pancreatitis, hypertension, tobacco abuse, and asthma who presents alcoholic pancreatitis along with alcoholic hepatitis and suspected alcoholic ketoacidosis.  Alcohol Induced Pancreatitis: Patient presenting with abdominal pain with nausea and vomiting, but without any bowel movements in the last several days. Patient with an extensive history of alcohol abuse. Lipase is upon initial evaluation at 223, up to 810 upon second evaluation, consistent with previous elevations and admissions. CT scan from 12/29/2013 showing findings consistent with pancreatitis without pancreatic necrosis or pseudocyst formation. Patient currently hemodynamically stable with low suspicion for complications warranting repeat CT at this point. 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 Normal serum lactate, aspirin level <2.0.  -Anion gap at 18 this AM  Hyponatremia: likely secondary to GI loss and hypovolemia. Sodium down to 133 this AM, received 2L NS. - Continue to monitor  Hypertension: normotensive upon admission. - Hold home hydrochlorothiazide 25 mg daily for now  Asthma: - Albuterol  Tobacco abuse:  - nicotine patch per patient request  Diet: NPO Prophylaxis: heparin SQ Code: Full  Dispo: Disposition is deferred at this time, awaiting improvement of current medical problems. Anticipated discharge in approximately 2 day(s).   The patient does have a current PCP (Jill AlexandersAlexa Richardson, MD) and [facility].  The patient does not have transportation limitations that hinder transportation to clinic appointments.  This is a Psychologist, occupationalMedical Student Note.  The care of the patient was discussed with Dr. Pete Pelt and the assessment and plan formulated with their assistance.  Please see their attached note for official documentation of the daily encounter.   LOS: 1 day   Tamera Punt, Med Student 03/06/2014, 11:13 AM

## 2014-03-06 NOTE — Plan of Care (Signed)
Problem: Consults Goal: Pancreatitis Patient Education See Patient Education Module for education specifics. Outcome: Completed/Met Date Met:  03/06/14  Problem: Phase I Progression Outcomes Goal: Pain controlled with appropriate interventions Outcome: Completed/Met Date Met:  03/06/14 Goal: OOB as tolerated unless otherwise ordered Outcome: Completed/Met Date Met:  03/06/14 Goal: Initial discharge plan identified Outcome: Completed/Met Date Met:  03/06/14 Goal: Voiding-avoid urinary catheter unless indicated Outcome: Completed/Met Date Met:  03/06/14 Goal: Hemodynamically stable Outcome: Completed/Met Date Met:  03/06/14     

## 2014-03-06 NOTE — Progress Notes (Signed)
Subjective: Still having abdominal pain. No vomiting, on IVF. No events overnight.  Objective: Vital signs in last 24 hours: Filed Vitals:   03/06/14 0000 03/06/14 0030 03/06/14 0118 03/06/14 0607  BP: 122/82 122/73 127/87 119/73  Pulse: 78 73 68 69  Temp:   97.7 F (36.5 C) 97.4 F (36.3 C)  TempSrc:   Oral Oral  Resp: 23 15 18 18   Height:      Weight:      SpO2: 96% 94% 97% 93%   Weight change:   Intake/Output Summary (Last 24 hours) at 03/06/14 1209 Last data filed at 03/06/14 [id number]  Gross per 24 hour  Intake    120 ml  Output      0 ml  Net    120 ml    Physical Exam-  General appearance: alert, cooperative, appears stated age and no distress Head: Normocephalic, without obvious abnormality, atraumatic Back: symmetric, no curvature. ROM normal. No CVA tenderness. Chest wall: no tenderness Heart: regular rate and rhythm, S1, S2 normal, no murmur, click, rub or gallop Extremities: extremities normal, atraumatic, no cyanosis or edema   Lab Results: Basic Metabolic Panel:  Recent Labs Lab 03/05/14 2110 03/05/14 2210 03/06/14 0410  NA 136*  --  133*  K 3.7  --  3.6*  CL 89*  --  89*  CO2 26  --  26  GLUCOSE 82  --  77  BUN 9  --  8  CREATININE 0.69  --  0.61  CALCIUM 10.5  --  9.1  MG  --  2.0  --    Liver Function Tests:  Recent Labs Lab 03/05/14 2110 03/06/14 0410  AST 165* 93*  ALT 71* 52  ALKPHOS 95 74  BILITOT 1.6* 1.2  PROT 8.6* 7.1  ALBUMIN 4.4 3.6    Recent Labs Lab 03/05/14 0018 03/05/14 2110  LIPASE 223* 810*    CBC:  Recent Labs Lab 03/05/14 0018 03/05/14 2210 03/06/14 0410  WBC 6.3 7.5 6.1  NEUTROABS 4.2  --   --   HGB 13.2 12.4* 12.1*  HCT 39.2 37.3* 35.8*  MCV 75.7* 75.2* 75.7*  PLT 154 137* 142*   Fasting Lipid Panel:  Recent Labs Lab 03/05/14 2110  CHOL 220*  HDL 145  LDLCALC 61  TRIG 71  CHOLHDL 1.5   Coagulation:  Recent Labs Lab 03/05/14 2210  LABPROT 13.1  INR 0.99   Urine Drug  Screen: Drugs of Abuse     Component Value Date/Time   LABOPIA POSITIVE* 12/29/2013 1050   COCAINSCRNUR NONE DETECTED 12/29/2013 1050   LABBENZ NONE DETECTED 12/29/2013 1050   AMPHETMU NONE DETECTED 12/29/2013 1050   THCU NONE DETECTED 12/29/2013 1050   LABBARB NONE DETECTED 12/29/2013 1050    Alcohol Level:  Recent Labs Lab 03/05/14 0058 03/05/14 2330  ETH 91* <11   Urinalysis:  Recent Labs Lab 03/05/14 0113 03/05/14 2047  COLORURINE ORANGE* AMBER*  LABSPEC 1.030 1.019  PHURINE 7.0 8.0  GLUCOSEU NEGATIVE NEGATIVE  HGBUR NEGATIVE NEGATIVE  BILIRUBINUR SMALL* SMALL*  KETONESUR 15* 40*  PROTEINUR NEGATIVE NEGATIVE  UROBILINOGEN 2.0* 1.0  NITRITE NEGATIVE NEGATIVE  LEUKOCYTESUR TRACE* NEGATIVE   Medications: I have reviewed the patient's current medications. Scheduled Meds: . folic acid  1 mg Oral Daily  . heparin  5,000 Units Subcutaneous 3 times per day  . multivitamin with minerals  1 tablet Oral Daily  . thiamine  100 mg Oral Daily   Or  . thiamine  100  mg Intravenous Daily   Continuous Infusions: . sodium chloride 125 mL/hr at 03/06/14 1010   PRN Meds:.albuterol, HYDROmorphone (DILAUDID) injection, LORazepam **OR** LORazepam, nicotine, promethazine   Assessment/Plan: Pancreatitis: Abdominal pain with nausea and vomiting, likely precipatated by alcohol abuse. Lipase 810, consistent with previous elevations and admissions.  - Dilaudid 1 mg every 4 hours PRN - normal saline at 125 mL per hour - Nothing by mouth, start diet when abdominal pain improves. 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 Denies any history of alcohol withdrawal in the past. Lactic acid and salicylates normal. - Thiamine, Folic acid, and multivitamin - CIWA  - Bmet tomorrow.  Hypertension: normotensive upon evaluation at 124/80. - Hold home hydrochlorothiazide 25  mg daily for now  Asthma: no current complaints but mild wheezing on exam. - Albuterol  Tobacco abuse:  - nicotine patch per patient request  Diet: NPO Prophylaxis: heparin SQ Code: Full  Dispo: Disposition is deferred at this time, awaiting improvement of current medical problems.  Anticipated discharge in approximately 1-2 day(s).   The patient does have a current PCP (Jill AlexandersAlexa Richardson, MD) and does need [facility]-up [facility] after discharge.  The patient does not know have transportation limitations that hinder transportation to clinic appointments.  .Services Needed at time of discharge: Y = Yes, Blank = No PT:   OT:   RN:   Equipment:   Other:     LOS: 1 day   Onnie BoerEjiroghene E Bert Givans, MD 03/06/2014, 12:09 PM

## 2014-03-07 LAB — BASIC METABOLIC PANEL
ANION GAP: 17 — AB (ref 5–15)
BUN: 5 mg/dL — ABNORMAL LOW (ref 6–23)
CHLORIDE: 94 meq/L — AB (ref 96–112)
CO2: 24 mEq/L (ref 19–32)
Calcium: 9.2 mg/dL (ref 8.4–10.5)
Creatinine, Ser: 0.57 mg/dL (ref 0.50–1.35)
GFR calc Af Amer: >90 mL/min (ref >90)
Glucose, Bld: 75 mg/dL (ref 70–99)
Potassium: 4 mEq/L (ref 3.7–5.3)
Sodium: 135 mEq/L — ABNORMAL LOW (ref 137–147)

## 2014-03-07 LAB — GLUCOSE, CAPILLARY: Glucose-Capillary: 74 mg/dL (ref 70–99)

## 2014-03-07 MED ORDER — KCL IN DEXTROSE-NACL 20-5-0.9 MEQ/L-%-% IV SOLN
INTRAVENOUS | Status: DC
  Administered 2014-03-07 – 2014-03-08 (×3): via INTRAVENOUS
  Filled 2014-03-07 (×3): qty 1000

## 2014-03-07 MED ORDER — KCL IN DEXTROSE-NACL 20-5-0.9 MEQ/L-%-% IV SOLN: INTRAVENOUS | Status: DC

## 2014-03-07 NOTE — Progress Notes (Signed)
Subjective: Feels better today, walking around room. No vomiting, abdominal pain better on standing up.  Objective: Vital signs in last 24 hours: Filed Vitals:   03/06/14 0607 03/06/14 1223 03/06/14 2128 03/07/14 0515  BP: 119/73 127/79 141/92 136/90  Pulse: 69 73 94 85  Temp: 97.4 F (36.3 C) 97.5 F (36.4 C) 100 F (37.8 C) 97.9 F (36.6 C)  TempSrc: Oral Oral Oral Oral  Resp: 18 18 17 16   Height:      Weight:      SpO2: 93% 96% 100% 96%   Weight change:   Intake/Output Summary (Last 24 hours) at 03/07/14 1129 Last data filed at 03/07/14 0700  Gross per 24 hour  Intake   1680 ml  Output      2 ml  Net   1678 ml    Physical Exam-  General appearance: alert, cooperative, appears stated age and no distress Head: Normocephalic, without obvious abnormality, atraumatic,  Back: symmetric, no curvature. ROM normal. No CVA tenderness. Chest wall: no tenderness Heart: regular rate and rhythm, S1, S2 normal, no murmur, click, rubs or gallop Extremities: extremities normal, atraumatic, no cyanosis or edema   Lab Results: Basic Metabolic Panel:  Recent Labs Lab 03/05/14 2210 03/06/14 0410 03/07/14 0501  NA  --  133* 135*  K  --  3.6* 4.0  CL  --  89* 94*  CO2  --  26 24  GLUCOSE  --  77 75  BUN  --  8 5*  CREATININE  --  0.61 0.57  CALCIUM  --  9.1 9.2  MG 2.0  --   --    Liver Function Tests:  Recent Labs Lab 03/05/14 2110 03/06/14 0410  AST 165* 93*  ALT 71* 52  ALKPHOS 95 74  BILITOT 1.6* 1.2  PROT 8.6* 7.1  ALBUMIN 4.4 3.6    Recent Labs Lab 03/05/14 0018 03/05/14 2110  LIPASE 223* 810*   CBC:  Recent Labs Lab 03/05/14 0018 03/05/14 2210 03/06/14 0410  WBC 6.3 7.5 6.1  NEUTROABS 4.2  --   --   HGB 13.2 12.4* 12.1*  HCT 39.2 37.3* 35.8*  MCV 75.7* 75.2* 75.7*  PLT 154 137* 142*   Fasting Lipid Panel:  Recent Labs Lab 03/05/14 2110  CHOL 220*  HDL 145  LDLCALC 61  TRIG 71  CHOLHDL 1.5   Coagulation:  Recent Labs Lab  03/05/14 2210  LABPROT 13.1  INR 0.99   Urine Drug Screen: Drugs of Abuse     Component Value Date/Time   LABOPIA POSITIVE* 12/29/2013 1050   COCAINSCRNUR NONE DETECTED 12/29/2013 1050   LABBENZ NONE DETECTED 12/29/2013 1050   AMPHETMU NONE DETECTED 12/29/2013 1050   THCU NONE DETECTED 12/29/2013 1050   LABBARB NONE DETECTED 12/29/2013 1050    Alcohol Level:  Recent Labs Lab 03/05/14 0058 03/05/14 2330  ETH 91* <11   Urinalysis:  Recent Labs Lab 03/05/14 0113 03/05/14 2047  COLORURINE ORANGE* AMBER*  LABSPEC 1.030 1.019  PHURINE 7.0 8.0  GLUCOSEU NEGATIVE NEGATIVE  HGBUR NEGATIVE NEGATIVE  BILIRUBINUR SMALL* SMALL*  KETONESUR 15* 40*  PROTEINUR NEGATIVE NEGATIVE  UROBILINOGEN 2.0* 1.0  NITRITE NEGATIVE NEGATIVE  LEUKOCYTESUR TRACE* NEGATIVE   Medications: I have reviewed the patient's current medications. Scheduled Meds: . folic acid  1 mg Oral Daily  . heparin  5,000 Units Subcutaneous 3 times per day  . multivitamin with minerals  1 tablet Oral Daily  . thiamine  100 mg Oral Daily  Or  . thiamine  100 mg Intravenous Daily   Continuous Infusions: . sodium chloride 125 mL/hr at 03/07/14 1125   PRN Meds:.albuterol, HYDROmorphone (DILAUDID) injection, LORazepam **OR** LORazepam, nicotine, promethazine   Assessment/Plan: Pancreatitis: Resolving, Abdominal pain with nausea and vomiting, likely precipatated by alcohol abuse. Lipase 810, consistent with previous elevations and admissions.  - Dilaudid 1 mg every 4 hours PRN, switch to orals tomorrow -  Switch IVF to D5NS with 20meq of KCL At 17325mls/hr - Start clear liquid diet and advance as tolerated. 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 Denies any history of alcohol withdrawal in the past. Lactic acid and salicylates normal. - Still with AG- 17, ketoacidosis- will add dextrose to infusion, as suspected  alcoholic ketoacidosis - Bmet tomorrow.  Hypertension: normotensive upon evaluation at 124/80. - Hold home hydrochlorothiazide 25 mg daily for now, BP stable  Asthma: no current complaints but No wheezing on exam. - Albuterol Neb  Tobacco abuse:  - nicotine patch per patient request  Diet: Clear diet.  Prophylaxis: heparin SQ  Code: Full  Dispo: Disposition is deferred at this time, awaiting improvement of current medical problems.  Anticipated discharge in approximately 1-2 day(s).   The patient does have a current PCP (Jill AlexandersAlexa Richardson, MD) and [facility] after discharge.  The patient does not know have transportation limitations that hinder transportation to clinic appointments.  .Services Needed at time of discharge: Y = Yes, Blank = No PT:   OT:   RN:   Equipment:   Other:     LOS: 2 days   Onnie BoerEjiroghene E Eriyah Fernando, MD 03/07/2014, 11:29 AM

## 2014-03-07 NOTE — Progress Notes (Signed)
Subjective:  Still experiencing pain, however it is no longer constant. The pain will subside for some time however returns with movement and throbs significantly. The pain is in the same location and radiates diffusely to the back, however the patient believes this may be just soreness from sleeping on a different bed. No vomiting, no bowel movements, he is urinating without any complaints. He denies feeling fevers or chills.  Objective: Vital signs in last 24 hours: Filed Vitals:   03/06/14 0607 03/06/14 1223 03/06/14 2128 03/07/14 0515  BP: 119/73 127/79 141/92 136/90  Pulse: 69 73 94 85  Temp: 97.4 F (36.3 C) 97.5 F (36.4 C) 100 F (37.8 C) 97.9 F (36.6 C)  TempSrc: Oral Oral Oral Oral  Resp: 18 18 17 16   Height:      Weight:      SpO2: 93% 96% 100% 96%   Weight change:   Intake/Output Summary (Last 24 hours) at 03/07/14 0844 Last data filed at 03/07/14 0700  Gross per 24 hour  Intake   1800 ml  Output      2 ml  Net   1798 ml   BP 136/90 mmHg  Pulse 85  Temp(Src) 97.9 F (36.6 C) (Oral)  Resp 16  Ht 6\' 2"  (1.88 m)  Wt 100.245 kg (221 lb)  BMI 28.36 kg/m2  SpO2 96%  General Appearance:    Alert, cooperative, lying in bed  Back:     some tenderness to palpation of the back  Lungs:     Some scattered light wheezes, respirations unlabored  Heart:    Regular rate and rhythm, S1 and S2 normal, no murmur, rub   or gallop  Abdomen:     Diffusely tender to palpation and percussion, most tender in  the epigastrium. Bowel sounds normal.  Extremities:   Extremities normal, atraumatic, no cyanosis or edema  Pulses:   2+ and symmetric all extremities  Skin:   Skin color, texture, turgor normal, no rashes or lesions  Lymph nodes:   Cervical, supraclavicular, and axillary nodes normal  Neurologic:   CNII-XII grossly intact. Normal strength, sensation and reflexes throughout   Lab Results: Results for orders placed or performed during the hospital encounter of 03/05/14  (from the past 24 hour(s))  Basic metabolic panel     Status: Abnormal   Collection Time: 03/07/14  5:01 AM  Result Value Ref Range   Sodium 135 (L) 137 - 147 mEq/L   Potassium 4.0 3.7 - 5.3 mEq/L   Chloride 94 (L) 96 - 112 mEq/L   CO2 24 19 - 32 mEq/L   Glucose, Bld 75 70 - 99 mg/dL   BUN 5 (L) 6 - 23 mg/dL   Creatinine, Ser 6.440.57 0.50 - 1.35 mg/dL   Calcium 9.2 8.4 - 03.410.5 mg/dL   GFR calc non Af Amer >90 >90 mL/min   GFR calc Af Amer >90 >90 mL/min   Anion gap 17 (H) 5 - 15  Glucose, capillary     Status: None   Collection Time: 03/07/14  8:07 AM  Result Value Ref Range   Glucose-Capillary 74 70 - 99 mg/dL   Micro Results: No results found for this or any previous visit (from the past 240 hour(s)). Studies/Results: No results found. Medications: I have reviewed the patient's current medications. Scheduled Meds: . folic acid  1 mg Oral Daily  . heparin  5,000 Units Subcutaneous 3 times per day  . multivitamin with minerals  1 tablet Oral Daily  .  thiamine  100 mg Oral Daily   Or  . thiamine  100 mg Intravenous Daily   Continuous Infusions: . sodium chloride 125 mL/hr at 03/07/14 0331   PRN Meds:.albuterol, HYDROmorphone (DILAUDID) injection, LORazepam **OR** LORazepam, nicotine, promethazine Assessment/Plan: Principal Problem:   Acute alcoholic pancreatitis Active Problems:   Alcohol abuse   Essential hypertension  Patient is a 38 year old with history of alcohol abuse, pancreatitis, hypertension, tobacco abuse, and asthma who presents alcoholic pancreatitis along with alcoholic hepatitis and suspected alcoholic ketoacidosis.  Alcohol Induced Pancreatitis: Patient presenting with abdominal pain with nausea and vomiting, but without any bowel movements in the last several days. Patient with an extensive history of alcohol abuse. Lipase is upon initial evaluation at 223, up to 810 upon second evaluation, consistent with previous elevations and admissions. CT scan from  12/29/2013 showing findings consistent with pancreatitis without pancreatic necrosis or pseudocyst formation. Patient currently hemodynamically stable with low suspicion for complications warranting repeat CT at this point.  - Advance diet to clear liquids - Dilaudid 1 mg every 4 hours PRN -Switch IVF to D5NS with 20meq of KCL At 11125mls/hr  - Start clear liquid diet and advance as tolerated.   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  Hypertension: normotensive upon admission. - Hold home hydrochlorothiazide 25 mg daily for now  Asthma: - Albuterol  Tobacco abuse:  - nicotine patch per patient request  Diet: NPO Prophylaxis: heparin SQ Code: Full  Dispo: Disposition is deferred at this time, awaiting improvement of current medical problems. Anticipated discharge in approximately 1-2 day(s).   The patient does have a current PCP (Jill AlexandersAlexa Richardson, MD) and [facility] after discharge.  The patient does not have transportation limitations that hinder transportation to clinic appointments.  This is a Psychologist, occupationalMedical Student Note.  The care of the patient was discussed with Dr. Pete PeltEmokpai and the assessment and plan formulated with their assistance.  Please see their attached note for official documentation of the daily encounter.   LOS: 2 days   Tamera PuntBenjamin J Jiraiya Mcewan, Med Student 03/07/2014, 8:44 AM

## 2014-03-07 NOTE — Discharge Summary (Signed)
Medical Student Discharge Summary  Name: Riley Kim MRN: [id number] DOB: 09-27-1975 38 y.o. PCP: Jill AlexandersAlexa Richardson, MD  Date of Admission: 03/05/2014  7:54 PM Date of Discharge: 03/12/2014 Attending Physician: No att. providers found  Discharge Diagnosis:  1. Acute Alcoholic Pancreatitis  2. Alcohol Abuse  3. Alcoholic Hepatitis  Principal Problem:   Acute alcoholic pancreatitis Active Problems:   Alcohol abuse   Essential hypertension  Discharge Medications:   Medication List    TAKE these medications        albuterol 108 (90 BASE) MCG/ACT inhaler  Commonly known as:  PROVENTIL HFA;VENTOLIN HFA  Inhale 2 puffs into the lungs every 6 (six) hours as needed for wheezing or shortness of breath.     folic acid 1 MG tablet  Commonly known as:  FOLVITE  Take 1 tablet (1 mg total) by mouth daily.     hydrochlorothiazide 25 MG tablet  Commonly known as:  HYDRODIURIL  Take 1 tablet (25 mg total) by mouth daily.     HYDROcodone-acetaminophen 5-325 MG per tablet  Commonly known as:  NORCO/VICODIN  Take 1-2 tablets by mouth every 6 (six) hours as needed for moderate pain.     ondansetron 4 MG tablet  Commonly known as:  ZOFRAN  Take 1 tablet (4 mg total) by mouth every 6 (six) hours.     oxyCODONE-acetaminophen 5-325 MG per tablet  Commonly known as:  PERCOCET/ROXICET  Take 1 tablet by mouth every 4 (four) hours as needed for severe pain.     thiamine 100 MG tablet  Take 1 tablet (100 mg total) by mouth daily.        Disposition and follow-up:   [name].  At the hospital follow up visit please address:  1.  Resolution of symptoms- Abdominal pain and nausea. Alcohol cessation.  2.  Labs / imaging needed at time of follow-up: None  3.  Pending labs/ test needing follow-up: None.  Follow-up Appointments: Follow-up Information    Follow up with Jill Alexandersichardson, Alexa, MD.   Specialty:   Internal Medicine   Contact information:   [address] [place] [address] [phone]       Please follow up.   Why:  At 1.45pm.      2D Echo: None  Cardiac Cath: None  Admission HPI: Patient is a 38 year old with history of alcohol abuse, pancreatitis, hypertension, tobacco abuse, and asthma who presents with epigastric abdominal pain. Patient states that the pain first started yesterday after having a meal at [employer]. The pain is diffuse throughout his abdomen and radiates to his back diffusely. He states that the pain initially started at 4 out of 10 in severity but has progressively gotten worse to 10 out of 10 upon interview. He states that is sharp in quality and does not radiate anywhere else. Patient states that he has not had a bowel movement in 3-4 days. Patient denies that the abdominal pain is alleviated by antacids, food, exertion, or defecation. Patient denies any significant history of cardiovascular disease or previous abdominal surgeries. Patient denies any family history of bowel disorders. Patient also denies any significant use of NSAIDs.  Patient also states that he has had some vomiting. The vomiting only started after receiving some pain medications in the emergency department. Patient only reports vomiting once in the last day. The vomitus is remarkable for gastric juices and food, but patient did not notice  any blood. Patient denies any associated abdominal distention, heartburn, sensation of room spinning, headache, or sick contacts. Patient does report having some trace blood in his stool over the last couple weeks. Patient denies any associated fevers, diarrhea, or change in the diameter of stool.   Patient does report drinking about a half pint of liquor before presentation to the hospital. Ever since his previous admission, patient reports having cut down on his overall alcohol intake. Patient still smokes about 1 pack of cigarettes a day and has been  doing so for about 20 years. Patient denies any illicit drug use.   Patient was recently admitted between August 27 and August 29 of this year for alcohol-induced pancreatitis, which resolved with conservative management. The patient was initially discharged from the emergency department earlier this morning with Zofran and Percocet to be managed as an outpatient, but patient return to the emergency department probably with continued vomiting. Upon second evaluation in the emergency department, patient received 1 mg of Dilaudid and zofran, along with 2 L bolus of normal saline.   Hospital Course by problem list: Principal Problem:   Acute alcoholic pancreatitis Active Problems:   Alcohol abuse   Essential hypertension   1. Acute Alcoholic Pancreatitis- Pt was admitted with abdominal pain, and vomiting, lipase elevated at 810. Pt has had several  previous hospitalization for acute pancreatitis, without necrosis. CBC- WBC- 6.3, with a 1 time temperature reading of 100 F.  Imaging was not done, as pt appeared stable, and with conservative management with bowel rest and IVF,  And pain control with IV dilaudid, pt improved. Pts was acute pancreatitis was thought to be due to continued alcohol abuse- 1 pint of liquor and ate a cheese burger prior to present hospitalization. Pt showed significant improvement in his sym,ptoms and was therefore discharged when he started tolerating a regular diet.  2. 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 He had no events of withdrawal while admitted, his LFTs downtrended appropriately, and IVD5NS was added on day 2 for concern of alchoholic ketoacidosis, and anion gap closed with IVF. Social work consulted about community resources to  help pt with Alcohol cessation. Please follow up on alcohol use on discharge.  3. Hypertension The patient's HCTZ was held during the course of his admission and was restarted on discharge with no concerning events.  4. Asthma The patient had scattered wheezes consistently on physical exam during admission however never had any complaints or acute events. Home meds albuterol inhaler continued on admission and on discharge.  5. Tobacco abuse Per the patients request he was maintained on a nicotine patch while admitted.   Discharge Vitals:   BP 120/74 mmHg  Pulse 86  Temp(Src) 97.9 F (36.6 C) (Oral)  Resp 18  Ht 6\' 2"  (1.88 m)  Wt 221 lb (100.245 kg)  BMI 28.36 kg/m2  SpO2 93%  Signed: Onnie BoerEjiroghene E Kaymarie Wynn, MD 03/12/2014, 3:37 PM   Services Ordered on Discharge: None.  Equipment Ordered on Discharge: None.

## 2014-03-08 LAB — BASIC METABOLIC PANEL
Anion gap: 13 (ref 5–15)
BUN: 4 mg/dL — AB (ref 6–23)
CO2: 26 mEq/L (ref 19–32)
Calcium: 9.5 mg/dL (ref 8.4–10.5)
Chloride: 98 mEq/L (ref 96–112)
Creatinine, Ser: 0.61 mg/dL (ref 0.50–1.35)
GFR calc non Af Amer: >90 mL/min (ref >90)
Glucose, Bld: 88 mg/dL (ref 70–99)
POTASSIUM: 3.7 meq/L (ref 3.7–5.3)
SODIUM: 137 meq/L (ref 137–147)

## 2014-03-08 LAB — MAGNESIUM: MAGNESIUM: 2.1 mg/dL (ref 1.5–2.5)

## 2014-03-08 MED ORDER — HYDROCODONE-ACETAMINOPHEN 5-325 MG PO TABS
1.0000 | ORAL_TABLET | ORAL | Status: DC | PRN
  Administered 2014-03-08: 2 via ORAL
  Filled 2014-03-08: qty 2

## 2014-03-08 MED ORDER — HYDROCODONE-ACETAMINOPHEN 5-325 MG PO TABS: 1.0000 | ORAL_TABLET | Freq: Four times a day (QID) | ORAL | Status: DC | PRN

## 2014-03-08 MED ORDER — THIAMINE HCL 100 MG PO TABS: 100.0000 mg | ORAL_TABLET | Freq: Every day | ORAL | Status: DC

## 2014-03-08 MED ORDER — FOLIC ACID 1 MG PO TABS: 1.0000 mg | ORAL_TABLET | Freq: Every day | ORAL | Status: DC

## 2014-03-08 NOTE — Progress Notes (Signed)
Discharge instructions gone over with patient. Prescription given. Home medications gone over. Follow up appointment is made. Work note was given. Diet, activity, and signs and symptoms of worsening condition gone over. Social worker had given patient information on rehab and [employer] meetings. Patient encouraged to stop drinking. Patient verbalized understanding of information.

## 2014-03-08 NOTE — Discharge Instructions (Signed)
Finding Treatment for Alcohol and Drug Addiction It can be hard to find the right place to get professional treatment. Here are some important things to consider:  There are different types of treatment to choose from.  Some programs are live-in (residential) while others are not (outpatient). Sometimes a combination is offered.  No single type of program is right for everyone.  Most treatment programs involve a combination of education, counseling, and a 12-step, spiritually-based approach.  There are non-spiritually based programs (not 12-step).  Some treatment programs are government sponsored. They are geared for patients without private insurance.  Treatment programs can vary in many respects such as:  Cost and types of insurance accepted.  Types of on-site medical services offered.  Length of stay, setting, and size.  Overall philosophy of treatment. A person may need specialized treatment or have needs not addressed by all programs. For example, adolescents need treatment appropriate for their age. Other people have secondary disorders that must be managed as well. Secondary conditions can include mental illness, such as depression or diabetes. Often, a period of detoxification from alcohol or drugs is needed. This requires medical supervision and not all programs offer this. THINGS TO CONSIDER WHEN SELECTING A TREATMENT PROGRAM   Is the program certified by the appropriate government agency? Even private programs must be certified and employ certified professionals.  Does the program accept your insurance? If not, can a payment plan be set up?  Is the facility clean, organized, and well run? Do they allow you to speak with graduates who can share their treatment experience with you? Can you tour the facility? Can you meet with staff?  Does the program meet the full range of individual needs?  Does the treatment program address sexual orientation and physical disabilities?  Do they provide age, gender, and culturally appropriate treatment services?  Is treatment available in languages other than English?  Is long-term aftercare support or guidance encouraged and provided?  Is assessment of an individual's treatment plan ongoing to ensure it meets changing needs?  Does the program use strategies to encourage reluctant patients to remain in treatment long enough to increase the likelihood of success?  Does the program offer counseling (individual or group) and other behavioral therapies?  Does the program offer medicine as part of the treatment regimen, if needed?  Is there ongoing monitoring of possible relapse? Is there a defined relapse prevention program? Are services or referrals offered to family members to ensure they understand addiction and the recovery process? This would help them support the recovering individual.  Are 12-step meetings held at the center or is transport available for patients to attend outside meetings? In countries outside of the [place], Magazine features editorsee local directories for contact information for services in your area.   Acute Pancreatitis Acute pancreatitis is a disease in which the pancreas becomes suddenly inflamed. The pancreas is a large gland located behind your stomach. The pancreas produces enzymes that help digest food. The pancreas also releases the hormones glucagon and insulin that help regulate blood sugar. Damage to the pancreas occurs when the digestive enzymes from the pancreas are activated and begin attacking the pancreas before being released into the intestine. Most acute attacks last a couple of days and can cause serious complications. Some people become dehydrated and develop low blood pressure. In severe cases, bleeding into the pancreas can lead to shock and can be life-threatening. The lungs, heart, and kidneys may fail. CAUSES  Pancreatitis can happen to anyone.  In some cases, the cause is unknown. Most cases  are caused by:  Alcohol abuse.  Gallstones. Other less common causes are:  Certain medicines.  Exposure to certain chemicals.  Infection.  Damage caused by an accident (trauma).  Abdominal surgery. SYMPTOMS   Pain in the upper abdomen that may radiate to the back.  Tenderness and swelling of the abdomen.  Nausea and vomiting. DIAGNOSIS  Your caregiver will perform a physical exam. Blood and stool tests may be done to confirm the diagnosis. Imaging tests may also be done, such as X-rays, CT scans, or an ultrasound of the abdomen. TREATMENT  Treatment usually requires a stay in the hospital. Treatment may include:  Pain medicine.  Fluid replacement through an intravenous line (IV).  Placing a tube in the stomach to remove stomach contents and control vomiting.  Not eating for 3 or 4 days. This gives your pancreas a rest, because enzymes are not being produced that can cause further damage.  Antibiotic medicines if your condition is caused by an infection.  Surgery of the pancreas or gallbladder. HOME CARE INSTRUCTIONS   Follow the diet advised by your caregiver. This may involve avoiding alcohol and decreasing the amount of fat in your diet.  Eat smaller, more frequent meals. This reduces the amount of digestive juices the pancreas produces.  Drink enough fluids to keep your urine clear or pale yellow.  Only take over-the-counter or prescription medicines as directed by your caregiver.  Avoid drinking alcohol if it caused your condition.  Do not smoke.  Get plenty of rest.  Check your blood sugar at home as directed by your caregiver.  Keep all follow-up appointments as directed by your caregiver. SEEK MEDICAL CARE IF:   You do not recover as quickly as expected.  You develop new or worsening symptoms.  You have persistent pain, weakness, or nausea.  You recover and then have another episode of pain. SEEK IMMEDIATE MEDICAL CARE IF:   You are unable  to eat or keep fluids down.  Your pain becomes severe.  You have a fever or persistent symptoms for more than 2 to 3 days.  You have a fever and your symptoms suddenly get worse.  Your skin or the white part of your eyes turn yellow (jaundice).  You develop vomiting.  You feel dizzy, or you faint.  Your blood sugar is high (over 300 mg/dL). MAKE SURE YOU:   Understand these instructions.  Will watch your condition.  Will get help right away if you are not doing well or get worse.

## 2014-03-08 NOTE — Progress Notes (Signed)
Subjective: Doing much better today. Ready to eat regular meal. Tolerated clear liquid diet very well, without nausea, vomiting or abdominal pain. Still has some slight abdominal pain.  Objective: Vital signs in last 24 hours: Filed Vitals:   03/07/14 1405 03/07/14 2243 03/08/14 0616 03/08/14 1147  BP: 143/87 127/71 124/83 120/74  Pulse: 93 100 94 86  Temp: 98.4 F (36.9 C) 98 F (36.7 C) 97.9 F (36.6 C)   TempSrc: Oral Oral Oral   Resp: 18 18 18    Height:      Weight:      SpO2: 98% 96% 93%    Weight change:   Intake/Output Summary (Last 24 hours) at 03/08/14 1152 Last data filed at 03/08/14 0900  Gross per 24 hour  Intake   1320 ml  Output     17 ml  Net   1303 ml    Physical Exam-  General appearance: Lying in bed,  alert, cooperative, appears stated age and no distress Head: Normocephalic, without obvious abnormality, atraumatic,  Back: symmetric, no curvature. ROM normal. No CVA tenderness. Chest wall: no tenderness Lungs: No added sounds. Abdomen: Bowel sounds present, no tenderness, guarding or rebound. Heart: regular rate and rhythm, S1, S2 normal, no murmur, click, rubs or gallop Extremities: extremities normal, atraumatic, no cyanosis or edema   Lab Results: Basic Metabolic Panel:  Recent Labs Lab 03/05/14 2210  03/07/14 0501 03/08/14 0418  NA  --   < > 135* 137  K  --   < > 4.0 3.7  CL  --   < > 94* 98  CO2  --   < > 24 26  GLUCOSE  --   < > 75 88  BUN  --   < > 5* 4*  CREATININE  --   < > 0.57 0.61  CALCIUM  --   < > 9.2 9.5  MG 2.0  --   --  2.1  < > = values in this interval not displayed. Liver Function Tests:  Recent Labs Lab 03/05/14 2110 03/06/14 0410  AST 165* 93*  ALT 71* 52  ALKPHOS 95 74  BILITOT 1.6* 1.2  PROT 8.6* 7.1  ALBUMIN 4.4 3.6    Recent Labs Lab 03/05/14 0018 03/05/14 2110  LIPASE 223* 810*   CBC:  Recent Labs Lab 03/05/14 0018 03/05/14 2210 03/06/14 0410  WBC 6.3 7.5 6.1  NEUTROABS 4.2  --   --    HGB 13.2 12.4* 12.1*  HCT 39.2 37.3* 35.8*  MCV 75.7* 75.2* 75.7*  PLT 154 137* 142*   Fasting Lipid Panel:  Recent Labs Lab 03/05/14 2110  CHOL 220*  HDL 145  LDLCALC 61  TRIG 71  CHOLHDL 1.5   Coagulation:  Recent Labs Lab 03/05/14 2210  LABPROT 13.1  INR 0.99   Urine Drug Screen: Drugs of Abuse     Component Value Date/Time   LABOPIA POSITIVE* 12/29/2013 1050   COCAINSCRNUR NONE DETECTED 12/29/2013 1050   LABBENZ NONE DETECTED 12/29/2013 1050   AMPHETMU NONE DETECTED 12/29/2013 1050   THCU NONE DETECTED 12/29/2013 1050   LABBARB NONE DETECTED 12/29/2013 1050    Alcohol Level:  Recent Labs Lab 03/05/14 0058 03/05/14 2330  ETH 91* <11   Urinalysis:  Recent Labs Lab 03/05/14 0113 03/05/14 2047  COLORURINE ORANGE* AMBER*  LABSPEC 1.030 1.019  PHURINE 7.0 8.0  GLUCOSEU NEGATIVE NEGATIVE  HGBUR NEGATIVE NEGATIVE  BILIRUBINUR SMALL* SMALL*  KETONESUR 15* 40*  PROTEINUR NEGATIVE NEGATIVE  UROBILINOGEN  2.0* 1.0  NITRITE NEGATIVE NEGATIVE  LEUKOCYTESUR TRACE* NEGATIVE   Medications: I have reviewed the patient's current medications. Scheduled Meds: . folic acid  1 mg Oral Daily  . heparin  5,000 Units Subcutaneous 3 times per day  . multivitamin with minerals  1 tablet Oral Daily  . thiamine  100 mg Oral Daily   Or  . thiamine  100 mg Intravenous Daily   Continuous Infusions:   PRN Meds:.albuterol, HYDROcodone-acetaminophen, LORazepam **OR** LORazepam, nicotine, promethazine   Assessment/Plan: Pancreatitis: Resolving, Abdominal pain with nausea and vomiting, likely precipatated by alcohol abuse. Lipase 810, consistent with previous elevations and admissions.  - D/c Dilaudid 1 mg every 4 hours PRN, start- Norco- 5-325 1-2 tablets Q4H PRN -  D/c IVF. - Advance diet as tolerated. - D/c today, if tolerates diet, schedule follow up appointment with PCP. - Consult to social work to give pt resources to help with alcohol cessation, patient  receptive.  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 Denies any history of alcohol withdrawal in the past. Lactic acid and salicylates normal. - Ag closed today- 13 with glucose infusion, likely due to alcoholic ketoacidosis - Bmet tomorrow.  Hypertension: normotensive upon evaluation at 124/80. - Hold home hydrochlorothiazide 25 mg daily for now, BP stable  Asthma: no current complaints but No wheezing on exam. - Albuterol Neb  Tobacco abuse:  - nicotine patch per patient request  Diet: Clear diet.  Prophylaxis: heparin SQ  Code: Full  Dispo: Disposition is deferred at this time, awaiting improvement of current medical problems.  Anticipated discharge in approximately 1-2 day(s).   The patient does have a current PCP (Riley AlexandersAlexa Richardson, MD) and [facility].  The patient does not know have transportation limitations that hinder transportation to clinic appointments.  .Services Needed at time of discharge: Y = Yes, Blank = No PT:   OT:   RN:   Equipment:   Other:     LOS: 3 days   Riley Kim Riley Volante, MD 03/08/2014, 11:52 AM

## 2014-03-08 NOTE — Plan of Care (Signed)
Problem: Phase II Progression Outcomes Goal: Progress activity as tolerated unless otherwise ordered Outcome: Completed/Met Date Met:  03/08/14 Goal: Tolerates PO clear liquids Outcome: Completed/Met Date Met:  03/08/14 Goal: Nausea/vomiting controlled with medication Outcome: Completed/Met Date Met:  03/08/14 Goal: Blood sugar < 150 Outcome: Not Applicable Date Met:  16/24/46

## 2014-03-08 NOTE — Clinical Social Work Note (Signed)
CSW consulted regarding substance abuse treatment resources for pt at time of discharge. CSW met with pt at bedside to provide resources. Pt informed CSW pt would like to received substance abuse treatment while still maintaining pt's current job. CSW and pt reviewed resources. Pt stated pt has no other needs at this time. CSW updated RN regarding information above. CSW signing off. Thank you for the referral.  Henderson [place] [phone]) Licensed Clinical Social Worker Orthopedics [phone]) and Surgical [phone])

## 2014-03-08 NOTE — Plan of Care (Signed)
Problem: Phase I Progression Outcomes Goal: Nausea/vomiting controlled after medication Outcome: Completed/Met Date Met:  03/08/14

## 2014-03-15 ENCOUNTER — Encounter: Payer: Self-pay | Admitting: Internal Medicine

## 2014-03-15 ENCOUNTER — Ambulatory Visit (INDEPENDENT_AMBULATORY_CARE_PROVIDER_SITE_OTHER): Payer: Self-pay | Admitting: Internal Medicine

## 2014-03-15 VITALS — BP 109/78 | HR 90 | Temp 97.9°F | Ht 74.0 in | Wt 215.0 lb

## 2014-03-15 DIAGNOSIS — F17210 Nicotine dependence, cigarettes, uncomplicated: Secondary | ICD-10-CM

## 2014-03-15 DIAGNOSIS — K852 Alcohol induced acute pancreatitis without necrosis or infection: Secondary | ICD-10-CM

## 2014-03-15 DIAGNOSIS — I10 Essential (primary) hypertension: Secondary | ICD-10-CM

## 2014-03-15 DIAGNOSIS — F1010 Alcohol abuse, uncomplicated: Secondary | ICD-10-CM

## 2014-03-15 MED ORDER — BUPROPION HCL 100 MG PO TABS: 100.0000 mg | ORAL_TABLET | Freq: Three times a day (TID) | ORAL | Status: DC

## 2014-03-15 NOTE — ED Provider Notes (Signed)
CSN: [id number]     Arrival date & time 03/04/14  2339 History   First MD Initiated Contact with Patient 03/05/14 0017     Chief Complaint  Patient presents with  . Abdominal Pain     (Consider location/radiation/quality/duration/timing/severity/associated sxs/prior Treatment) HPI   38 year old male with abdominal pain.patient has a past history of alcohol abuse and pancreatitis. He states the current symptoms feel similar to previous episodes of pancreatitis. Pain is in epigastrium. Radiates to his back. Constant. No appreciable exacerbating relieving factors. Nonbilious/nonbloody emesis. No urinary complaints. No diarrhea. Patient continues to drink. No fevers or chills.additionally, he is requesting refill for his hydrochlorothiazide.  Past Medical History  Diagnosis Date  . Asthma   . Vertigo   . 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 Arthritis     "right foot, I broke it before"   Past Surgical History  Procedure Laterality Date  . Replantation thumb Right 2000   No family history on file. History  Substance Use Topics  . Smoking status: Current Every Day Smoker -- 1.00 packs/day for 20 years    Types: Cigarettes  . Smokeless tobacco: Never Used     Comment: cutting back  . Alcohol Use: 4.8 oz/week    7 Cans of beer, 1 Shots of liquor per week    Review of Systems  All systems reviewed and negative, other than as noted in HPI.   Allergies  Penicillins and Lisinopril  Home Medications   Prior to Admission medications   Medication Sig Start Date End Date Taking? Authorizing Provider  albuterol (PROVENTIL HFA;VENTOLIN HFA) 108 (90 BASE) MCG/ACT inhaler Inhale 2 puffs into the lungs every 6 (six) hours as needed for wheezing or shortness of breath. 01/05/14   Ky BarbanSolianny D Kennerly, MD  folic acid (FOLVITE) 1 MG tablet Take 1 tablet (1 mg total) by mouth daily. 03/08/14   Ejiroghene Wendall StadeE Emokpae, MD  hydrochlorothiazide  (HYDRODIURIL) 25 MG tablet Take 1 tablet (25 mg total) by mouth daily. 03/05/14   Raeford RazorStephen Leanda Padmore, MD  HYDROcodone-acetaminophen (NORCO/VICODIN) 5-325 MG per tablet Take 1-2 tablets by mouth every 6 (six) hours as needed for moderate pain. 03/08/14   Ejiroghene Wendall StadeE Emokpae, MD  ondansetron (ZOFRAN) 4 MG tablet Take 1 tablet (4 mg total) by mouth every 6 (six) hours. 03/05/14   Raeford RazorStephen Camari Wisham, MD  oxyCODONE-acetaminophen (PERCOCET/ROXICET) 5-325 MG per tablet Take 1 tablet by mouth every 4 (four) hours as needed for severe pain. 03/05/14   Raeford RazorStephen Gali Spinney, MD  thiamine 100 MG tablet Take 1 tablet (100 mg total) by mouth daily. 03/08/14   Ejiroghene E Emokpae, MD   BP 121/75 mmHg  Pulse 66  Temp(Src) 97.6 F (36.4 C) (Oral)  Resp 16  Wt 221 lb (100.245 kg)  SpO2 95% Physical Exam  Constitutional: He appears well-developed and well-nourished. No distress.  HENT:  Head: Normocephalic and atraumatic.  Eyes: Conjunctivae are normal. Right eye exhibits no discharge. Left eye exhibits no discharge.  Neck: Neck supple.  Cardiovascular: Normal rate, regular rhythm and normal heart sounds.  Exam reveals no gallop and no friction rub.   No murmur heard. Pulmonary/Chest: Effort normal and breath sounds normal. No respiratory distress.  Abdominal: Soft. He exhibits no distension. There is tenderness. There is no rebound and no guarding.  Epigastric tenderness without rebound or guarding. No distention.  Musculoskeletal: He exhibits no edema or tenderness.  Neurological: He is alert.  Skin: Skin is warm and  dry.  Psychiatric: He has a normal mood and affect. His behavior is normal. Thought content normal.  Nursing note and vitals reviewed.   ED Course  Procedures (including critical care time) Labs Review Labs Reviewed  CBC WITH DIFFERENTIAL - Abnormal; Notable for the following:    MCV 75.7 (*)    MCH 25.5 (*)    All other components within normal limits  COMPREHENSIVE METABOLIC PANEL - Abnormal;  Notable for the following:    Sodium 135 (*)    Potassium 3.5 (*)    Chloride 94 (*)    AST 296 (*)    ALT 80 (*)    Total Bilirubin 1.5 (*)    Anion gap 17 (*)    All other components within normal limits  LIPASE, BLOOD - Abnormal; Notable for the following:    Lipase 223 (*)    All other components within normal limits  ETHANOL - Abnormal; Notable for the following:    Alcohol, Ethyl (B) 91 (*)    All other components within normal limits  URINALYSIS, ROUTINE W REFLEX MICROSCOPIC - Abnormal; Notable for the following:    Color, Urine ORANGE (*)    Bilirubin Urine SMALL (*)    Ketones, ur 15 (*)    Urobilinogen, UA 2.0 (*)    Leukocytes, UA TRACE (*)    All other components within normal limits  URINE MICROSCOPIC-ADD ON - Abnormal; Notable for the following:    Squamous Epithelial / LPF FEW (*)    All other components within normal limits    Imaging Review No results found.   EKG Interpretation None      MDM   Final diagnoses:  Epigastric pain  Alcohol-induced acute pancreatitis    38 year old male with abdominal pain and nausea/vomiting. Likely pancreatitis. He has a history of alcohol-induced pancreatitis. His lipase is elevated. Additionally, he has elevated LFTs. He was treated with IV fluids, opiates and anti-medics. Symptoms currently improved. He is hemodynamically stable. No peritonitis on exam. Discussed continue symptomatic treatment and slow progression of his diet from clears. Return precautions were discussed.    Raeford RazorStephen Avant Printy, MD 03/15/14 [phone]

## 2014-03-15 NOTE — Assessment & Plan Note (Signed)
BP Readings from Last 3 Encounters:  03/15/14 109/78  03/08/14 120/74  03/05/14 121/75    Lab Results  Component Value Date   NA 137 03/08/2014   K 3.7 03/08/2014   CREATININE 0.61 03/08/2014    Assessment: Blood pressure control:  Well controlled Progress toward BP goal:   At goal  Plan: Medications:  continue current medications, HCTZ 25 mg daily Educational resources provided:  None Self management tools provided:  None

## 2014-03-15 NOTE — Assessment & Plan Note (Signed)
Assessment

## 2014-03-15 NOTE — Patient Instructions (Addendum)
General Instructions:   Thank you for bringing your medicines today. This helps us keep you safe from mistakes.  Please visit AA to see if these meeting are helpful for you. Also consider talking to your wife and/or someone who can support your decision to quit smoking and drinking. We will meet again in 1 month to discuss naltrexone for alcohol abuse. I have prescribed you bupropion for tobacco cessation.  Progress Toward Treatment Goals:  Treatment Goal 01/05/2014  Blood pressure at goal  Stop smoking smoking less    Self Care Goals & Plans:  Self Care Goal 01/05/2014  Manage my medications take my medicines as prescribed; bring my medications to every visit; refill my medications on time  Eat healthy foods drink diet soda or water instead of juice or soda; eat more vegetables; eat foods that are low in salt; eat baked foods instead of fried foods  Stop smoking call [facility] (1-800-QUIT-NOW)    No flowsheet data found.   Care Management & Community Referrals:  Referral 01/05/2014  Referrals made for care management support social worker    Alcohol Use Disorder Alcohol use disorder is a mental disorder. It is not a one-time incident of heavy drinking. Alcohol use disorder is the excessive and uncontrollable use of alcohol over time that leads to problems with functioning in one or more areas of daily living. People with this disorder risk harming themselves and others when they drink to excess. Alcohol use disorder also can cause other mental disorders, such as mood and anxiety disorders, and serious physical problems. People with alcohol use disorder often misuse other drugs.  Alcohol use disorder is common and widespread. Some people with this disorder drink alcohol to cope with or escape from negative life events. Others drink to relieve chronic pain or symptoms of mental illness. People with a family history of alcohol use disorder are at higher risk of losing control and using  alcohol to excess.  SYMPTOMS  Signs and symptoms of alcohol use disorder may include the following:   Consumption ofalcohol inlarger amounts or over a longer period of time than intended.  Multiple unsuccessful attempts to cutdown or control alcohol use.   A great deal of time spent obtaining alcohol, using alcohol, or recovering from the effects of alcohol (hangover).  A strong desire or urge to use alcohol (cravings).   Continued use of alcohol despite problems at work, school, or home because of alcohol use.   Continued use of alcohol despite problems in relationships because of alcohol use.  Continued use of alcohol in situations when it is physically hazardous, such as driving a car.  Continued use of alcohol despite awareness of a physical or psychological problem that is likely related to alcohol use. Physical problems related to alcohol use can involve the brain, heart, liver, stomach, and intestines. Psychological problems related to alcohol use include intoxication, depression, anxiety, psychosis, delirium, and dementia.   The need for increased amounts of alcohol to achieve the same desired effect, or a decreased effect from the consumption of the same amount of alcohol (tolerance).  Withdrawal symptoms upon reducing or stopping alcohol use, or alcohol use to reduce or avoid withdrawal symptoms. Withdrawal symptoms include:  Racing heart.  Hand tremor.  Difficulty sleeping.  Nausea.  Vomiting.  Hallucinations.  Restlessness.  Seizures. DIAGNOSIS Alcohol use disorder is diagnosed through an assessment by your health care provider. Your health care provider may start by asking three or four questions to screen for excessive or  problematic alcohol use. To confirm a diagnosis of alcohol use disorder, at least two symptoms must be present within a 5-month period. The severity of alcohol use disorder depends on the number of symptoms:  Mild--two or  three.  Moderate--four or five.  Severe--six or more. Your health care provider may perform a physical exam or use results from lab tests to see if you have physical problems resulting from alcohol use. Your health care provider may refer you to a mental health professional for evaluation. TREATMENT  Some people with alcohol use disorder are able to reduce their alcohol use to low-risk levels. Some people with alcohol use disorder need to quit drinking alcohol. When necessary, mental health professionals with specialized training in substance use treatment can help. Your health care provider can help you decide how severe your alcohol use disorder is and what type of treatment you need. The following forms of treatment are available:   Detoxification. Detoxification involves the use of prescription medicines to prevent alcohol withdrawal symptoms in the first week after quitting. This is important for people with a history of symptoms of withdrawal and for heavy drinkers who are likely to have withdrawal symptoms. Alcohol withdrawal can be dangerous and, in severe cases, cause death. Detoxification is usually provided in a hospital or in-patient substance use treatment facility.  Counseling or talk therapy. Talk therapy is provided by substance use treatment counselors. It addresses the reasons people use alcohol and ways to keep them from drinking again. The goals of talk therapy are to help people with alcohol use disorder find healthy activities and ways to cope with life stress, to identify and avoid triggers for alcohol use, and to handle cravings, which can cause relapse.  Medicines.Different medicines can help treat alcohol use disorder through the following actions:  Decrease alcohol cravings.  Decrease the positive reward response felt from alcohol use.  Produce an uncomfortable physical reaction when alcohol is used (aversion therapy).  Support groups. Support groups are run by people  who have quit drinking. They provide emotional support, advice, and guidance. These forms of treatment are often combined. Some people with alcohol use disorder benefit from intensive combination treatment provided by specialized substance use treatment centers. Both inpatient and outpatient treatment programs are available. Document Released: 05/29/2004 Document Revised: 09/05/2013 Document Reviewed: 07/29/2012 [facility] Patient Information 2015 [place], [place]. This information is not intended to replace advice given to you by your health care provider. Make sure you discuss any questions you have with your health care provider.    Finding Treatment for Alcohol and Drug Addiction It can be hard to find the right place to get professional treatment. Here are some important things to consider:  There are different types of treatment to choose from.  Some programs are live-in (residential) while others are not (outpatient). Sometimes a combination is offered.  No single type of program is right for everyone.  Most treatment programs involve a combination of education, counseling, and a 12-step, spiritually-based approach.  There are non-spiritually based programs (not 12-step).  Some treatment programs are government sponsored. They are geared for patients without private insurance.  Treatment programs can vary in many respects such as:  Cost and types of insurance accepted.  Types of on-site medical services offered.  Length of stay, setting, and size.  Overall philosophy of treatment. A person may need specialized treatment or have needs not addressed by all programs. For example, adolescents need treatment appropriate for their age. Other people have secondary disorders  that must be managed as well. Secondary conditions can include mental illness, such as depression or diabetes. Often, a period of detoxification from alcohol or drugs is needed. This requires medical supervision and  not all programs offer this. THINGS TO CONSIDER WHEN SELECTING A TREATMENT PROGRAM   Is the program certified by the appropriate government agency? Even private programs must be certified and employ certified professionals.  Does the program accept your insurance? If not, can a payment plan be set up?  Is the facility clean, organized, and well run? Do they allow you to speak with graduates who can share their treatment experience with you? Can you tour the facility? Can you meet with staff?  Does the program meet the full range of individual needs?  Does the treatment program address sexual orientation and physical disabilities? Do they provide age, gender, and culturally appropriate treatment services?  Is treatment available in languages other than English?  Is long-term aftercare support or guidance encouraged and provided?  Is assessment of an individual's treatment plan ongoing to ensure it meets changing needs?  Does the program use strategies to encourage reluctant patients to remain in treatment long enough to increase the likelihood of success?  Does the program offer counseling (individual or group) and other behavioral therapies?  Does the program offer medicine as part of the treatment regimen, if needed?  Is there ongoing monitoring of possible relapse? Is there a defined relapse prevention program? Are services or referrals offered to family members to ensure they understand addiction and the recovery process? This would help them support the recovering individual.  Are 12-step meetings held at the center or is transport available for patients to attend outside meetings? In countries outside of the [place], Magazine features editorsee local directories for contact information for services in your area. Document Released: 03/20/2005 Document Revised: 07/14/2011 Document Reviewed: 09/30/2007 [facility] Patient Information 2015 [place]. This information is not intended to replace  advice given to you by your health care provider. Make sure you discuss any questions you have with your health care provider.

## 2014-03-15 NOTE — Assessment & Plan Note (Addendum)
Assessment: Resolved. Patient denies nausea, vomiting, abdominal pain. He has not been taking his zofran or pain medications because he has not needed them. Unfortunately, patient continues to drink about a pint to 1/5 of liquor a day.  Plan: We discussed cutting back on alcohol until he can quit completely. Patient will research AA meetings to see if there is a time slot that will work for his schedule. Patient will discuss with his wife and try to gain support from her and his mom. Patient seems very motivated to quit as he has already cut back significantly from what he was previously drinking months ago. Information was also given to the patient about Naltrexone. Unfortunately, this medication is very expensive without insurance, however patient may be getting insurance soon. We will discuss the option of this treatment at the next visit.

## 2014-03-15 NOTE — Progress Notes (Signed)
Subjective:     Patient ID: Riley Kim, male   DOB: [birth year], 38 y.o.   MRN: [id number]  HPI  Mr. [name] is a 38 yo male with PMHx of alcoholic pancreatitis, HTN, Asthma, tobacco and alcohol abuse who presents to the clinic for hospital follow up. Patient was admitted at the beginning of November 2015 for alcoholic pancreatitis. Patient's pain and nausea resolved and he was tolerating a regular diet at the time of discharge. Today, patient states his abdominal pain and nausea have resolved. He did not take all of his pain medications because he was not requiring them. He denies any nausea. Unfortunately, patient continues to drink alcohol. He admits to 4/4 CAGE questions and he wants to quit drinking and smoking. He requests chantix or wellbutrin, whatever is cheapest. He has not been to AA because of his work schedule. His motivating factors to quit are his mom, and 3 sons. His wife causes him stress and he doubts that she will be very supportive.    Review of Systems  General: Denies fever, chills, fatigue, change in appetite and diaphoresis.  Respiratory: Denies SOB, cough, DOE, chest tightness, and wheezing.   Cardiovascular: Denies chest pain and palpitations.  Gastrointestinal: Denies nausea, vomiting, abdominal pain, diarrhea, constipation, blood in stool and abdominal distention.  Genitourinary: Denies dysuria, urgency, frequency, hematuria, suprapubic pain and flank pain. Endocrine: Denies hot or cold intolerance, polyuria, and polydipsia. Musculoskeletal: Denies myalgias, back pain, joint swelling, arthralgias and gait problem.  Skin: Denies pallor, rash and wounds.  Neurological: Denies dizziness, headaches, weakness, lightheadedness, numbness,seizures, and syncope, Psychiatric/Behavioral: Denies mood changes, confusion, nervousness, sleep disturbance and agitation.     Objective:   Physical Exam  Filed Vitals:   03/15/14 1419  BP: 109/78  Pulse: 90  Temp: 97.9 F (36.6  C)  TempSrc: Oral  Height: 6\' 2"  (1.88 m)  Weight: 215 lb (97.523 kg)  SpO2: 97%   General: Vital signs reviewed.  Patient is tearful during the interview, but well-developed and well-nourished, and cooperative with exam.  Cardiovascular: RRR, S1 normal, S2 normal, no murmurs, gallops, or rubs. Pulmonary/Chest: Clear to auscultation bilaterally, no wheezes, rales, or rhonchi. Abdominal: Soft, non-tender, non-distended, BS +, no masses, organomegaly, or guarding present.  Musculoskeletal: No joint deformities, erythema, or stiffness, ROM full and nontender. Extremities: No lower extremity edema bilaterally,  pulses symmetric and intact bilaterally. No cyanosis or clubbing. Skin: Warm, dry and intact. No rashes or erythema. Psychiatric: Tearful, distressed about alcohol use.      Assessment:         Plan:     Please see problem based assessment and plan.

## 2014-03-15 NOTE — Assessment & Plan Note (Signed)
  Assessment: Progress toward smoking cessation:   No progress Barriers to progress toward smoking cessation:    Stress Comments: Patient is motivated to quit and would like to try wellbutrin again which helped in the past.   Plan: Instruction/counseling given:  I counseled patient on the dangers of tobacco use, advised patient to stop smoking, and reviewed strategies to maximize success. Educational resources provided:    Self management tools provided:    Medications to assist with smoking cessation:  Bupropion (Zyban) Patient agreed to the following self-care plans for smoking cessation:    Other plans: Patient may or may not pick up this medication due to price. Started him on bupropion 100 mg TID for one month and will increase at next visit.

## 2014-03-16 NOTE — Progress Notes (Signed)
Internal Medicine Clinic Attending  I saw and evaluated the patient.  I personally confirmed the key portions of the history and exam documented by Dr. Senaida Oresichardson and I reviewed pertinent patient test results.  The assessment, diagnosis, and plan were formulated together and I agree with the documentation in the resident's note. He is attempting to get medicaid which might open up options for ETOH tx options.

## 2014-04-11 ENCOUNTER — Encounter (HOSPITAL_COMMUNITY): Payer: Self-pay | Admitting: *Deleted

## 2014-04-11 ENCOUNTER — Inpatient Hospital Stay (HOSPITAL_COMMUNITY)
Admission: EM | Admit: 2014-04-11 | Discharge: 2014-04-13 | DRG: 439 | Disposition: A | Discharge status: Home / Self Care | Payer: Medicaid Other | Attending: Internal Medicine | Admitting: Internal Medicine

## 2014-04-11 DIAGNOSIS — Z888 Allergy status to other drugs, medicaments and biological substances status: Secondary | ICD-10-CM | POA: Diagnosis not present

## 2014-04-11 DIAGNOSIS — J45909 Unspecified asthma, uncomplicated: Secondary | ICD-10-CM | POA: Diagnosis present

## 2014-04-11 DIAGNOSIS — F1010 Alcohol abuse, uncomplicated: Secondary | ICD-10-CM | POA: Diagnosis present

## 2014-04-11 DIAGNOSIS — E8729 Other acidosis: Secondary | ICD-10-CM | POA: Diagnosis present

## 2014-04-11 DIAGNOSIS — Z880 Allergy status to penicillin: Secondary | ICD-10-CM

## 2014-04-11 DIAGNOSIS — Z79899 Other long term (current) drug therapy: Secondary | ICD-10-CM | POA: Diagnosis not present

## 2014-04-11 DIAGNOSIS — K852 Alcohol induced acute pancreatitis without necrosis or infection: Secondary | ICD-10-CM | POA: Diagnosis present

## 2014-04-11 DIAGNOSIS — F17210 Nicotine dependence, cigarettes, uncomplicated: Secondary | ICD-10-CM | POA: Diagnosis present

## 2014-04-11 DIAGNOSIS — K859 Acute pancreatitis without necrosis or infection, unspecified: Secondary | ICD-10-CM | POA: Insufficient documentation

## 2014-04-11 DIAGNOSIS — I10 Essential (primary) hypertension: Secondary | ICD-10-CM | POA: Diagnosis present

## 2014-04-11 DIAGNOSIS — R630 Anorexia: Secondary | ICD-10-CM | POA: Diagnosis present

## 2014-04-11 DIAGNOSIS — E8889 Other specified metabolic disorders: Secondary | ICD-10-CM | POA: Diagnosis present

## 2014-04-11 DIAGNOSIS — D509 Iron deficiency anemia, unspecified: Secondary | ICD-10-CM | POA: Diagnosis present

## 2014-04-11 DIAGNOSIS — E872 Acidosis: Secondary | ICD-10-CM | POA: Diagnosis present

## 2014-04-11 DIAGNOSIS — K858 Other acute pancreatitis without necrosis or infection: Secondary | ICD-10-CM

## 2014-04-11 DIAGNOSIS — M13871 Other specified arthritis, right ankle and foot: Secondary | ICD-10-CM | POA: Diagnosis present

## 2014-04-11 LAB — CBG MONITORING, ED: Glucose-Capillary: 93 mg/dL (ref 70–99)

## 2014-04-11 LAB — CBC WITH DIFFERENTIAL/PLATELET
BASOS ABS: 0.1 10*3/uL (ref 0.0–0.1)
Basophils Relative: 1 % (ref 0–1)
EOS ABS: 0.1 10*3/uL (ref 0.0–0.7)
EOS PCT: 1 % (ref 0–5)
HCT: 38.3 % — ABNORMAL LOW (ref 39.0–52.0)
Hemoglobin: 12.6 g/dL — ABNORMAL LOW (ref 13.0–17.0)
Lymphocytes Relative: 17 % (ref 12–46)
Lymphs Abs: 1.4 10*3/uL (ref 0.7–4.0)
MCH: 24.7 pg — AB (ref 26.0–34.0)
MCHC: 32.9 g/dL (ref 30.0–36.0)
MCV: 75.1 fL — AB (ref 78.0–100.0)
MONO ABS: 0.8 10*3/uL (ref 0.1–1.0)
Monocytes Relative: 9 % (ref 3–12)
Neutro Abs: 5.8 10*3/uL (ref 1.7–7.7)
Neutrophils Relative %: 72 % (ref 43–77)
PLATELETS: 144 10*3/uL — AB (ref 150–400)
RBC: 5.1 MIL/uL (ref 4.22–5.81)
RDW: 15 % (ref 11.5–15.5)
WBC: 8.1 10*3/uL (ref 4.0–10.5)

## 2014-04-11 LAB — BASIC METABOLIC PANEL
ANION GAP: 23 — AB (ref 5–15)
Anion gap: 21 — ABNORMAL HIGH (ref 5–15)
BUN: 16 mg/dL (ref 6–23)
BUN: 14 mg/dL (ref 6–23)
CALCIUM: 10.7 mg/dL — AB (ref 8.4–10.5)
CHLORIDE: 88 meq/L — AB (ref 96–112)
CO2: 23 mEq/L (ref 19–32)
CO2: 24 mEq/L (ref 19–32)
CREATININE: 0.93 mg/dL (ref 0.50–1.35)
Calcium: 9.9 mg/dL (ref 8.4–10.5)
Chloride: 88 mEq/L — ABNORMAL LOW (ref 96–112)
Creatinine, Ser: 0.67 mg/dL (ref 0.50–1.35)
GFR calc Af Amer: >90 mL/min (ref >90)
GFR calc Af Amer: >90 mL/min (ref >90)
GFR calc non Af Amer: >90 mL/min (ref >90)
GFR calc non Af Amer: >90 mL/min (ref >90)
GLUCOSE: 79 mg/dL (ref 70–99)
Glucose, Bld: 85 mg/dL (ref 70–99)
POTASSIUM: 3.8 meq/L (ref 3.7–5.3)
Potassium: 3.7 mEq/L (ref 3.7–5.3)
Sodium: 134 mEq/L — ABNORMAL LOW (ref 137–147)
Sodium: 133 mEq/L — ABNORMAL LOW (ref 137–147)

## 2014-04-11 LAB — URINALYSIS, ROUTINE W REFLEX MICROSCOPIC
Glucose, UA: NEGATIVE mg/dL
HGB URINE DIPSTICK: NEGATIVE
KETONES UR: 40 mg/dL — AB
Leukocytes, UA: NEGATIVE
NITRITE: NEGATIVE
PH: 6 (ref 5.0–8.0)
Protein, ur: NEGATIVE mg/dL
Specific Gravity, Urine: 1.026 (ref 1.005–1.030)
Urobilinogen, UA: 1 mg/dL (ref 0.0–1.0)

## 2014-04-11 LAB — HEPATIC FUNCTION PANEL
ALK PHOS: 59 U/L (ref 39–117)
ALT: 52 U/L (ref 0–53)
AST: 113 U/L — ABNORMAL HIGH (ref 0–37)
Albumin: 4.5 g/dL (ref 3.5–5.2)
BILIRUBIN TOTAL: 1.4 mg/dL — AB (ref 0.3–1.2)
Bilirubin, Direct: 0.4 mg/dL — ABNORMAL HIGH (ref 0.0–0.3)
Indirect Bilirubin: 1 mg/dL — ABNORMAL HIGH (ref 0.3–0.9)
TOTAL PROTEIN: 8.9 g/dL — AB (ref 6.0–8.3)

## 2014-04-11 LAB — ETHANOL: Alcohol, Ethyl (B): <11 mg/dL (ref 0–11)

## 2014-04-11 LAB — LIPASE, BLOOD: Lipase: 249 U/L — ABNORMAL HIGH (ref 11–59)

## 2014-04-11 MED ORDER — MORPHINE SULFATE 2 MG/ML IJ SOLN
2.0000 mg | INTRAMUSCULAR | Status: DC | PRN
  Administered 2014-04-11 – 2014-04-13 (×3): 2 mg via INTRAVENOUS
  Filled 2014-04-11 (×3): qty 1

## 2014-04-11 MED ORDER — LORAZEPAM 1 MG PO TABS: 0.0000 mg | ORAL_TABLET | Freq: Two times a day (BID) | ORAL | Status: DC

## 2014-04-11 MED ORDER — VITAMIN B-1 100 MG PO TABS: 100.0000 mg | ORAL_TABLET | Freq: Every day | ORAL | Status: DC

## 2014-04-11 MED ORDER — LACTATED RINGERS IV BOLUS (SEPSIS): 2000.0000 mL | Freq: Once | INTRAVENOUS | Status: DC

## 2014-04-11 MED ORDER — ADULT MULTIVITAMIN W/MINERALS CH
1.0000 | ORAL_TABLET | Freq: Every day | ORAL | Status: DC
  Administered 2014-04-11 – 2014-04-13 (×3): 1 via ORAL
  Filled 2014-04-11 (×3): qty 1

## 2014-04-11 MED ORDER — HEPARIN SODIUM (PORCINE) 5000 UNIT/ML IJ SOLN
5000.0000 [IU] | Freq: Three times a day (TID) | INTRAMUSCULAR | Status: DC
  Filled 2014-04-11 (×6): qty 1

## 2014-04-11 MED ORDER — LORAZEPAM 1 MG PO TABS: 1.0000 mg | ORAL_TABLET | Freq: Four times a day (QID) | ORAL | Status: DC | PRN

## 2014-04-11 MED ORDER — HYDROMORPHONE HCL 1 MG/ML IJ SOLN
1.0000 mg | Freq: Once | INTRAMUSCULAR | Status: AC
  Administered 2014-04-11: 1 mg via INTRAVENOUS
  Filled 2014-04-11: qty 1

## 2014-04-11 MED ORDER — ONDANSETRON HCL 4 MG/2ML IJ SOLN
4.0000 mg | Freq: Four times a day (QID) | INTRAMUSCULAR | Status: DC | PRN
Start: 2014-04-11 — End: 2014-04-13

## 2014-04-11 MED ORDER — LORAZEPAM 1 MG PO TABS: 0.0000 mg | ORAL_TABLET | Freq: Four times a day (QID) | ORAL | Status: AC

## 2014-04-11 MED ORDER — SIMETHICONE 80 MG PO CHEW
80.0000 mg | CHEWABLE_TABLET | Freq: Once | ORAL | Status: AC
Start: 2014-04-11 — End: 2014-04-12
  Administered 2014-04-12: 80 mg via ORAL
  Filled 2014-04-11 (×2): qty 1

## 2014-04-11 MED ORDER — ALBUTEROL SULFATE (2.5 MG/3ML) 0.083% IN NEBU: 3.0000 mL | INHALATION_SOLUTION | RESPIRATORY_TRACT | Status: DC | PRN

## 2014-04-11 MED ORDER — NICOTINE 21 MG/24HR TD PT24
21.0000 mg | MEDICATED_PATCH | Freq: Every day | TRANSDERMAL | Status: DC
  Administered 2014-04-11 – 2014-04-13 (×3): 21 mg via TRANSDERMAL
  Filled 2014-04-11 (×3): qty 1

## 2014-04-11 MED ORDER — PANTOPRAZOLE SODIUM 40 MG IV SOLR
40.0000 mg | Freq: Once | INTRAVENOUS | Status: AC
  Administered 2014-04-11: 40 mg via INTRAVENOUS
  Filled 2014-04-11: qty 40

## 2014-04-11 MED ORDER — SUCRALFATE 1 G PO TABS
1.0000 g | ORAL_TABLET | Freq: Once | ORAL | Status: AC
Start: 2014-04-11 — End: 2014-04-11
  Administered 2014-04-11: 1 g via ORAL
  Filled 2014-04-11: qty 1

## 2014-04-11 MED ORDER — FOLIC ACID 1 MG PO TABS
1.0000 mg | ORAL_TABLET | Freq: Every day | ORAL | Status: DC
  Administered 2014-04-11 – 2014-04-13 (×3): 1 mg via ORAL
  Filled 2014-04-11 (×3): qty 1

## 2014-04-11 MED ORDER — FOLIC ACID 1 MG PO TABS
1.0000 mg | ORAL_TABLET | Freq: Every day | ORAL | Status: DC
Start: 2014-04-11 — End: 2014-04-11

## 2014-04-11 MED ORDER — OXYCODONE-ACETAMINOPHEN 5-325 MG PO TABS
1.0000 | ORAL_TABLET | ORAL | Status: DC | PRN
  Administered 2014-04-12 (×2): 1 via ORAL
  Filled 2014-04-11 (×2): qty 1

## 2014-04-11 MED ORDER — LORAZEPAM 2 MG/ML IJ SOLN: 1.0000 mg | Freq: Four times a day (QID) | INTRAMUSCULAR | Status: DC | PRN

## 2014-04-11 MED ORDER — THIAMINE HCL 100 MG/ML IJ SOLN
100.0000 mg | Freq: Every day | INTRAMUSCULAR | Status: DC
  Filled 2014-04-11 (×2): qty 1

## 2014-04-11 MED ORDER — ONDANSETRON HCL 4 MG/2ML IJ SOLN
4.0000 mg | Freq: Once | INTRAMUSCULAR | Status: AC
  Administered 2014-04-11: 4 mg via INTRAVENOUS
  Filled 2014-04-11: qty 2

## 2014-04-11 MED ORDER — POLYETHYLENE GLYCOL 3350 17 G PO PACK
17.0000 g | PACK | Freq: Every day | ORAL | Status: DC
  Administered 2014-04-12 – 2014-04-13 (×2): 17 g via ORAL
  Filled 2014-04-11 (×2): qty 1

## 2014-04-11 MED ORDER — LACTATED RINGERS IV SOLN
INTRAVENOUS | Status: DC
  Administered 2014-04-11: 05:00:00 via INTRAVENOUS

## 2014-04-11 MED ORDER — VITAMIN B-1 100 MG PO TABS
100.0000 mg | ORAL_TABLET | Freq: Every day | ORAL | Status: DC
  Administered 2014-04-11 – 2014-04-13 (×3): 100 mg via ORAL
  Filled 2014-04-11 (×3): qty 1

## 2014-04-11 MED ORDER — DEXTROSE IN LACTATED RINGERS 5 % IV SOLN
INTRAVENOUS | Status: DC
  Administered 2014-04-11 – 2014-04-13 (×4): via INTRAVENOUS

## 2014-04-11 MED ORDER — FENTANYL CITRATE 0.05 MG/ML IJ SOLN
100.0000 ug | Freq: Once | INTRAMUSCULAR | Status: AC
  Administered 2014-04-11: 100 ug via INTRAVENOUS
  Filled 2014-04-11: qty 2

## 2014-04-11 MED ORDER — MORPHINE SULFATE 2 MG/ML IJ SOLN
2.0000 mg | INTRAMUSCULAR | Status: DC | PRN
  Administered 2014-04-11 (×2): 2 mg via INTRAVENOUS
  Filled 2014-04-11 (×2): qty 1

## 2014-04-11 MED ORDER — BUPROPION HCL 100 MG PO TABS
100.0000 mg | ORAL_TABLET | Freq: Three times a day (TID) | ORAL | Status: DC
  Filled 2014-04-11 (×3): qty 1

## 2014-04-11 NOTE — H&P (Signed)
Date: 04/11/2014               Patient Name:  Riley Kim MRN: [id number]  DOB: [birth year] Age / Sex: 38 y.o., male   PCP: Jill Alexanders, MD         Medical Service: Internal Medicine Teaching Service         Attending Physician: Dr. Aletta Edouard, MD    First Contact: Dr. Harold Barban Pager: [phone]  Second Contact: Dr. Evelena Peat Pager: [phone]       After Hours (After 5p/  First Contact Pager: [phone]  weekends / holidays): Second Contact Pager: [phone]   Chief Complaint: Abdominal pain  History of Present Illness: Riley Kim is a 38yo man w/ PMHx of alcohol-induced pancreatitis (last admission 03/05/14), alcohol abuse, HTN, and asthma who presents to the ED with abdominal pain and decreased appetite for 3 days. Patient states he woke up Saturday morning and ate a breakfast burrito when he suddenly felt abdominal pain. He describes the pain as 7/10 in severity, sharp, epigastric, radiating to his back, and worse with PO intake. He reports he knew his pain was pancreatitis because he has had similar pain before. He states he has not been able to keep anything down since Saturday, except for small amounts of water. He denies fever and chills. He reports he last drank alcohol on Thursday (04/06/14). He states he normally drinks 2 pints per day, but has recently cut down to 1/2-1 pint per day. He states he knows that drinking alcohol has caused his pain.   In the ED he received a 2 L bolus of lactated ringers, Protonix 40 mg, and Dilaudid and Fentanyl for pain. Lipase was found to be 249 and alcohol level was negative.   Meds: Current Facility-Administered Medications  Medication Dose Route Frequency Provider Last Rate Last Dose  . lactated ringers bolus 2,000 mL  2,000 mL Intravenous Once Dow Adolph, MD      . lactated ringers infusion   Intravenous Continuous Dow Adolph, MD      . morphine 2 MG/ML injection 2 mg  2 mg Intravenous Q2H PRN Dow Adolph, MD        . ondansetron [facility]) injection 4 mg  4 mg Intravenous Q6H PRN Dow Adolph, MD       Current Outpatient Prescriptions  Medication Sig Dispense Refill  . folic acid (FOLVITE) 1 MG tablet Take 1 tablet (1 mg total) by mouth daily. 30 tablet 1  . hydrochlorothiazide (HYDRODIURIL) 25 MG tablet Take 1 tablet (25 mg total) by mouth daily. 30 tablet 3  . HYDROcodone-acetaminophen (NORCO/VICODIN) 5-325 MG per tablet Take 1-2 tablets by mouth every 6 (six) hours as needed for moderate pain. 30 tablet 0  . Multiple Vitamin (MULTIVITAMIN WITH MINERALS) TABS tablet Take 1 tablet by mouth daily.    Marland Kitchen oxyCODONE-acetaminophen (PERCOCET/ROXICET) 5-325 MG per tablet Take 1 tablet by mouth every 4 (four) hours as needed for severe pain. 10 tablet 0  . thiamine 100 MG tablet Take 1 tablet (100 mg total) by mouth daily. 30 tablet 1  . albuterol (PROVENTIL HFA;VENTOLIN HFA) 108 (90 BASE) MCG/ACT inhaler Inhale 2 puffs into the lungs every 6 (six) hours as needed for wheezing or shortness of breath. 1 Inhaler 2  . buPROPion (WELLBUTRIN) 100 MG tablet Take 1 tablet (100 mg total) by mouth 3 (three) times daily. 90 tablet 0  . ondansetron (ZOFRAN) 4 MG tablet Take 1 tablet (4 mg total) by mouth every  6 (six) hours. (Patient not taking: Reported on 04/11/2014) 12 tablet 0  . [DISCONTINUED] lisinopril (PRINIVIL,ZESTRIL) 10 MG tablet Take 1 tablet (10 mg total) by mouth daily. 30 tablet 3    Allergies: Allergies as of 04/11/2014 - Review Complete 04/11/2014  Allergen Reaction Noted  . Penicillins Other (See Comments) 09/21/2012  . Lisinopril Swelling 01/18/2014   Past Medical History  Diagnosis Date  . Asthma   . Vertigo   . 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 Arthritis     "right foot, I broke it before"   Past Surgical History  Procedure Laterality Date  . Replantation thumb Right 2000   No family history on file. History   Social History  . Marital  Status: Married    Spouse Name: N/A    Number of Children: N/A  . Years of Education: N/A   Occupational History  .  [employer]   Social History Main Topics  . Smoking status: Current Every Day Smoker -- 1.00 packs/day for 20 years    Types: Cigarettes  . Smokeless tobacco: Never Used     Comment: cutting back  . Alcohol Use: 4.8 oz/week    7 Cans of beer, 1 Shots of liquor per week  . Drug Use: No     Comment: "last marijuana was ~ 2012" (12/29/2013)  . Sexual Activity: Not on file   Other Topics Concern  . Not on file   Social History Narrative    Review of Systems: General: Denies night sweats, changes in weight HEENT: Denies headaches, ear pain, changes in vision, rhinorrhea, sore throat CV: Denies CP, palpitations, SOB, orthopnea Pulm: Denies SOB, cough, wheezing GI: Denies diarrhea, constipation, melena, hematochezia GU: Denies dysuria, hematuria, frequency Msk: Denies muscle cramps, joint pains Neuro: Denies weakness, numbness, tingling Skin: Denies rashes, bruising  Physical Exam: Blood pressure 110/71, pulse 94, temperature 97.9 F (36.6 C), temperature source Oral, resp. rate 19, height 6\' 2"  (1.88 m), weight 100.699 kg (222 lb), SpO2 97 %. General: alert, sitting up in bed, NAD HEENT: /AT, EOMI, sclera anicteric, mucus membranes dry CV: RRR, normal S1/S2, no m/g/r Pulm: mild wheezing bilaterally, breaths non-labored Abd: BS+, soft, tenderness to palpation in epigastrium Ext: warm, no edema, moves all Neuro: alert and oriented x 3, CNs II-XII intact, strength 5/5 in upper and lower extremities bilaterally  Lab results: Basic Metabolic Panel:  Recent Labs  40/98/1110/12/18 0131  NA 134*  K 3.7  CL 88*  CO2 23  GLUCOSE 85  BUN 16  CREATININE 0.93  CALCIUM 10.7*   Liver Function Tests:  Recent Labs  04/11/14 0131  AST 113*  ALT 52  ALKPHOS 59  BILITOT 1.4*  PROT 8.9*  ALBUMIN 4.5    Recent Labs  04/11/14 0131  LIPASE 249*   No results  for input(s): AMMONIA in the last 72 hours. CBC:  Recent Labs  04/11/14 0131  WBC 8.1  NEUTROABS 5.8  HGB 12.6*  HCT 38.3*  MCV 75.1*  PLT 144*   Cardiac Enzymes: No results for input(s): CKTOTAL, CKMB, CKMBINDEX, TROPONINI in the last 72 hours. BNP: No results for input(s): PROBNP in the last 72 hours. D-Dimer: No results for input(s): DDIMER in the last 72 hours. CBG:  Recent Labs  04/11/14 0111  GLUCAP 93   Hemoglobin A1C: No results for input(s): HGBA1C in the last 72 hours. Fasting Lipid Panel: No results for input(s): CHOL, HDL, LDLCALC, TRIG, CHOLHDL,  LDLDIRECT in the last 72 hours. Thyroid Function Tests: No results for input(s): TSH, T4TOTAL, FREET4, T3FREE, THYROIDAB in the last 72 hours. Anemia Panel: No results for input(s): VITAMINB12, FOLATE, FERRITIN, TIBC, IRON, RETICCTPCT in the last 72 hours. Coagulation: No results for input(s): LABPROT, INR in the last 72 hours. Urine Drug Screen: Drugs of Abuse     Component Value Date/Time   LABOPIA POSITIVE* 12/29/2013 1050   COCAINSCRNUR NONE DETECTED 12/29/2013 1050   LABBENZ NONE DETECTED 12/29/2013 1050   AMPHETMU NONE DETECTED 12/29/2013 1050   THCU NONE DETECTED 12/29/2013 1050   LABBARB NONE DETECTED 12/29/2013 1050    Alcohol Level:  Recent Labs  04/11/14 0131  ETH <11   Urinalysis: No results for input(s): COLORURINE, LABSPEC, PHURINE, GLUCOSEU, HGBUR, BILIRUBINUR, KETONESUR, PROTEINUR, UROBILINOGEN, NITRITE, LEUKOCYTESUR in the last 72 hours.  Invalid input(s): APPERANCEUR   Imaging results:  No results found.  Assessment & Plan by Problem: Principal Problem:   Acute alcoholic pancreatitis Active Problems:   Alcohol abuse   Essential hypertension   Increased anion gap metabolic acidosis Mr. [name] is a 38yo man w/ PMHx of alcohol-induced pancreatitis, alcohol abuse, HTN, and asthma who presents with acute alcoholic pancreatitis.  Acute Alcoholic Pancreatitis: Patient presents  with epigastric abdominal pain, decreased PO intake, and N/V for 3 days. Lipase 249. Pt with hx of alcohol abuse and previous episodes of pancreatitis secondary to alcohol. He is afebrile and other vital signs stable. Alcohol is negative, patient last drank ~5 days ago. Patient on HCTZ at home for HTN. May consider switching to different medication since HCTZ known to cause pancreatitis. Will treat with IVF and bowel rest.  - 2 L bolus, IVF @ 200 ml/hr - NPO - Morphine 2 mg Q2H PRN pain - Zofran PRN - Hold HCTZ  Anion Gap Metabolic Acidosis: AG 23, bicarb 23 on admission. Most likely due to combination of alcoholic ketoacidosis and starvation ketoacidosis. Will give copious fluids and recheck bmet. - Repeat bmet - 2 L bolus, IVF @ 200 ml/hr  Alcohol Abuse: Patient reports he normally drinks 2 pints of alcohol per day, but has recently cut down to 1/2-1 pint per day. Last drink on Thursday (04/06/14), approximately 5 days ago. Ethanol level negative. Denies alcohol withdrawal in the past. AST 113, ALT 52, about 2:1 ratio consistent with alcoholic hepatitis.  - CIWA protocol - Thiamine, folic acid, multivitamin  HTN: BP 107/62 on admission. He takes HCTZ 25 mg daily at home. May consider switching to different medication since HCTZ known to cause pancreatitis and patient has had multiple episodes.  - Hold HCTZ   Diet: NPO, IVF at 200 ml/hr DVT/PE PPx: Heparin SQ Dispo: Disposition is deferred at this time, awaiting improvement of current medical problems. Anticipated discharge in approximately 1-2 day(s).   The patient does have a current PCP (Jill AlexandersAlexa Richardson, MD) and [facility].  The patient does not have transportation limitations that hinder transportation to clinic appointments.  Signed: Rich Numberarly Rivet, MD 04/11/2014, 4:40 AM

## 2014-04-11 NOTE — ED Notes (Signed)
Dr. Otter at the bedside.  

## 2014-04-11 NOTE — Progress Notes (Signed)
CARE MANAGEMENT NOTE 04/11/2014  Patient:  Riley Kim,Riley Kim   Account Number:  [id number]  Date Initiated:  04/11/2014  Documentation initiated by:  [facility],Vanita Cannell  Subjective/Objective Assessment:   pancreatitis, ETOH     Action/Plan:   Anticipated DC Date:     Anticipated DC Plan:  HOME/SELF CARE      DC Planning Services  CM consult      Choice offered to / List presented to:             Status of service:  In process, will continue to follow Medicare Important Message given?   (If response is "NO", the following Medicare IM given date fields will be blank) Date Medicare IM given:   Medicare IM given by:   Date Additional Medicare IM given:   Additional Medicare IM given by:    Discharge Disposition:    Per UR Regulation:    If discussed at Long Length of Stay Meetings, dates discussed:    Comments:  04/11/2014 1200 NCM spoke to pt. Faxed copy of Medicaid card to ED registration. Isidoro DonningAlesia Daelyn Pettaway RN CCM Case Mgmt phone [phone]

## 2014-04-11 NOTE — ED Notes (Signed)
Patient vomiting in triage.

## 2014-04-11 NOTE — ED Provider Notes (Signed)
CSN: [id number]     Arrival date & time 04/11/14  0104 History  This chart was scribed for Olivia Mackie, MD by Bronson Curb, ED Scribe. This patient was seen in room A10C/A10C and the patient's care was started at 2:09 AM.   Chief Complaint  Patient presents with  . Pancreatitis     The history is provided by the patient. No language interpreter was used.     HPI Comments: Riley Kim is a 38 y.o. male who presents to the Emergency Department for possible pancreatitis flare-up that began 3 days ago. Patient suspects he ate something he "should not have eaten". There is associated right upper abdominal pain, diaphoresis, nausea, and vomiting. He also notes associated that back pain which is typical with his pancreatitis flare ups. Patient reports history of 2 flare ups in the last 4 months. He reports taking hydrocodone for the pain, states he only takes Zofran when he takes the hydrocodone. He reports eating exacerbate his symptoms, but states he is able to tolerate fluids. However, he states he drank some water while in the waiting room and vomited bile and mucus shortly after. He reports being seen by internal medicine, but has recently gotten approved for Medicaid and is not yet established with a PCP. He denies recent EtOH consumption. Patient is currently taking folic acid, hydrochlorothiazide, thiamine, and multivitamins.   Past Medical History  Diagnosis Date  . Asthma   . Vertigo   . 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 Arthritis     "right foot, I broke it before"   Past Surgical History  Procedure Laterality Date  . Replantation thumb Right 2000   No family history on file. History  Substance Use Topics  . Smoking status: Current Every Day Smoker -- 1.00 packs/day for 20 years    Types: Cigarettes  . Smokeless tobacco: Never Used     Comment: cutting back  . Alcohol Use: 4.8 oz/week    7 Cans of beer, 1 Shots of  liquor per week    Review of Systems  Constitutional: Positive for diaphoresis.  Gastrointestinal: Positive for nausea, vomiting and abdominal pain.      Allergies  Penicillins and Lisinopril  Home Medications   Prior to Admission medications   Medication Sig Start Date End Date Taking? Authorizing Provider  folic acid (FOLVITE) 1 MG tablet Take 1 tablet (1 mg total) by mouth daily. 03/08/14  Yes Ejiroghene E Emokpae, MD  hydrochlorothiazide (HYDRODIURIL) 25 MG tablet Take 1 tablet (25 mg total) by mouth daily. 03/05/14  Yes Raeford Razor, MD  HYDROcodone-acetaminophen (NORCO/VICODIN) 5-325 MG per tablet Take 1-2 tablets by mouth every 6 (six) hours as needed for moderate pain. 03/08/14  Yes Ejiroghene Wendall Stade, MD  Multiple Vitamin (MULTIVITAMIN WITH MINERALS) TABS tablet Take 1 tablet by mouth daily.   Yes Historical Provider, MD  oxyCODONE-acetaminophen (PERCOCET/ROXICET) 5-325 MG per tablet Take 1 tablet by mouth every 4 (four) hours as needed for severe pain. 03/05/14  Yes Raeford Razor, MD  thiamine 100 MG tablet Take 1 tablet (100 mg total) by mouth daily. 03/08/14  Yes Ejiroghene E Emokpae, MD  albuterol (PROVENTIL HFA;VENTOLIN HFA) 108 (90 BASE) MCG/ACT inhaler Inhale 2 puffs into the lungs every 6 (six) hours as needed for wheezing or shortness of breath. 01/05/14   Ky Barban, MD  buPROPion (WELLBUTRIN) 100 MG tablet Take 1 tablet (100 mg total) by mouth 3 (three)  times daily. 03/15/14 03/15/15  Jill AlexandersAlexa Richardson, MD  ondansetron (ZOFRAN) 4 MG tablet Take 1 tablet (4 mg total) by mouth every 6 (six) hours. Patient not taking: Reported on 04/11/2014 03/05/14   Raeford RazorStephen Kohut, MD   Triage Vitals: BP 126/97 mmHg  Pulse 90  Temp(Src) 97.9 F (36.6 C) (Oral)  Resp 22  Ht 6\' 2"  (1.88 m)  Wt 222 lb (100.699 kg)  BMI 28.49 kg/m2  SpO2 97%  Physical Exam  Constitutional: He is oriented to person, place, and time. He appears well-developed and well-nourished. He appears  distressed (uncomfortable appearing).  HENT:  Head: Normocephalic and atraumatic.  Nose: Nose normal.  Mouth/Throat: Oropharynx is clear and moist.  Eyes: Conjunctivae and EOM are normal. Pupils are equal, round, and reactive to light.  Neck: Normal range of motion. Neck supple. No JVD present. No tracheal deviation present. No thyromegaly present.  Cardiovascular: Normal rate, regular rhythm, normal heart sounds and intact distal pulses.  Exam reveals no gallop and no friction rub.   No murmur heard. Pulmonary/Chest: Effort normal and breath sounds normal. No stridor. No respiratory distress. He has no wheezes. He has no rales. He exhibits no tenderness.  Abdominal: Soft. Bowel sounds are normal. He exhibits no distension and no mass. There is tenderness (patient has tenderness in right upper quadrant epigastrium and left upper quadrant). There is no rebound and no guarding.  Musculoskeletal: Normal range of motion. He exhibits no edema or tenderness.  Lymphadenopathy:    He has no cervical adenopathy.  Neurological: He is alert and oriented to person, place, and time. He displays normal reflexes. He exhibits normal muscle tone. Coordination normal.  Skin: Skin is warm and dry. No rash noted. No erythema. No pallor.  Psychiatric: He has a normal mood and affect. His behavior is normal. Judgment and thought content normal.  Nursing note and vitals reviewed.   ED Course  Procedures (including critical care time)  DIAGNOSTIC STUDIES: Oxygen Saturation is 97% on room air, adequate by my interpretation.    COORDINATION OF CARE: At 0217 Discussed treatment plan with patient which includes pain medication and Zofran. Patient agrees.   Labs Review Labs Reviewed  CBC WITH DIFFERENTIAL - Abnormal; Notable for the following:    Hemoglobin 12.6 (*)    HCT 38.3 (*)    MCV 75.1 (*)    MCH 24.7 (*)    Platelets 144 (*)    All other components within normal limits  LIPASE, BLOOD - Abnormal;  Notable for the following:    Lipase 249 (*)    All other components within normal limits  BASIC METABOLIC PANEL - Abnormal; Notable for the following:    Sodium 134 (*)    Chloride 88 (*)    Calcium 10.7 (*)    Anion gap 23 (*)    All other components within normal limits  HEPATIC FUNCTION PANEL - Abnormal; Notable for the following:    Total Protein 8.9 (*)    AST 113 (*)    Total Bilirubin 1.4 (*)    Bilirubin, Direct 0.4 (*)    Indirect Bilirubin 1.0 (*)    All other components within normal limits  ETHANOL  HEPATIC FUNCTION PANEL  ETHANOL  URINALYSIS, ROUTINE W REFLEX MICROSCOPIC  CBG MONITORING, ED    Imaging Review No results found.   EKG Interpretation None      MDM   Final diagnoses:  Other acute pancreatitis    38 year old male history of pancreatitis.  Recent admission  for same, presents with epigastric pain radiating into his back.  Patient is adamant he has not had any alcohol in the past week.  Plan to check labs, treat pain and nausea.  Is able to tolerate by mouth, hope to be able to discharge home to follow-up with primary care.  I personally performed the services described in this documentation, which was scribed in my presence. The recorded information has been reviewed and is accurate.    3:56 AM Patient reports he is having persistent pain despite pain medication, drank some water with the Carafate and reports nausea and increasing pain.  Lipase is only mildly elevated, however he had similar elevation last ED visit and was discharged home and then returned and required admission.  Will discuss with internal medicine for readmission to their service.  Olivia Mackielga M Abdou Stocks, MD 04/11/14 [phone]

## 2014-04-11 NOTE — Progress Notes (Signed)
New Admission Note:   Arrival Method: Arrived via stretcher from ED Mental Orientation: Alert and Oriented x4 Telemetry: N/A Assessment: Completed Skin: Intact, Bilateral upper arm tattoos IV: Left AC infusing LR @ 200cc/hr Pain: 4/10 abdominal pain Tubes: N/A Safety Measures: Educated on fall prevention safety plan, patient acknowledged and understood. Admission: Completed [address] Orientation: Patient has been orientated to the room, unit and staff.  Family: N/A  Orders have been reviewed and implemented. Will continue to monitor the patient. Call light has been placed within reach.    Billy FischerLynn Avriana Joo, RN  Phone number: [phone]

## 2014-04-11 NOTE — Progress Notes (Signed)
Utilization review complete. Kelina Beauchamp RN CCM Case Mgmt phone [phone] 

## 2014-04-11 NOTE — Plan of Care (Signed)
Problem: Phase I Progression Outcomes Goal: OOB as tolerated unless otherwise ordered Outcome: Completed/Met Date Met:  04/11/14     

## 2014-04-11 NOTE — ED Notes (Signed)
Patient presents stating that his right upper abd is sore and painful  History of pancreatitis

## 2014-04-11 NOTE — Plan of Care (Signed)
Problem: Phase I Progression Outcomes Goal: Initial discharge plan identified Outcome: Completed/Met Date Met:  04/11/14     

## 2014-04-11 NOTE — ED Notes (Signed)
Patient reports more abdominal pain after consuming water. MD aware. Planning for admission.

## 2014-04-11 NOTE — Discharge Instructions (Signed)
Please try to gradually limit the amount of alcohol that you drink over time as this is likely causing you to have repeated bouts of pancreatitis. However, please take care to not suddenly stop as this could lead to dangerous alcohol withdrawal. We have set up a follow-up appointment in our clinic for you on December 22 at 2:15 pm.    Acute Pancreatitis Acute pancreatitis is a disease in which the pancreas becomes suddenly irritated (inflamed). The pancreas is a large gland behind your stomach. The pancreas makes enzymes that help digest food. The pancreas also makes 2 hormones that help control your blood sugar. Acute pancreatitis happens when the enzymes attack and damage the pancreas. Most attacks last a couple of days and can cause serious problems. HOME CARE  Follow your doctor's diet instructions. You may need to avoid alcohol and limit fat in your diet.  Eat small meals often.  Drink enough fluids to keep your pee (urine) clear or pale yellow.  Only take medicines as told by your doctor.  Avoid drinking alcohol if it caused your disease.  Do not smoke.  Get plenty of rest.  Check your blood sugar at home as told by your doctor.  Keep all doctor visits as told. GET HELP IF:  You do not get better as quickly as expected.  You have new or worsening symptoms.  You have lasting pain, weakness, or feel sick to your stomach (nauseous).  You get better and then have another pain attack. GET HELP RIGHT AWAY IF:   You are unable to eat or keep fluids down.  Your pain becomes severe.  You have a fever or lasting symptoms for more than 2 to 3 days.  You have a fever and your symptoms suddenly get worse.  Your skin or the white part of your eyes turn yellow (jaundice).  You throw up (vomit).  You feel dizzy, or you pass out (faint).  Your blood sugar is high (over 300 mg/dL). MAKE SURE YOU:   Understand these instructions.  Will watch your condition.  Will get help  right away if you are not doing well or get worse. Document Released: 10/08/2007 Document Revised: 09/05/2013 Document Reviewed: 07/31/2011 [facility] Patient Information 2015 [place]. This information is not intended to replace advice given to you by your health care provider. Make sure you discuss any questions you have with your health care provider.

## 2014-04-11 NOTE — ED Notes (Signed)
Discussed with patient that urine sample is still needed. He acknowledges, and reports no urge to void.

## 2014-04-11 NOTE — Progress Notes (Signed)
Subjective:  Patient states that he is doing a little better this morning. His epigastric pain is 4 out of 10 in severity. Patient did vomit after drinking some water in the morning, though he states that he is hungry since he has not eaten anything since Saturday. Patient is denying any trouble with breathing. Patient is asking when he would be able to leave.  Objective: Vital signs in last 24 hours: Filed Vitals:   04/11/14 0343 04/11/14 0443 04/11/14 0517 04/11/14 0923  BP: 110/71 107/62 122/74 116/65  Pulse: 94 82 82 74  Temp:   98.3 F (36.8 C) 98 F (36.7 C)  TempSrc:   Oral Oral  Resp: 19 14 16 17   Height:      Weight:   216 lb 2 oz (98.034 kg)   SpO2: 97% 94% 94% 95%   Weight change:   Intake/Output Summary (Last 24 hours) at 04/11/14 0954 Last data filed at 04/11/14 0549  Gross per 24 hour  Intake      0 ml  Output    200 ml  Net   -200 ml   General: resting in bed HEENT: PERRL, EOMI, no scleral icterus Cardiac: RRR, no rubs, murmurs or gallops Pulm: Expiratory wheezes bilaterally, no increased work of breathing Abd: Tenderness to palpation in the epigastrium with mild guarding, no rebound tenderness Ext: warm and well perfused, no pedal edema Neuro: alert and oriented X3, cranial nerves II-XII grossly intact Skin: no rashes or lesions noted Psych: appropriate affect  Lab Results: Basic Metabolic Panel:  Recent Labs Lab 04/11/14 0131 04/11/14 0730  NA 134* 133*  K 3.7 3.8  CL 88* 88*  CO2 23 24  GLUCOSE 85 79  BUN 16 14  CREATININE 0.93 0.67  CALCIUM 10.7* 9.9   Liver Function Tests:  Recent Labs Lab 04/11/14 0131  AST 113*  ALT 52  ALKPHOS 59  BILITOT 1.4*  PROT 8.9*  ALBUMIN 4.5    Recent Labs Lab 04/11/14 0131  LIPASE 249*   No results for input(s): AMMONIA in the last 168 hours. CBC:  Recent Labs Lab 04/11/14 0131  WBC 8.1  NEUTROABS 5.8  HGB 12.6*  HCT 38.3*  MCV 75.1*  PLT 144*   Cardiac Enzymes: No results for  input(s): CKTOTAL, CKMB, CKMBINDEX, TROPONINI in the last 168 hours. BNP: No results for input(s): PROBNP in the last 168 hours. D-Dimer: No results for input(s): DDIMER in the last 168 hours. CBG:  Recent Labs Lab 04/11/14 0111  GLUCAP 93   Hemoglobin A1C: No results for input(s): HGBA1C in the last 168 hours. Fasting Lipid Panel: No results for input(s): CHOL, HDL, LDLCALC, TRIG, CHOLHDL, LDLDIRECT in the last 168 hours. Thyroid Function Tests: No results for input(s): TSH, T4TOTAL, FREET4, T3FREE, THYROIDAB in the last 168 hours. Coagulation: No results for input(s): LABPROT, INR in the last 168 hours. Anemia Panel: No results for input(s): VITAMINB12, FOLATE, FERRITIN, TIBC, IRON, RETICCTPCT in the last 168 hours. Urine Drug Screen: Drugs of Abuse     Component Value Date/Time   LABOPIA POSITIVE* 12/29/2013 1050   COCAINSCRNUR NONE DETECTED 12/29/2013 1050   LABBENZ NONE DETECTED 12/29/2013 1050   AMPHETMU NONE DETECTED 12/29/2013 1050   THCU NONE DETECTED 12/29/2013 1050   LABBARB NONE DETECTED 12/29/2013 1050    Alcohol Level:  Recent Labs Lab 04/11/14 0131  ETH <11   Urinalysis:  Recent Labs Lab 04/11/14 0543  COLORURINE AMBER*  LABSPEC 1.026  PHURINE 6.0  GLUCOSEU NEGATIVE  HGBUR NEGATIVE  BILIRUBINUR MODERATE*  KETONESUR 40*  PROTEINUR NEGATIVE  UROBILINOGEN 1.0  NITRITE NEGATIVE  LEUKOCYTESUR NEGATIVE   Micro Results: No results found for this or any previous visit (from the past 240 hour(s)). Studies/Results: No results found. Medications: I have reviewed the patient's current medications. Scheduled Meds: . buPROPion  100 mg Oral TID  . folic acid  1 mg Oral Daily  . heparin  5,000 Units Subcutaneous 3 times per day  . lactated ringers  2,000 mL Intravenous Once  . LORazepam  0-4 mg Oral Q6H   Followed by  . [START ON 04/13/2014] LORazepam  0-4 mg Oral Q12H  . multivitamin with minerals  1 tablet Oral Daily  . nicotine  21 mg  Transdermal Daily  . thiamine  100 mg Oral Daily   Or  . thiamine  100 mg Intravenous Daily   Continuous Infusions: . lactated ringers 200 mL/hr at 04/11/14 0446   PRN Meds:.albuterol, LORazepam **OR** LORazepam, morphine injection, ondansetron (ZOFRAN) IV Assessment/Plan: Principal Problem:   Acute alcoholic pancreatitis Active Problems:   Alcohol abuse   Essential hypertension   Increased anion gap metabolic acidosis   Acute pancreatitis  Patient is a 38 year old gentleman with a history of alcohol-induced pancreatitis, alcohol abuse, hypertension, and asthma who presents with acute alcoholic pancreatitis.  Acute Alcoholic Pancreatitis: Patient presenting with epigastric abdominal pain and no oral intake since Saturday along with nausea and vomiting. His admission lipase was 249 and patient states that he has had pancreatitis 2 times in the past. Patient is afebrile and hemodynamically stable. Patient states that he last drank 5-6 days ago and his admission alcohol level was negative. Low suspicion for other etiologies of pancreatitis. No signs of infection. Triglycerides within normal limits from November 2015. No evidence of severe disease, no leukocytosis. -Continue NPO given recent vomiting, but consider advancing once tolerating. -Continue lactated Ringer's at 200 mL/h -Continue with Zofran for nausea and vomiting -Continue with morphine 2 milligrams every 2 hours, but consider weaning.  Asthma: Patient states he had asthma as a child but has not required any albuterol for a long time. Patient's lung exam is remarkable for bilateral wheezing, though patient denies any dyspnea. Patient satting well on room air. -Resume albuterol as needed  Microcytic anemia: Hemoglobin is 12.6 stable from priors from one month ago. In the setting of alcohol abuse, would usually expect a macrocytosis instead, though alcohol associated sideroblastic anemia is also a possibility.  Tobacco abuse:  Patient states that he is trying to quit smoking. He has not started his Wellbutrin 100 mg 3 times a day yet. -Although initially prescribed, we will now hold his Wellbutrin. There is risk that Wellbutrin can lower alcohol tolerance and seizure threshold, which makes it a nonideal therapy for this patient.  Alcoholic/starvation ketosis: Anion gap this morning 21, down from 23 on admission. Bicarbonate has remained within normal limits. Ketones positive in the urine. -Continue with hydration with lactated Ringers -Consider addition of dextrose should patient not tolerate diet soon.  Alcohol Abuse with potential for withdrawal: Patient states that he drinks 2 pints of liquor a day but has recently cut down to a half to 1 pint per day. Patient last drank 5 days before presentation (6 days ago). Ethanol level upon admission was negative. Admission LFTs remarkable for mild alcoholic hepatitis with an AST of 113 and ALT of 52. CIWA at around 4.  -Thiamine, folic acid, multivitamin  HTN: Patient has been normotensive since admission. Patient takes  hydrochlorothiazide 25 mg at home. -Continue to hold home hydrochlorothiazide given normotension -Consider adding back on once blood pressures recover pending fluid resuscitation.  Diet: NPO, lactated Ringers at 200 mL per hour DVT/PE PPx: Heparin SQ Dispo: Disposition is deferred at this time, awaiting improvement of current medical problems. Anticipated discharge in approximately 1-2 day(s).   Dispo: Disposition is deferred at this time, awaiting improvement of current medical problems.  Anticipated discharge in approximately 2 day(s).   The patient does have a current PCP (Jill AlexandersAlexa Richardson, MD) and [facility].  The patient does not have transportation limitations that hinder transportation to clinic appointments.   LOS: 0 days   Harold BarbanLawrence Ilan Kahrs, MD 04/11/2014, 9:54 AM

## 2014-04-12 DIAGNOSIS — J45909 Unspecified asthma, uncomplicated: Secondary | ICD-10-CM

## 2014-04-12 DIAGNOSIS — F17200 Nicotine dependence, unspecified, uncomplicated: Secondary | ICD-10-CM

## 2014-04-12 LAB — BASIC METABOLIC PANEL
Anion gap: 13 (ref 5–15)
BUN: 6 mg/dL (ref 6–23)
CALCIUM: 9.8 mg/dL (ref 8.4–10.5)
CO2: 27 mEq/L (ref 19–32)
CREATININE: 0.62 mg/dL (ref 0.50–1.35)
Chloride: 95 mEq/L — ABNORMAL LOW (ref 96–112)
GLUCOSE: 117 mg/dL — AB (ref 70–99)
Potassium: 3.4 mEq/L — ABNORMAL LOW (ref 3.7–5.3)
Sodium: 135 mEq/L — ABNORMAL LOW (ref 137–147)

## 2014-04-12 MED ORDER — POTASSIUM CHLORIDE CRYS ER 20 MEQ PO TBCR
40.0000 meq | EXTENDED_RELEASE_TABLET | Freq: Once | ORAL | Status: AC
  Administered 2014-04-12: 40 meq via ORAL
  Filled 2014-04-12: qty 2

## 2014-04-12 MED ORDER — POTASSIUM CHLORIDE CRYS ER 20 MEQ PO TBCR
40.0000 meq | EXTENDED_RELEASE_TABLET | Freq: Once | ORAL | Status: DC
  Filled 2014-04-12: qty 2

## 2014-04-12 NOTE — Plan of Care (Signed)
Problem: Phase I Progression Outcomes Goal: Pain controlled with appropriate interventions Outcome: Completed/Met Date Met:  04/12/14     

## 2014-04-12 NOTE — Progress Notes (Signed)
Subjective:  Patient states that he is doing better this morning, his pain is 1 out of 10 in severity, down from 4 yesterday. Patient has not had any recurrent episodes of nausea or vomiting on his clear liquid diet. Patient states that maybe these feeling a little gas but is able to have flatus. Patient is asking whether he would be ready for discharge today. Patient is denying trouble with breathing over the last day.  Objective: Vital signs in last 24 hours: Filed Vitals:   04/11/14 0517 04/11/14 0923 04/11/14 2151 04/12/14 0544  BP: 122/74 116/65 129/79 128/77  Pulse: 82 74 70 62  Temp: 98.3 F (36.8 C) 98 F (36.7 C) 98 F (36.7 C) 98.2 F (36.8 C)  TempSrc: Oral Oral Oral Oral  Resp: 16 17 18 18   Height:      Weight: 216 lb 2 oz (98.034 kg)     SpO2: 94% 95% 96% 97%   Weight change:  No intake or output data in the 24 hours ending 04/12/14 0844 General: resting in bed HEENT: PERRL, EOMI, no scleral icterus Cardiac: RRR, no rubs, murmurs or gallops Pulm: Expiratory wheezes bilaterally, no increased work of breathing Abd: Tenderness to palpation in the epigastrium with mild guarding, no rebound tenderness Ext: warm and well perfused, no pedal edema Neuro: alert and oriented X3, cranial nerves II-XII grossly intact Skin: no rashes or lesions noted Psych: appropriate affect  Lab Results: Basic Metabolic Panel:  Recent Labs Lab 04/11/14 0730 04/12/14 0528  NA 133* 135*  K 3.8 3.4*  CL 88* 95*  CO2 24 27  GLUCOSE 79 117*  BUN 14 6  CREATININE 0.67 0.62  CALCIUM 9.9 9.8   Liver Function Tests:  Recent Labs Lab 04/11/14 0131  AST 113*  ALT 52  ALKPHOS 59  BILITOT 1.4*  PROT 8.9*  ALBUMIN 4.5    Recent Labs Lab 04/11/14 0131  LIPASE 249*   No results for input(s): AMMONIA in the last 168 hours. CBC:  Recent Labs Lab 04/11/14 0131  WBC 8.1  NEUTROABS 5.8  HGB 12.6*  HCT 38.3*  MCV 75.1*  PLT 144*   Cardiac Enzymes: No results for  input(s): CKTOTAL, CKMB, CKMBINDEX, TROPONINI in the last 168 hours. BNP: No results for input(s): PROBNP in the last 168 hours. D-Dimer: No results for input(s): DDIMER in the last 168 hours. CBG:  Recent Labs Lab 04/11/14 0111  GLUCAP 93   Hemoglobin A1C: No results for input(s): HGBA1C in the last 168 hours. Fasting Lipid Panel: No results for input(s): CHOL, HDL, LDLCALC, TRIG, CHOLHDL, LDLDIRECT in the last 168 hours. Thyroid Function Tests: No results for input(s): TSH, T4TOTAL, FREET4, T3FREE, THYROIDAB in the last 168 hours. Coagulation: No results for input(s): LABPROT, INR in the last 168 hours. Anemia Panel: No results for input(s): VITAMINB12, FOLATE, FERRITIN, TIBC, IRON, RETICCTPCT in the last 168 hours. Urine Drug Screen: Drugs of Abuse     Component Value Date/Time   LABOPIA POSITIVE* 12/29/2013 1050   COCAINSCRNUR NONE DETECTED 12/29/2013 1050   LABBENZ NONE DETECTED 12/29/2013 1050   AMPHETMU NONE DETECTED 12/29/2013 1050   THCU NONE DETECTED 12/29/2013 1050   LABBARB NONE DETECTED 12/29/2013 1050    Alcohol Level:  Recent Labs Lab 04/11/14 0131  ETH <11   Urinalysis:  Recent Labs Lab 04/11/14 0543  COLORURINE AMBER*  LABSPEC 1.026  PHURINE 6.0  GLUCOSEU NEGATIVE  HGBUR NEGATIVE  BILIRUBINUR MODERATE*  KETONESUR 40*  PROTEINUR NEGATIVE  UROBILINOGEN 1.0  NITRITE NEGATIVE  LEUKOCYTESUR NEGATIVE   Micro Results: No results found for this or any previous visit (from the past 240 hour(s)). Studies/Results: No results found. Medications: I have reviewed the patient's current medications. Scheduled Meds: . folic acid  1 mg Oral Daily  . heparin  5,000 Units Subcutaneous 3 times per day  . lactated ringers  2,000 mL Intravenous Once  . LORazepam  0-4 mg Oral Q6H   Followed by  . [START ON 04/13/2014] LORazepam  0-4 mg Oral Q12H  . multivitamin with minerals  1 tablet Oral Daily  . nicotine  21 mg Transdermal Daily  . polyethylene  glycol  17 g Oral Daily  . thiamine  100 mg Oral Daily   Or  . thiamine  100 mg Intravenous Daily   Continuous Infusions: . dextrose 5% lactated ringers 125 mL/hr at 04/11/14 1345   PRN Meds:.albuterol, LORazepam **OR** LORazepam, morphine injection, ondansetron (ZOFRAN) IV, oxyCODONE-acetaminophen Assessment/Plan: Principal Problem:   Acute alcoholic pancreatitis Active Problems:   Alcohol abuse   Essential hypertension   Increased anion gap metabolic acidosis   Acute pancreatitis  Patient is a 38 year old gentleman with a history of alcohol-induced pancreatitis, alcohol abuse, hypertension, and asthma who presents with acute alcoholic pancreatitis.  Acute Alcoholic Pancreatitis: Patient's pain level has improved down to a 1 out of 10 in severity. Patient is tolerating a liquid clear diet. Patient had been fasting since December 5, 4 days ago. Patient remains afebrile and hemodynamically stable. Patient last received IV pain medications last night at 2000, and has been managed with Percocet since then. -Advance to soft diet -Consider discontinuing IV fluids pending toleration of advanced diet. -Continue with Zofran for nausea and vomiting, though no requirement in the last 24 hour -Morphine decreased to 2 mg every 4 hours with preference for Percocets for pain management first.  Asthma: Patient states he had asthma as a child but has not required any albuterol for a long time. Patient's lung exam is remarkable for bilateral wheezing, though patient denies any dyspnea. Patient satting well on room air. -Resumed albuterol as needed  Microcytic anemia: Hemoglobin is 12.6 stable from priors from one month ago. In the setting of alcohol abuse, would usually expect a macrocytosis instead, though alcohol associated sideroblastic anemia is also a possibility.  Tobacco abuse: Patient states that he is trying to quit smoking. He has not started his Wellbutrin 100 mg 3 times a day  yet. -Although initially prescribed, we will now hold his Wellbutrin. There is risk that Wellbutrin can lower alcohol tolerance and seizure threshold, which makes it a nonideal therapy for this patient.  Alcohol Abuse with potential for withdrawal: CIWA has remained 0 over last 24 hours, patient almost a week out from his last drink. -Thiamine, folic acid, multivitamin  HTN: Patient has been normotensive since admission. Patient takes hydrochlorothiazide 25 mg at home. -Continue to hold home hydrochlorothiazide given normotension -Consider adding back on once blood pressures recover pending fluid resuscitation.  Diet: soft, D5 lactated Ringers at 125 mL per hour DVT/PE PPx: Heparin SQ Dispo: Disposition is deferred at this time, awaiting improvement of current medical problems. Anticipated discharge is today.   Resolved: Alcoholic/starvation ketosis.  The patient does have a current PCP (Jill AlexandersAlexa Richardson, MD) and does [facility] after discharge.  The patient does not have transportation limitations that hinder transportation to clinic appointments.   LOS: 1 day   Harold BarbanLawrence Deshawnda Acrey, MD 04/12/2014, 8:44 AM

## 2014-04-13 MED ORDER — ADULT MULTIVITAMIN W/MINERALS CH: 1.0000 | ORAL_TABLET | Freq: Every day | ORAL | Status: DC

## 2014-04-13 MED ORDER — OXYCODONE-ACETAMINOPHEN 5-325 MG PO TABS: 1.0000 | ORAL_TABLET | ORAL | Status: DC | PRN

## 2014-04-13 NOTE — Plan of Care (Signed)
Problem: Phase I Progression Outcomes Goal: Hemodynamically stable Outcome: Completed/Met Date Met:  04/13/14 Goal: Nausea/vomiting controlled after medication Outcome: Completed/Met Date Met:  04/13/14  Problem: Phase II Progression Outcomes Goal: Nausea/vomiting controlled with medication Outcome: Completed/Met Date Met:  04/13/14

## 2014-04-13 NOTE — Plan of Care (Signed)
Problem: Discharge Progression Outcomes Goal: Pain controlled with appropriate interventions Outcome: Completed/Met Date Met:  04/13/14

## 2014-04-13 NOTE — Plan of Care (Signed)
Problem: Phase III Progression Outcomes Goal: Discharge plan remains appropriate-arrangements made Outcome: Completed/Met Date Met:  04/13/14

## 2014-04-13 NOTE — Progress Notes (Signed)
.   Subjective:  Patient states that he had some moderate pain, 3 out of 10 in severity after eating last night. Patient states that he was able to do much better with breakfast this morning. Patient has not had any nausea or vomiting for more than 2 days now. Patient states that he does feel a little gas that is uncomfortable. Patient is again asking whether he would be ready for discharge today. Patient denies any troubles with his breathing over the last day.  Objective: Vital signs in last 24 hours: Filed Vitals:   04/12/14 1735 04/12/14 2209 04/13/14 0610 04/13/14 1000  BP: 148/78 121/76 109/90 147/70  Pulse: 75 75 80 85  Temp: 99 F (37.2 C) 98.3 F (36.8 C) 97.7 F (36.5 C) 97.6 F (36.4 C)  TempSrc: Oral Oral Oral Oral  Resp: 20 18 19 18   Height:      Weight:      SpO2: 98% 99% 95% 95%   Weight change:   Intake/Output Summary (Last 24 hours) at 04/13/14 1316 Last data filed at 04/13/14 0900  Gross per 24 hour  Intake    960 ml  Output      0 ml  Net    960 ml   General: Standing up in the room HEENT: PERRL, EOMI, no scleral icterus Cardiac: RRR, no rubs, murmurs or gallops Pulm: Clear to auscultation bilaterally, no increased work of breathing Abd: Mild tenderness to palpation with no guarding, improvement over the interval, no rebound tenderness Ext: warm and well perfused, no pedal edema Neuro: alert and oriented X3, cranial nerves II-XII grossly intact Skin: no rashes or lesions noted Psych: appropriate affect  Lab Results: Basic Metabolic Panel:  Recent Labs Lab 04/11/14 0730 04/12/14 0528  NA 133* 135*  K 3.8 3.4*  CL 88* 95*  CO2 24 27  GLUCOSE 79 117*  BUN 14 6  CREATININE 0.67 0.62  CALCIUM 9.9 9.8   Liver Function Tests:  Recent Labs Lab 04/11/14 0131  AST 113*  ALT 52  ALKPHOS 59  BILITOT 1.4*  PROT 8.9*  ALBUMIN 4.5    Recent Labs Lab 04/11/14 0131  LIPASE 249*   No results for input(s): AMMONIA in the last 168  hours. CBC:  Recent Labs Lab 04/11/14 0131  WBC 8.1  NEUTROABS 5.8  HGB 12.6*  HCT 38.3*  MCV 75.1*  PLT 144*   Cardiac Enzymes: No results for input(s): CKTOTAL, CKMB, CKMBINDEX, TROPONINI in the last 168 hours. BNP: No results for input(s): PROBNP in the last 168 hours. D-Dimer: No results for input(s): DDIMER in the last 168 hours. CBG:  Recent Labs Lab 04/11/14 0111  GLUCAP 93   Hemoglobin A1C: No results for input(s): HGBA1C in the last 168 hours. Fasting Lipid Panel: No results for input(s): CHOL, HDL, LDLCALC, TRIG, CHOLHDL, LDLDIRECT in the last 168 hours. Thyroid Function Tests: No results for input(s): TSH, T4TOTAL, FREET4, T3FREE, THYROIDAB in the last 168 hours. Coagulation: No results for input(s): LABPROT, INR in the last 168 hours. Anemia Panel: No results for input(s): VITAMINB12, FOLATE, FERRITIN, TIBC, IRON, RETICCTPCT in the last 168 hours. Urine Drug Screen: Drugs of Abuse     Component Value Date/Time   LABOPIA POSITIVE* 12/29/2013 1050   COCAINSCRNUR NONE DETECTED 12/29/2013 1050   LABBENZ NONE DETECTED 12/29/2013 1050   AMPHETMU NONE DETECTED 12/29/2013 1050   THCU NONE DETECTED 12/29/2013 1050   LABBARB NONE DETECTED 12/29/2013 1050    Alcohol Level:  Recent Labs  Lab 04/11/14 0131  ETH <11   Urinalysis:  Recent Labs Lab 04/11/14 0543  COLORURINE AMBER*  LABSPEC 1.026  PHURINE 6.0  GLUCOSEU NEGATIVE  HGBUR NEGATIVE  BILIRUBINUR MODERATE*  KETONESUR 40*  PROTEINUR NEGATIVE  UROBILINOGEN 1.0  NITRITE NEGATIVE  LEUKOCYTESUR NEGATIVE   Micro Results: No results found for this or any previous visit (from the past 240 hour(s)). Studies/Results: No results found. Medications: I have reviewed the patient's current medications. Scheduled Meds: . folic acid  1 mg Oral Daily  . heparin  5,000 Units Subcutaneous 3 times per day  . lactated ringers  2,000 mL Intravenous Once  . LORazepam  0-4 mg Oral Q12H  . multivitamin with  minerals  1 tablet Oral Daily  . nicotine  21 mg Transdermal Daily  . polyethylene glycol  17 g Oral Daily  . thiamine  100 mg Oral Daily   Continuous Infusions: . dextrose 5% lactated ringers 125 mL/hr at 04/13/14 0901   PRN Meds:.albuterol, LORazepam **OR** LORazepam, morphine injection, ondansetron (ZOFRAN) IV, oxyCODONE-acetaminophen Assessment/Plan: Principal Problem:   Acute alcoholic pancreatitis Active Problems:   Alcohol abuse   Essential hypertension   Increased anion gap metabolic acidosis   Acute pancreatitis  Patient is a 38 year old gentleman with a history of alcohol-induced pancreatitis, alcohol abuse, hypertension, and asthma who presents with acute alcoholic pancreatitis.  Acute Alcoholic Pancreatitis: Patient states that he was able to tolerate his breakfast well today with no nausea or significant pain. Patient is currently on a soft diet. Patient has been fasting since December 5, 5 days ago. Patient remains afebrile and hemodynamically stable. Patient has not had any requirement for pain medication since last night. -Continue with soft diet -Continue to see if patient is able to tolerate by mouth Percocets consider morphine -Continue to monitor for potential discharge later today if showing clinical improvement.  Asthma: Has had asthma since a child but exam initially remarkable for bilateral wheezing. Wheezing is no longer present on exam today. Patient has been satting well on room air and not complaining of any wheezing or dyspnea. -Albuterol as needed  Tobacco abuse: Patient states that he is trying to quit smoking. He has not started his Wellbutrin 100 mg 3 times a day yet. -Although initially prescribed, we will now hold his Wellbutrin. There is risk that Wellbutrin can lower alcohol tolerance and seizure threshold, which makes it a nonideal therapy for this patient.  Alcohol Abuse with potential for withdrawal: Patient has had continued minimal scores on  CIWA, now past the window for alcohol withdrawal. -Thiamine, folic acid and multivitamin  HTN: Patient has mostly been normotensive throughout this hospitalization. Takes hydrochlorothiazide 25 mg daily at home. -Will continue to monitor blood pressures and resume upon discharge.  Diet: soft, D5 lactated Ringers at 125 mL per hour DVT/PE PPx: Heparin SQ Dispo:  Anticipated discharge is today or tomorrow.  Resolved: Alcoholic/starvation ketosis. Microcytic anemia  The patient does have a current PCP (Alexa Senaida Oresichardson, MD) and [facility].  The patient does not have transportation limitations that hinder transportation to clinic appointments.   LOS: 2 days   Harold BarbanLawrence Parth Mccormac, MD 04/13/2014, 1:16 PM

## 2014-04-13 NOTE — Progress Notes (Signed)
Discharge instructions reviewed with pt; allowing time for questions. Pt verbalized understanding. IV removed without issue. Prescriptions given to pt. Pt given work excuse also. Pt leaving unit ambulatory accompanied by Clinical research associatewriter.

## 2014-04-13 NOTE — Plan of Care (Signed)
Problem: Discharge Progression Outcomes Goal: Tolerating diet Outcome: Completed/Met Date Met:  04/13/14     

## 2014-04-13 NOTE — Plan of Care (Signed)
Problem: Discharge Progression Outcomes Goal: Activity appropriate for discharge plan Outcome: Completed/Met Date Met:  04/13/14     

## 2014-04-13 NOTE — Plan of Care (Signed)
Problem: Phase III Progression Outcomes Goal: Tolerating diet Outcome: Completed/Met Date Met:  04/13/14

## 2014-04-13 NOTE — Plan of Care (Signed)
Problem: Phase III Progression Outcomes Goal: Absence of DT's (Delirium Tremors) Outcome: Completed/Met Date Met:  04/13/14

## 2014-04-13 NOTE — Plan of Care (Signed)
Problem: Discharge Progression Outcomes Goal: Afebrile times 24 hours Outcome: Completed/Met Date Met:  04/13/14

## 2014-04-13 NOTE — Plan of Care (Signed)
Problem: Discharge Progression Outcomes Goal: Discharge plan in place and appropriate Outcome: Completed/Met Date Met:  04/13/14     

## 2014-04-14 NOTE — Discharge Summary (Signed)
Name: Riley Kim MRN: [id number] DOB: [birth year] 38 y.o. PCP: Jill Alexanders, MD  Date of Admission: 04/11/2014  1:17 AM Date of Discharge: 04/14/2014 Attending Physician: No att. providers found  Discharge Diagnosis: Principal Problem:   Acute alcoholic pancreatitis Active Problems:   Alcohol abuse   Essential hypertension   Increased anion gap metabolic acidosis   Acute pancreatitis  Discharge Medications:   Medication List    STOP taking these medications        buPROPion 100 MG tablet  Commonly known as:  WELLBUTRIN      TAKE these medications        albuterol 108 (90 BASE) MCG/ACT inhaler  Commonly known as:  PROVENTIL HFA;VENTOLIN HFA  Inhale 2 puffs into the lungs every 6 (six) hours as needed for wheezing or shortness of breath.     folic acid 1 MG tablet  Commonly known as:  FOLVITE  Take 1 tablet (1 mg total) by mouth daily.     hydrochlorothiazide 25 MG tablet  Commonly known as:  HYDRODIURIL  Take 1 tablet (25 mg total) by mouth daily.     HYDROcodone-acetaminophen 5-325 MG per tablet  Commonly known as:  NORCO/VICODIN  Take 1-2 tablets by mouth every 6 (six) hours as needed for moderate pain.     multivitamin with minerals Tabs tablet  Take 1 tablet by mouth daily.     multivitamin with minerals Tabs tablet  Take 1 tablet by mouth daily.     ondansetron 4 MG tablet  Commonly known as:  ZOFRAN  Take 1 tablet (4 mg total) by mouth every 6 (six) hours.     oxyCODONE-acetaminophen 5-325 MG per tablet  Commonly known as:  PERCOCET/ROXICET  Take 1 tablet by mouth every 4 (four) hours as needed for moderate pain (please offer before morphine).     thiamine 100 MG tablet  Take 1 tablet (100 mg total) by mouth daily.        Disposition and follow-up:   Riley Kim was discharged from [facility] in Good condition.  At the hospital follow up visit please address:  1.  Resolution of pancreatitis symptoms. Consider  alternative therapy for smoking cessation.  2.  Labs / imaging needed at time of follow-up: none  3.  Pending labs/ test needing follow-up: none  Follow-up Appointments:   Discharge Instructions:     Discharge Instructions    Call MD for:  difficulty breathing, headache or visual disturbances    Complete by:  As directed      Call MD for:  extreme fatigue    Complete by:  As directed      Call MD for:  hives    Complete by:  As directed      Call MD for:  persistant dizziness or light-headedness    Complete by:  As directed      Call MD for:  persistant nausea and vomiting    Complete by:  As directed      Call MD for:  redness, tenderness, or signs of infection (pain, swelling, redness, odor or green/yellow discharge around incision site)    Complete by:  As directed      Call MD for:  severe uncontrolled pain    Complete by:  As directed      Call MD for:  temperature >100.4    Complete by:  As directed      Diet - low sodium heart healthy  Complete by:  As directed      Increase activity slowly    Complete by:  As directed            Consultations:    Procedures Performed:  No results found.  Admission HPI:   Riley Kim is a 38yo man w/ PMHx of alcohol-induced pancreatitis (last admission 03/05/14), alcohol abuse, HTN, and asthma who presents to the ED with abdominal pain and decreased appetite for 3 days. Patient states he woke up Saturday morning and ate a breakfast burrito when he suddenly felt abdominal pain. He describes the pain as 7/10 in severity, sharp, epigastric, radiating to his back, and worse with PO intake. He reports he knew his pain was pancreatitis because he has had similar pain before. He states he has not been able to keep anything down since Saturday, except for small amounts of water. He denies fever and chills. He reports he last drank alcohol on Thursday (04/06/14). He states he normally drinks 2 pints per day, but has recently cut down to  1/2-1 pint per day. He states he knows that drinking alcohol has caused his pain.   In the ED he received a 2 L bolus of lactated ringers, Protonix 40 mg, and Dilaudid and Fentanyl for pain. Lipase was found to be 249 and alcohol level was negative.   Hospital Course by problem list: Principal Problem:   Acute alcoholic pancreatitis Active Problems:   Alcohol abuse   Essential hypertension   Increased anion gap metabolic acidosis   Acute pancreatitis   Acute Alcoholic Pancreatitis: Patient with heavy alcohol abuse previous admissions for pancreatitis. Patient initially presented to the emergency department with 7 out of 10 abdominal pain that was epigastric and radiating to his back along with some nausea and vomiting. I pace was 249. No imaging was performed. Patient states that for several days prior to admission, he had a ready not been eating. Upon admission, patient was started on IV fluids and managed with IV morphine. Patient was NPO and then transitioned to clear liquids. Patient did not have any nausea or vomiting but did have some mild pain with advancement of his diet. By the day of admission, patient's pain was much improved on a soft diet the patient was discharged home with Percocet for further management as needed.   Asthma:  patient has had asthma since a child and exam was initially remarkable for bilateral wheezing. Wheezing was no longer present on serial exams. Patient satting well on room air and was not complaining of any wheezing or dyspnea throughout his admission. Patient discharged home on his albuterol.   Tobacco abuse: patient states that he is trying to quit smoking and had been prescribed Wellbutrin 100 mg 3 times a day. Patient was discontinued on this because of his history of alcohol use and risk for alcohol tolerance and the lowering of seizure threshold. Would recommend alternative smoking cessation therapies as an outpatient.   Alcohol Abuse with potential for  withdrawal:  Patient had minimal scores on CIWA throughout his hospitalization. Patient was given thiamine, folic acid, and multivitamin.   HTN:  patient was mostly normotensive throughout his hospitalization and his hydrochlorothiazide was held pending fluid resuscitation. His hydrochlorothiazide was resumed upon discharge.   Discharge Vitals:   BP 147/70 mmHg  Pulse 85  Temp(Src) 97.6 F (36.4 C) (Oral)  Resp 18  Ht 6\' 2"  (1.88 m)  Wt 216 lb 2 oz (98.034 kg)  BMI 27.74 kg/m2  SpO2 95%  Discharge Labs:  No results found for this or any previous visit (from the past 24 hour(s)).  Signed: Harold BarbanLawrence Cong Hightower, MD 04/14/2014, 4:48 PM    Services Ordered on Discharge: none Equipment Ordered on Discharge: none

## 2014-04-25 ENCOUNTER — Encounter: Payer: Self-pay | Admitting: Internal Medicine

## 2014-04-25 ENCOUNTER — Other Ambulatory Visit: Payer: Self-pay | Admitting: Internal Medicine

## 2014-04-25 ENCOUNTER — Ambulatory Visit (INDEPENDENT_AMBULATORY_CARE_PROVIDER_SITE_OTHER): Payer: Self-pay | Admitting: Internal Medicine

## 2014-04-25 VITALS — BP 124/83 | HR 75 | Temp 98.2°F | Ht 74.0 in | Wt 225.8 lb

## 2014-04-25 DIAGNOSIS — K852 Alcohol induced acute pancreatitis without necrosis or infection: Secondary | ICD-10-CM

## 2014-04-25 DIAGNOSIS — I10 Essential (primary) hypertension: Secondary | ICD-10-CM

## 2014-04-25 DIAGNOSIS — Z720 Tobacco use: Secondary | ICD-10-CM

## 2014-04-25 DIAGNOSIS — F17210 Nicotine dependence, cigarettes, uncomplicated: Secondary | ICD-10-CM

## 2014-04-25 NOTE — Assessment & Plan Note (Addendum)
BP Readings from Last 3 Encounters:  04/25/14 124/83  04/13/14 147/70  03/15/14 109/78    Lab Results  Component Value Date   NA 135* 04/12/2014   K 3.4* 04/12/2014   CREATININE 0.62 04/12/2014    Assessment: Blood pressure control:  Well controlled Progress toward BP goal:   At goal Comments: Compliant with HCTZ 25 mg daily  Plan: Medications:  continue current medications Recheck BMET at follow up visit

## 2014-04-25 NOTE — Assessment & Plan Note (Signed)
  Assessment: Progress toward smoking cessation:    No progress Barriers to progress toward smoking cessation:    Stress, Addiction Comments: Smoking 1 ppd  Plan: Instruction/counseling given:  I counseled patient on the dangers of tobacco use, advised patient to stop smoking, and reviewed strategies to maximize success. Medications to assist with smoking cessation:  Patient cannot use Wellbutrin due to alcoholism and chance of decreasing seizure threshold. Patient states gum and patches don't work and is limited for financial reasons. Could consider Chantix in the future if patient obtains insurance.  Other plans: Continue to counsel on smoking cessation

## 2014-04-25 NOTE — Assessment & Plan Note (Signed)
Assessment: Patient presents for hospital follow up for alcoholic pancreatitis after discharge on 04/13/14. Patient denies any nausea, vomiting, diarrhea, abdominal pain or difficulty eating or drinking. He has been tolerating a regular diet. Patient has not required his nausea medication or pain medication and has brought in his bottles to show this. Unfortunately, patient continues to drink alcohol--about 1 pint of gin and 2 tall cans of beer every 2 days. Patient understands the risk of drinking any alcohol at all. He states his limitations to quitting are stress at home, work schedule, insurance, and addiction. He [facility], but [place] [employer] [place] fell through. Patient would like to get his insurance back to do this and to take Naltrexone in the future. He has not been able to attend AA meeting due to his work schedule.   Plan: -Made appointment with [facility] for financial counseling -Patient will contact Medicaid to understand options -Encouraged patient to seek help from AA -Provided written information -Consider Naltrexone and inpatient rehab in the future pending insurance -Continue Folic acid, multivitamins and thiamine

## 2014-04-25 NOTE — Patient Instructions (Signed)
General Instructions:   Thank you for bringing your medicines today. This helps us keep you safe from mistakes.  Alcohol Use Disorder Alcohol use disorder is a mental disorder. It is not a one-time incident of heavy drinking. Alcohol use disorder is the excessive and uncontrollable use of alcohol over time that leads to problems with functioning in one or more areas of daily living. People with this disorder risk harming themselves and others when they drink to excess. Alcohol use disorder also can cause other mental disorders, such as mood and anxiety disorders, and serious physical problems. People with alcohol use disorder often misuse other drugs.  Alcohol use disorder is common and widespread. Some people with this disorder drink alcohol to cope with or escape from negative life events. Others drink to relieve chronic pain or symptoms of mental illness. People with a family history of alcohol use disorder are at higher risk of losing control and using alcohol to excess.  SYMPTOMS  Signs and symptoms of alcohol use disorder may include the following:   Consumption ofalcohol inlarger amounts or over a longer period of time than intended.  Multiple unsuccessful attempts to cutdown or control alcohol use.   A great deal of time spent obtaining alcohol, using alcohol, or recovering from the effects of alcohol (hangover).  A strong desire or urge to use alcohol (cravings).   Continued use of alcohol despite problems at work, school, or home because of alcohol use.   Continued use of alcohol despite problems in relationships because of alcohol use.  Continued use of alcohol in situations when it is physically hazardous, such as driving a car.  Continued use of alcohol despite awareness of a physical or psychological problem that is likely related to alcohol use. Physical problems related to alcohol use can involve the brain, heart, liver, stomach, and intestines. Psychological problems  related to alcohol use include intoxication, depression, anxiety, psychosis, delirium, and dementia.   The need for increased amounts of alcohol to achieve the same desired effect, or a decreased effect from the consumption of the same amount of alcohol (tolerance).  Withdrawal symptoms upon reducing or stopping alcohol use, or alcohol use to reduce or avoid withdrawal symptoms. Withdrawal symptoms include:  Racing heart.  Hand tremor.  Difficulty sleeping.  Nausea.  Vomiting.  Hallucinations.  Restlessness.  Seizures. DIAGNOSIS Alcohol use disorder is diagnosed through an assessment by your health care provider. Your health care provider may start by asking three or four questions to screen for excessive or problematic alcohol use. To confirm a diagnosis of alcohol use disorder, at least two symptoms must be present within a 730-month period. The severity of alcohol use disorder depends on the number of symptoms:  Mild--two or three.  Moderate--four or five.  Severe--six or more. Your health care provider may perform a physical exam or use results from lab tests to see if you have physical problems resulting from alcohol use. Your health care provider may refer you to a mental health professional for evaluation. TREATMENT  Some people with alcohol use disorder are able to reduce their alcohol use to low-risk levels. Some people with alcohol use disorder need to quit drinking alcohol. When necessary, mental health professionals with specialized training in substance use treatment can help. Your health care provider can help you decide how severe your alcohol use disorder is and what type of treatment you need. The following forms of treatment are available:   Detoxification. Detoxification involves the use of prescription medicines to  prevent alcohol withdrawal symptoms in the first week after quitting. This is important for people with a history of symptoms of withdrawal and for  heavy drinkers who are likely to have withdrawal symptoms. Alcohol withdrawal can be dangerous and, in severe cases, cause death. Detoxification is usually provided in a hospital or in-patient substance use treatment facility.  Counseling or talk therapy. Talk therapy is provided by substance use treatment counselors. It addresses the reasons people use alcohol and ways to keep them from drinking again. The goals of talk therapy are to help people with alcohol use disorder find healthy activities and ways to cope with life stress, to identify and avoid triggers for alcohol use, and to handle cravings, which can cause relapse.  Medicines.Different medicines can help treat alcohol use disorder through the following actions:  Decrease alcohol cravings.  Decrease the positive reward response felt from alcohol use.  Produce an uncomfortable physical reaction when alcohol is used (aversion therapy).  Support groups. Support groups are run by people who have quit drinking. They provide emotional support, advice, and guidance. These forms of treatment are often combined. Some people with alcohol use disorder benefit from intensive combination treatment provided by specialized substance use treatment centers. Both inpatient and outpatient treatment programs are available. Document Released: 05/29/2004 Document Revised: 09/05/2013 Document Reviewed: 07/29/2012 [facility] Patient Information 2015 [place], [place]. This information is not intended to replace advice given to you by your health care provider. Make sure you discuss any questions you have with your health care provider.   Finding Treatment for Alcohol and Drug Addiction It can be hard to find the right place to get professional treatment. Here are some important things to consider:  There are different types of treatment to choose from.  Some programs are live-in (residential) while others are not (outpatient). Sometimes a combination is  offered.  No single type of program is right for everyone.  Most treatment programs involve a combination of education, counseling, and a 12-step, spiritually-based approach.  There are non-spiritually based programs (not 12-step).  Some treatment programs are government sponsored. They are geared for patients without private insurance.  Treatment programs can vary in many respects such as:  Cost and types of insurance accepted.  Types of on-site medical services offered.  Length of stay, setting, and size.  Overall philosophy of treatment. A person may need specialized treatment or have needs not addressed by all programs. For example, adolescents need treatment appropriate for their age. Other people have secondary disorders that must be managed as well. Secondary conditions can include mental illness, such as depression or diabetes. Often, a period of detoxification from alcohol or drugs is needed. This requires medical supervision and not all programs offer this. THINGS TO CONSIDER WHEN SELECTING A TREATMENT PROGRAM   Is the program certified by the appropriate government agency? Even private programs must be certified and employ certified professionals.  Does the program accept your insurance? If not, can a payment plan be set up?  Is the facility clean, organized, and well run? Do they allow you to speak with graduates who can share their treatment experience with you? Can you tour the facility? Can you meet with staff?  Does the program meet the full range of individual needs?  Does the treatment program address sexual orientation and physical disabilities? Do they provide age, gender, and culturally appropriate treatment services?  Is treatment available in languages other than English?  Is long-term aftercare support or guidance encouraged and provided?  Is assessment of an individual's treatment plan ongoing to ensure it meets changing needs?  Does the program use  strategies to encourage reluctant patients to remain in treatment long enough to increase the likelihood of success?  Does the program offer counseling (individual or group) and other behavioral therapies?  Does the program offer medicine as part of the treatment regimen, if needed?  Is there ongoing monitoring of possible relapse? Is there a defined relapse prevention program? Are services or referrals offered to family members to ensure they understand addiction and the recovery process? This would help them support the recovering individual.  Are 12-step meetings held at the center or is transport available for patients to attend outside meetings? In countries outside of the [place]. and [place], Magazine features editor for contact information for services in your area. Document Released: 03/20/2005 Document Revised: 07/14/2011 Document Reviewed: 09/30/2007 [facility] Patient Information 2015 [place], [place]. This information is not intended to replace advice given to you by your health care provider. Make sure you discuss any questions you have with your health care provider.   Addiction in the Family Alcoholism and drug addiction takes a toll on the family. Many families have at least one parent who needs treatment for alcohol or drug dependency. Many children are exposed to illegal drug use in these families. Children of addiction (or COA's) are at great risk for:  Mental illness or emotional problems, such as:  Depression.  Anxiety.  Physical health problems.  Learning problems.  Children whose parents abuse alcohol or drugs are almost three times more likely to be abused. The abuse can be verbal, physical, or sexual. They are 4 times more likely than other children to be neglected. Strong scientific evidence suggests that addiction runs in families. Children of alcoholics are 4 times more likely than non-COA's to develop alcoholism or other drug problems. RESEARCH Research shows that  many children with drug or alcohol dependent parents can benefit from adults who help and encourage them. Those children may cope well with the trauma of growing up in families affected by addiction. They often say that success is due to the support of:  A non-alcoholic parent.  Relative.  Teachers Other trusted adult in their lives that can help include:  Doctors.  Teachers.  Guidance counselors.  Theatre manager.  Social workers.  Consulting civil engineer.  Faith/spirituality Multimedia programmer. UNDERSTANDING CHILDREN AND ADDICTION Children in alcohol or drug dependent homes often live with denial, shame, and silence about their family problems. The chaos in the family may lead to a painful, unsafe setting. COA's often take on the parent's responsibilities. For many, this results in a loss of childhood. Without help these children grow into adults (Adult Children of Alcoholics or ACOA's) who spend much of their time trying to make up for hidden fears, shame and denied anger from childhood. Without treatment, these adults experience ongoing problem with interpersonal relationships and daily functioning.  Some COA's display negative behaviors that may warn adults of a problem at home. Others work hard to succeed and please in spite of the stress at home. The unstable home life may have a negative impact on their future. Children of alcoholics are more likely to have substance abuse problems. WHEN PARENTS RECEIVE TREATMENT Living with an active alcohol or drug dependent adult is hard for the whole family. Even having a parent go through treatment can be painful for children. This change can confuse children. When a parent receives treatment, each family member should also receive  appropriate services. That way all members of the family can recover from the impact of addiction. HOW YOU CAN HELP Adults can support COA's in these ways.   Provide children with information about addiction.  Tell them:  Alcohol/drug dependency is an illness. You did not cause it. You cannot fix it.  Take care of yourself by talking with a trusted person.  Make good choices in your own life.  Addiction treatment can help your parent get well.  You are not alone. You need and deserve help. There are safe people and places that can help you.  Teach children how to share their feelings in healthy ways. One way is by speaking with "safe" adults. Guide them to support programs at school or in your community. Such programs can help them develop coping skills.  Take the time to develop a healthy relationship with a COA who needs you. Children who live in addicted families learn not to trust adults. Listening to and supporting these children can change much of that mistrust. You can have a positive impact on a child's life.  If you can, guide the adults in the family to a qualified professional. He/she can help them get the treatment they need to begin recovery. One way to begin recovery is through an intervention. Only a Herbalist should lead an intervention. WHERE YOU AND COA'S CAN TURN FOR HELP Many resources can help adults identify and support COA's. Learn about local support groups such as Alateen and Al-Anon. School programs can assist COA's, too. [employer] Partners that can provide resource materials to caring adults as well as COA's. Just showing an interest in the child and offering support can make a difference in his/her life. For more information about how you can help children who live in alcohol or drug dependent families, please contact any one of the following organizations.  Organizations for [employer] and ACOA's The [employer] for Children of Alcoholics (NACoA) 888-55-4COAS (226)681-6004)  nacoa@nacoa .org www.nacoa.org  Al-Anon/Alateen  For Families and Friends of Alcoholics 888-4AL-ANON/5170342094  WSO@al -anon.org www.al-anon.org 800-ALCOHOL for information  and referrals Adult Children Of Alcoholics World Engineer, mining.adultchildren.org  Resources for information on substance abuse and treatment Center for Substance Abuse Treatment (CSAT) Substance Abuse and Mental Health Services Administration [facility]) CSAT's [employer] 800-662-HELP (Toll-Free) (530)391-3711 (TDD) (530) 707-5065 (Spanish) info@samhsa .gov SkateOasis.com.pt SAMHSA's National Clearinghouse for Alcohol and Drug Information (NCADI) 415-322-7247 408-650-2061 (TDD)  info@samhsa .gov SkateOasis.com.pt National Council on Alcoholism and Drug Dependence, Inc. (NCADD) 800-NCA-CALL  national@ncadd .org www.ncadd.org Office of [employer] (ONDCP) 906-304-8153  ondcp@ncjrs .org www.whitehousedrugpolicy.gov Daisy Floro: www.freevibe.com is an Occupational hygienist for youth 11-18 focusing on drug-specific information in an entertainment setting.  Adults can get drug-specific information and tips for children by visiting the multilingual website, www.theantidrug.com, designed by the [employer] to help adults keep kids healthy, safe and drug-free. Document Released: 12/26/2003 Document Revised: 07/14/2011 Document Reviewed: 07/11/2013 [facility] Patient Information 2015 [place], [place]. This information is not intended to replace advice given to you by your health care provider. Make sure you discuss any questions you have with your health care provider.

## 2014-04-25 NOTE — Progress Notes (Signed)
   Subjective:    Patient ID: Riley Kim, male    DOB: 06/04/1975, 38 y.o.   MRN: [id number]  HPI Mr. Riley Kim is a 38 yo male with PMHx of HTN, Asthma, Alcohol Abuse, Tobacco Abuse, and Pancreatitis who presents for hospital follow up. Please see problem oriented assessment and plan for more information.  Review of Systems General: Denies fever, chills, fatigue, change in appetite  Respiratory: Denies SOB, cough, wheezing.   Cardiovascular: Denies chest pain and palpitations.  Gastrointestinal: Denies nausea, vomiting, abdominal pain, diarrhea, constipation, and abdominal distention.  Genitourinary: Denies dysuria, urgency Skin: Denies pallor, rash and wounds.  Neurological: Denies dizziness, headaches, weakness, lightheadedness Psychiatric/Behavioral: Denies mood changes, confusion, nervousness, sleep disturbance and agitation.  Past Medical History  Diagnosis Date  . Asthma   . Vertigo   . 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 Arthritis     "right foot, I broke it before"   Outpatient Encounter Prescriptions as of 04/25/2014  Medication Sig Note  . albuterol (PROVENTIL HFA;VENTOLIN HFA) 108 (90 BASE) MCG/ACT inhaler Inhale 2 puffs into the lungs every 6 (six) hours as needed for wheezing or shortness of breath. 04/11/2014: Has not got prescription filled   . folic acid (FOLVITE) 1 MG tablet Take 1 tablet (1 mg total) by mouth daily.   . hydrochlorothiazide (HYDRODIURIL) 25 MG tablet Take 1 tablet (25 mg total) by mouth daily.   . Multiple Vitamin (MULTIVITAMIN WITH MINERALS) TABS tablet Take 1 tablet by mouth daily.   Marland Kitchen. oxyCODONE-acetaminophen (PERCOCET/ROXICET) 5-325 MG per tablet Take 1 tablet by mouth every 4 (four) hours as needed for moderate pain (please offer before morphine).   . thiamine 100 MG tablet Take 1 tablet (100 mg total) by mouth daily.   . [DISCONTINUED] HYDROcodone-acetaminophen (NORCO/VICODIN) 5-325 MG per tablet  Take 1-2 tablets by mouth every 6 (six) hours as needed for moderate pain.   . [DISCONTINUED] Multiple Vitamin (MULTIVITAMIN WITH MINERALS) TABS tablet Take 1 tablet by mouth daily.   . [DISCONTINUED] ondansetron (ZOFRAN) 4 MG tablet Take 1 tablet (4 mg total) by mouth every 6 (six) hours. (Patient not taking: Reported on 04/11/2014)       Objective:   Physical Exam Filed Vitals:   04/25/14 1445  BP: 124/83  Pulse: 75  Temp: 98.2 F (36.8 C)  TempSrc: Oral  Height: 6\' 2"  (1.88 m)  Weight: 225 lb 12.8 oz (102.422 kg)  SpO2: 96%   General: Vital signs reviewed.  Patient is well-developed and well-nourished, in no acute distress and cooperative with exam.  Cardiovascular: RRR, S1 normal, S2 normal, no murmurs, gallops, or rubs. Pulmonary/Chest: Clear to auscultation bilaterally, no wheezes, rales, or rhonchi. Abdominal: Soft, non-tender, non-distended, BS + Musculoskeletal: No joint deformities, erythema, or stiffness, ROM full and nontender. Extremities: No lower extremity edema bilaterally Skin: Warm, dry and intact. No rashes or erythema. Psychiatric: Normal mood and affect. speech and behavior is normal. Cognition and memory are normal.     Assessment & Plan:   Please see problem based assessment and plan.

## 2014-04-26 NOTE — Progress Notes (Signed)
I saw and evaluated the patient.  I personally confirmed the key portions of Dr. Richardson's history and exam and reviewed pertinent patient test results.  The assessment, diagnosis, and plan were formulated together and I agree with the documentation in the resident's note. 

## 2014-06-02 ENCOUNTER — Encounter (HOSPITAL_COMMUNITY): Payer: Self-pay | Admitting: *Deleted

## 2014-06-02 DIAGNOSIS — F1010 Alcohol abuse, uncomplicated: Secondary | ICD-10-CM | POA: Diagnosis present

## 2014-06-02 DIAGNOSIS — I10 Essential (primary) hypertension: Secondary | ICD-10-CM | POA: Diagnosis present

## 2014-06-02 DIAGNOSIS — K852 Alcohol induced acute pancreatitis: Principal | ICD-10-CM | POA: Diagnosis present

## 2014-06-02 DIAGNOSIS — Z880 Allergy status to penicillin: Secondary | ICD-10-CM

## 2014-06-02 DIAGNOSIS — M1990 Unspecified osteoarthritis, unspecified site: Secondary | ICD-10-CM | POA: Diagnosis present

## 2014-06-02 DIAGNOSIS — K860 Alcohol-induced chronic pancreatitis: Secondary | ICD-10-CM | POA: Diagnosis present

## 2014-06-02 DIAGNOSIS — F17210 Nicotine dependence, cigarettes, uncomplicated: Secondary | ICD-10-CM | POA: Diagnosis present

## 2014-06-02 DIAGNOSIS — J45909 Unspecified asthma, uncomplicated: Secondary | ICD-10-CM | POA: Diagnosis present

## 2014-06-02 LAB — CBC WITH DIFFERENTIAL/PLATELET
BASOS ABS: 0.1 10*3/uL (ref 0.0–0.1)
Basophils Relative: 1 % (ref 0–1)
EOS PCT: 2 % (ref 0–5)
Eosinophils Absolute: 0.2 10*3/uL (ref 0.0–0.7)
HEMATOCRIT: 39.7 % (ref 39.0–52.0)
Hemoglobin: 13.3 g/dL (ref 13.0–17.0)
LYMPHS ABS: 2 10*3/uL (ref 0.7–4.0)
Lymphocytes Relative: 18 % (ref 12–46)
MCH: 24.7 pg — AB (ref 26.0–34.0)
MCHC: 33.5 g/dL (ref 30.0–36.0)
MCV: 73.7 fL — ABNORMAL LOW (ref 78.0–100.0)
Monocytes Absolute: 1 10*3/uL (ref 0.1–1.0)
Monocytes Relative: 9 % (ref 3–12)
Neutro Abs: 8.1 10*3/uL — ABNORMAL HIGH (ref 1.7–7.7)
Neutrophils Relative %: 70 % (ref 43–77)
PLATELETS: 190 10*3/uL (ref 150–400)
RBC: 5.39 MIL/uL (ref 4.22–5.81)
RDW: 14.7 % (ref 11.5–15.5)
WBC: 11.6 10*3/uL — ABNORMAL HIGH (ref 4.0–10.5)

## 2014-06-02 LAB — COMPREHENSIVE METABOLIC PANEL
ALT: 62 U/L — ABNORMAL HIGH (ref 0–53)
AST: 178 U/L — ABNORMAL HIGH (ref 0–37)
Albumin: 4.4 g/dL (ref 3.5–5.2)
Alkaline Phosphatase: 60 U/L (ref 39–117)
Anion gap: 14 (ref 5–15)
BILIRUBIN TOTAL: 1.8 mg/dL — AB (ref 0.3–1.2)
BUN: 9 mg/dL (ref 6–23)
CO2: 24 mmol/L (ref 19–32)
Calcium: 9.6 mg/dL (ref 8.4–10.5)
Chloride: 95 mmol/L — ABNORMAL LOW (ref 96–112)
Creatinine, Ser: 0.73 mg/dL (ref 0.50–1.35)
GFR calc Af Amer: >90 mL/min (ref >90)
GFR calc non Af Amer: >90 mL/min (ref >90)
Glucose, Bld: 97 mg/dL (ref 70–99)
Potassium: 3.5 mmol/L (ref 3.5–5.1)
Sodium: 133 mmol/L — ABNORMAL LOW (ref 135–145)
Total Protein: 8 g/dL (ref 6.0–8.3)

## 2014-06-02 LAB — LIPASE, BLOOD: LIPASE: 126 U/L — AB (ref 11–59)

## 2014-06-02 NOTE — ED Notes (Signed)
Pt in c/o abd pain for the last day, states this feels like his pancreatitis, denies vomiting but reports nausea, no distress noted

## 2014-06-03 ENCOUNTER — Encounter (HOSPITAL_COMMUNITY): Payer: Self-pay | Admitting: *Deleted

## 2014-06-03 ENCOUNTER — Inpatient Hospital Stay (HOSPITAL_COMMUNITY)
Admission: EM | Admit: 2014-06-03 | Discharge: 2014-06-03 | DRG: 440 | Disposition: A | Discharge status: Home / Self Care | Payer: Self-pay | Attending: Internal Medicine | Admitting: Internal Medicine

## 2014-06-03 DIAGNOSIS — F1010 Alcohol abuse, uncomplicated: Secondary | ICD-10-CM | POA: Diagnosis present

## 2014-06-03 DIAGNOSIS — Z720 Tobacco use: Secondary | ICD-10-CM | POA: Insufficient documentation

## 2014-06-03 DIAGNOSIS — Z79899 Other long term (current) drug therapy: Secondary | ICD-10-CM | POA: Insufficient documentation

## 2014-06-03 DIAGNOSIS — Z880 Allergy status to penicillin: Secondary | ICD-10-CM | POA: Insufficient documentation

## 2014-06-03 DIAGNOSIS — J45909 Unspecified asthma, uncomplicated: Secondary | ICD-10-CM | POA: Insufficient documentation

## 2014-06-03 DIAGNOSIS — K852 Alcohol induced acute pancreatitis without necrosis or infection: Secondary | ICD-10-CM | POA: Diagnosis present

## 2014-06-03 DIAGNOSIS — R17 Unspecified jaundice: Secondary | ICD-10-CM | POA: Insufficient documentation

## 2014-06-03 DIAGNOSIS — R740 Nonspecific elevation of levels of transaminase and lactic acid dehydrogenase [LDH]: Secondary | ICD-10-CM

## 2014-06-03 DIAGNOSIS — R7401 Elevation of levels of liver transaminase levels: Secondary | ICD-10-CM | POA: Insufficient documentation

## 2014-06-03 DIAGNOSIS — I10 Essential (primary) hypertension: Secondary | ICD-10-CM | POA: Diagnosis present

## 2014-06-03 DIAGNOSIS — M1990 Unspecified osteoarthritis, unspecified site: Secondary | ICD-10-CM | POA: Insufficient documentation

## 2014-06-03 LAB — COMPREHENSIVE METABOLIC PANEL
ALBUMIN: 4 g/dL (ref 3.5–5.2)
ALK PHOS: 88 U/L (ref 39–117)
ALT: 77 U/L — ABNORMAL HIGH (ref 0–53)
ANION GAP: 13 (ref 5–15)
AST: 256 U/L — AB (ref 0–37)
BILIRUBIN TOTAL: 1.6 mg/dL — AB (ref 0.3–1.2)
BUN: 10 mg/dL (ref 6–23)
CALCIUM: 9 mg/dL (ref 8.4–10.5)
CO2: 23 mmol/L (ref 19–32)
CREATININE: 0.81 mg/dL (ref 0.50–1.35)
Chloride: 96 mmol/L (ref 96–112)
GFR calc Af Amer: >90 mL/min (ref >90)
GLUCOSE: 84 mg/dL (ref 70–99)
POTASSIUM: 3.5 mmol/L (ref 3.5–5.1)
SODIUM: 132 mmol/L — AB (ref 135–145)
Total Protein: 7.3 g/dL (ref 6.0–8.3)

## 2014-06-03 LAB — CBC
HCT: 37.5 % — ABNORMAL LOW (ref 39.0–52.0)
Hemoglobin: 12.3 g/dL — ABNORMAL LOW (ref 13.0–17.0)
MCH: 24.8 pg — ABNORMAL LOW (ref 26.0–34.0)
MCHC: 32.8 g/dL (ref 30.0–36.0)
MCV: 75.6 fL — ABNORMAL LOW (ref 78.0–100.0)
Platelets: 181 10*3/uL (ref 150–400)
RBC: 4.96 MIL/uL (ref 4.22–5.81)
RDW: 14.7 % (ref 11.5–15.5)
WBC: 6.1 10*3/uL (ref 4.0–10.5)

## 2014-06-03 LAB — ETHANOL: Alcohol, Ethyl (B): <5 mg/dL (ref 0–9)

## 2014-06-03 MED ORDER — NICOTINE 14 MG/24HR TD PT24
14.0000 mg | MEDICATED_PATCH | Freq: Every day | TRANSDERMAL | Status: DC
  Administered 2014-06-03: 14 mg via TRANSDERMAL
  Filled 2014-06-03: qty 1

## 2014-06-03 MED ORDER — ONDANSETRON HCL 4 MG/2ML IJ SOLN: 4.0000 mg | Freq: Four times a day (QID) | INTRAMUSCULAR | Status: DC | PRN

## 2014-06-03 MED ORDER — SODIUM CHLORIDE 0.9 % IV SOLN
INTRAVENOUS | Status: DC
  Administered 2014-06-03: 05:00:00 via INTRAVENOUS

## 2014-06-03 MED ORDER — LORAZEPAM 2 MG/ML IJ SOLN
0.0000 mg | Freq: Two times a day (BID) | INTRAMUSCULAR | Status: DC
Start: 2014-06-05 — End: 2014-06-03

## 2014-06-03 MED ORDER — THIAMINE HCL 100 MG/ML IJ SOLN: 100.0000 mg | Freq: Every day | INTRAMUSCULAR | Status: DC

## 2014-06-03 MED ORDER — LORAZEPAM 2 MG/ML IJ SOLN
0.0000 mg | Freq: Four times a day (QID) | INTRAMUSCULAR | Status: DC
  Administered 2014-06-03: 2 mg via INTRAVENOUS
  Filled 2014-06-03: qty 1

## 2014-06-03 MED ORDER — ADULT MULTIVITAMIN W/MINERALS CH
1.0000 | ORAL_TABLET | Freq: Every day | ORAL | Status: DC
  Administered 2014-06-03: 1 via ORAL
  Filled 2014-06-03: qty 1

## 2014-06-03 MED ORDER — HYDROMORPHONE HCL 1 MG/ML IJ SOLN
1.0000 mg | Freq: Once | INTRAMUSCULAR | Status: AC
  Administered 2014-06-03: 1 mg via INTRAVENOUS
  Filled 2014-06-03: qty 1

## 2014-06-03 MED ORDER — SODIUM CHLORIDE 0.9 % IV SOLN
1000.0000 mL | Freq: Once | INTRAVENOUS | Status: AC
  Administered 2014-06-03: 1000 mL via INTRAVENOUS

## 2014-06-03 MED ORDER — ONDANSETRON HCL 4 MG PO TABS: 4.0000 mg | ORAL_TABLET | Freq: Three times a day (TID) | ORAL | Status: DC | PRN

## 2014-06-03 MED ORDER — ONDANSETRON HCL 4 MG PO TABS: 4.0000 mg | ORAL_TABLET | Freq: Four times a day (QID) | ORAL | Status: DC | PRN

## 2014-06-03 MED ORDER — OXYCODONE-ACETAMINOPHEN 5-325 MG PO TABS
2.0000 | ORAL_TABLET | ORAL | Status: DC | PRN
  Administered 2014-06-03: 2 via ORAL
  Filled 2014-06-03: qty 2

## 2014-06-03 MED ORDER — SODIUM CHLORIDE 0.9 % IV SOLN
1000.0000 mL | INTRAVENOUS | Status: DC
  Administered 2014-06-03: 1000 mL via INTRAVENOUS

## 2014-06-03 MED ORDER — HEPARIN SODIUM (PORCINE) 5000 UNIT/ML IJ SOLN: 5000.0000 [IU] | Freq: Three times a day (TID) | INTRAMUSCULAR | Status: DC

## 2014-06-03 MED ORDER — DIPHENHYDRAMINE HCL 50 MG/ML IJ SOLN
12.5000 mg | Freq: Once | INTRAMUSCULAR | Status: AC
  Administered 2014-06-03: 12.5 mg via INTRAVENOUS
  Filled 2014-06-03: qty 1

## 2014-06-03 MED ORDER — LORAZEPAM 1 MG PO TABS: 1.0000 mg | ORAL_TABLET | Freq: Four times a day (QID) | ORAL | Status: DC | PRN

## 2014-06-03 MED ORDER — OXYCODONE-ACETAMINOPHEN 5-325 MG PO TABS: 1.0000 | ORAL_TABLET | Freq: Four times a day (QID) | ORAL | Status: DC | PRN

## 2014-06-03 MED ORDER — PANTOPRAZOLE SODIUM 40 MG IV SOLR
40.0000 mg | Freq: Once | INTRAVENOUS | Status: AC
  Administered 2014-06-03: 40 mg via INTRAVENOUS
  Filled 2014-06-03: qty 40

## 2014-06-03 MED ORDER — HYDROMORPHONE HCL 1 MG/ML IJ SOLN
1.0000 mg | INTRAMUSCULAR | Status: DC | PRN
  Administered 2014-06-03: 1 mg via INTRAVENOUS
  Filled 2014-06-03: qty 1

## 2014-06-03 MED ORDER — ONDANSETRON HCL 4 MG/2ML IJ SOLN
4.0000 mg | Freq: Once | INTRAMUSCULAR | Status: AC
  Administered 2014-06-03: 4 mg via INTRAVENOUS
  Filled 2014-06-03: qty 2

## 2014-06-03 MED ORDER — LORAZEPAM 2 MG/ML IJ SOLN: 1.0000 mg | Freq: Four times a day (QID) | INTRAMUSCULAR | Status: DC | PRN

## 2014-06-03 MED ORDER — VITAMIN B-1 100 MG PO TABS
100.0000 mg | ORAL_TABLET | Freq: Every day | ORAL | Status: DC
  Administered 2014-06-03: 100 mg via ORAL
  Filled 2014-06-03: qty 1

## 2014-06-03 MED ORDER — FOLIC ACID 1 MG PO TABS
1.0000 mg | ORAL_TABLET | Freq: Every day | ORAL | Status: DC
  Administered 2014-06-03: 1 mg via ORAL
  Filled 2014-06-03: qty 1

## 2014-06-03 MED ORDER — ALBUTEROL SULFATE (2.5 MG/3ML) 0.083% IN NEBU: 3.0000 mL | INHALATION_SOLUTION | Freq: Four times a day (QID) | RESPIRATORY_TRACT | Status: DC | PRN

## 2014-06-03 NOTE — ED Notes (Signed)
No answer x3

## 2014-06-03 NOTE — Progress Notes (Signed)
IV removed per order. Discharge instructions and prescription given and explained with teachback. Wishes to eat dinner before discharge. Trina Aoarla Malani Lees, RN

## 2014-06-03 NOTE — Progress Notes (Addendum)
Subjective: No complaints today. Feels much better. No nausea. Ready to eat. No abdominal pain.   Objective: Vital signs in last 24 hours: Filed Vitals:   06/03/14 0300 06/03/14 0415 06/03/14 0542 06/03/14 0810  BP: 123/74 112/65 120/77 122/78  Pulse: 81 80 75 76  Temp:   97.8 F (36.6 C) 97.6 F (36.4 C)  TempSrc:   Oral Oral  Resp:   17 20  Height:   6' 2.88" (1.902 m)   Weight:   228 lb 9.6 oz (103.692 kg)   SpO2: 86% 91% 99% 99%   Weight change:   Intake/Output Summary (Last 24 hours) at 06/03/14 1316 Last data filed at 06/03/14 0649  Gross per 24 hour  Intake 253.75 ml  Output      0 ml  Net 253.75 ml   General appearance: alert, cooperative, appears stated age and no distress, moist mucus membranes. Head: Normocephalic, without obvious abnormality, atraumatic Lungs: clear to auscultation bilaterally Heart: regular rate and rhythm, S1, S2 normal, no murmur, click, rub or gallop Abdomen: soft, non-tender; bowel sounds normal; no masses,  no organomegaly Extremities: extremities normal, atraumatic, no cyanosis or edema  Lab Results: Basic Metabolic Panel:  Recent Labs Lab 06/02/14 2241 06/03/14 0535  NA 133* 132*  K 3.5 3.5  CL 95* 96  CO2 24 23  GLUCOSE 97 84  BUN 9 10  CREATININE 0.73 0.81  CALCIUM 9.6 9.0   Liver Function Tests:  Recent Labs Lab 06/02/14 2241 06/03/14 0535  AST 178* 256*  ALT 62* 77*  ALKPHOS 60 88  BILITOT 1.8* 1.6*  PROT 8.0 7.3  ALBUMIN 4.4 4.0    Recent Labs Lab 06/02/14 2241  LIPASE 126*   CBC:  Recent Labs Lab 06/02/14 2241 06/03/14 0535  WBC 11.6* 6.1  NEUTROABS 8.1*  --   HGB 13.3 12.3*  HCT 39.7 37.5*  MCV 73.7* 75.6*  PLT 190 181   Urine Drug Screen: Drugs of Abuse     Component Value Date/Time   LABOPIA POSITIVE* 12/29/2013 1050   COCAINSCRNUR NONE DETECTED 12/29/2013 1050   LABBENZ NONE DETECTED 12/29/2013 1050   AMPHETMU NONE DETECTED 12/29/2013 1050   THCU NONE DETECTED 12/29/2013 1050     LABBARB NONE DETECTED 12/29/2013 1050    Alcohol Level:  Recent Labs Lab 06/03/14 0535  ETH <5   Medications: I have reviewed the patient's current medications. Scheduled Meds: . diphenhydrAMINE  12.5 mg Intravenous Once  . folic acid  1 mg Oral Daily  . heparin  5,000 Units Subcutaneous 3 times per day  . LORazepam  0-4 mg Intravenous Q6H   Followed by  . [START ON 06/05/2014] LORazepam  0-4 mg Intravenous Q12H  . multivitamin with minerals  1 tablet Oral Daily  . nicotine  14 mg Transdermal Daily  . thiamine  100 mg Oral Daily   Or  . thiamine  100 mg Intravenous Daily   Continuous Infusions: . sodium chloride Stopped (06/03/14 0440)  . sodium chloride 175 mL/hr at 06/03/14 0522   PRN Meds:.albuterol, LORazepam **OR** LORazepam, ondansetron **OR** ondansetron (ZOFRAN) IV, oxyCODONE-acetaminophen Assessment/Plan: Principal Problem:   Acute alcoholic pancreatitis Active Problems:   Alcohol abuse   Essential hypertension   Pancreatitis, alcoholic, acute   #Pancreatitis, likely alcohol induced: Improved pain, no nausea or vomiting, ready to eat. - Clears to start and advance diet as tolerated. - Reduce rate N/s to100 cc/hr - D/c Dilaudid 1 mg iv q4hprn, switch to PO percocet- 5-325 1-2  tabs Q4H - Zofran -CIWA protocol -social work consulted to give pt infor about Alcohol cessation. - discharge   #EtOH abuse: Drinks 1/2 pint per day. Pt interesting in rehab and is requesting to be sent to a rehab facility after discharge.  -social work consult  #HTN: H/o HTN. On HCTZ 25mg  daily. BP well controlled. Will continue home regimen. - hold Bp meds for now.  #Asthma: Non-contributory. At home he is on albuterol 2 puff q6hprn but has not needed it in awhile. -albuterol 2 puff q6hprn  #Diet: Advance diet  #DVT PPx: heparin 5000 u [place] tid.  #Code: full  Dispo: Disposition is deferred at this time, awaiting improvement of current medical problems.  Anticipated  discharge today.    LOS: 0 days   Onnie BoerEjiroghene E Hareem Surowiec, MD 06/03/2014, 1:16 PM

## 2014-06-03 NOTE — ED Notes (Addendum)
Pt states sharp pain in stomach and back since yesterday, thinks he may have pancreatitis, last flare up was in December. Also states he hasn't had a bm since Wednesday, had very dark stool last week. Pt reports vomiting when he was stuck for blood here, but denies any other episodes, also c/o some sob. Pt alert, oriented. Respirations equal and unlabored, nad.

## 2014-06-03 NOTE — Progress Notes (Signed)
Received patient from ED at 0515.  Patient AOx4, VS stable, pain=2.

## 2014-06-03 NOTE — ED Notes (Signed)
Admitting MD at bedside.

## 2014-06-03 NOTE — H&P (Signed)
Date: 06/03/2014               Patient Name:  Riley Kim MRN: [id number]  DOB: January 30, 1976 Age / Sex: 39 y.o., male   PCP: Jill Alexanders, MD         Medical Service: Internal Medicine Teaching Service         Attending Physician: Dr. Earl Lagos, MD    First Contact: Dr Heywood Iles Pager: [phone]  Second Contact: Dr. Inocente Salles Pager: [phone]       After Hours (After 5p/  First Contact Pager: [phone]  weekends / holidays): Second Contact Pager: [phone]   Chief Complaint: Abdominal pain  History of Present Illness:  39yo M w/ PMH EtOH use and recurrent pancreatitis presents with abdominal pain. He was in his usual state of health until Thursday 06/01/14 when he had onset of sharp epigastric abdominal pain that radiates to his back. He said this began shortly after eating some tacos. The pain comes and goes. There has been some nausea but no emesis. He says this feels like his previous episodes of pancreatitis. His last drink was a couple of shots of liquor Thursday night after the abdominal pain had begun. He says he usually drinks about half a pint of liquor such as gin once a day. He denies any drug use. He has not eaten in the past day. He has a history of recurrent pancreatitis with three similar admission in the past six months.  In the ED, he received dilaudid 1 mg iv, zofran 4 mg iv, and protonix 40 mg iv as well as a 1 L NS and a NS infusion at 125 cc/hr. He was noted to have  Meds: Current Facility-Administered Medications  Medication Dose Route Frequency Provider Last Rate Last Dose  . 0.9 %  sodium chloride infusion  1,000 mL Intravenous Continuous Dione Booze, MD 125 mL/hr at 06/03/14 0321 1,000 mL at 06/03/14 0321   Current Outpatient Prescriptions  Medication Sig Dispense Refill  . folic acid (FOLVITE) 1 MG tablet Take 1 tablet (1 mg total) by mouth daily. 30 tablet 1  . hydrochlorothiazide (HYDRODIURIL) 25 MG tablet Take 1 tablet (25 mg total) by mouth  daily. 30 tablet 3  . HYDROcodone-acetaminophen (NORCO) 10-325 MG per tablet Take 1 tablet by mouth every 6 (six) hours as needed for moderate pain.    . Multiple Vitamin (MULTIVITAMIN WITH MINERALS) TABS tablet Take 1 tablet by mouth daily. 90 tablet 0  . thiamine 100 MG tablet Take 1 tablet (100 mg total) by mouth daily. 30 tablet 1  . albuterol (PROVENTIL HFA;VENTOLIN HFA) 108 (90 BASE) MCG/ACT inhaler Inhale 2 puffs into the lungs every 6 (six) hours as needed for wheezing or shortness of breath. (Patient not taking: Reported on 06/03/2014) 1 Inhaler 2  . oxyCODONE-acetaminophen (PERCOCET/ROXICET) 5-325 MG per tablet Take 1 tablet by mouth every 4 (four) hours as needed for moderate pain (please offer before morphine). (Patient not taking: Reported on 06/03/2014) 30 tablet 0  . [DISCONTINUED] lisinopril (PRINIVIL,ZESTRIL) 10 MG tablet Take 1 tablet (10 mg total) by mouth daily. 30 tablet 3    Allergies: Allergies as of 06/02/2014 - Review Complete 06/02/2014  Allergen Reaction Noted  . Penicillins Other (See Comments) 09/21/2012  . Lisinopril Swelling 01/18/2014   Past Medical History  Diagnosis Date  . Asthma   . Vertigo   . 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 Arthritis     "  right foot, I broke it before"   Past Surgical History  Procedure Laterality Date  . Replantation thumb Right 2000   History reviewed. No pertinent family history. History   Social History  . Marital Status: Married    Spouse Name: N/A    Number of Children: N/A  . Years of Education: N/A   Occupational History  .  [employer]   Social History Main Topics  . Smoking status: Current Every Day Smoker -- 1.00 packs/day for 20 years    Types: Cigarettes  . Smokeless tobacco: Never Used     Comment: cutting back  . Alcohol Use: 4.8 oz/week    7 Cans of beer, 1 Shots of liquor per week  . Drug Use: No     Comment: "last marijuana was ~ 2012" (12/29/2013)    . Sexual Activity: Not on file   Other Topics Concern  . Not on file   Social History Narrative    Review of Systems: A comprehensive review of systems was negative except for: as noted above per HPI  Physical Exam: Blood pressure 123/74, pulse 81, temperature 98.5 F (36.9 C), temperature source Oral, resp. rate 12, SpO2 86 %.  Gen: A&O x 4, no acute distress, well developed, well nourished HEENT: Atraumatic, PERRL, EOMI, sclerae anicteric, moist mucous membranes Heart: Regular rate and rhythm, normal S1 S2, no murmurs, rubs, or gallops Lungs: Clear to auscultation bilaterally, respirations unlabored Abd: Soft, non-tender, non-distended, + bowel sounds, no hepatosplenomegaly Ext: No edema or cyanosis  Lab results: Basic Metabolic Panel:  Recent Labs  45/40/9801/29/16 2241  NA 133*  K 3.5  CL 95*  CO2 24  GLUCOSE 97  BUN 9  CREATININE 0.73  CALCIUM 9.6   Liver Function Tests:  Recent Labs  06/02/14 2241  AST 178*  ALT 62*  ALKPHOS 60  BILITOT 1.8*  PROT 8.0  ALBUMIN 4.4    Recent Labs  06/02/14 2241  LIPASE 126*   CBC:  Recent Labs  06/02/14 2241  WBC 11.6*  NEUTROABS 8.1*  HGB 13.3  HCT 39.7  MCV 73.7*  PLT 190    Imaging results:  No results found.  Other results: EKG: none in ED  Assessment & Plan by Problem: 39yo M w/ PMH EtOH use and recurrent pancreatitis presents with pancreatitis.  #Pancreatitis, likely alcohol induced: Pt with abd pain w/o vomiting x1 day after eating. Pt drinks approx 1/2 pint per day of liquor with last drink a couple of shots Thursday evening. He appeared comfortable on exam and said this was because the dilaudid he received helped. His lipase is 126- the best it has been since 2010 although it is only measured when he is here for pancreatitis. AST 178/ALT 62. Previously 113 and 52 respectively in 04/2014 on last alcohol admission. 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 Last CT abd  pelvis 12/2013 with pancreas stranding and homogeneously enhancing as well as unremarkable gallbladder. - NPO - NS @ 175 cc/hr - Dilaudid 1 mg iv q4hprn - Zofran -check EtOH -repeat CMP, CBC in am -CIWA protocol -MVI -folic acid 1 mg po daily -thiamine 100 mg po or iv daily -social work consult  #EtOH abuse: Drinks 1/2 pint per day. Pt interesting in rehab and is requesting to be sent to a rehab facility after discharge.  -CIWA protocol -MVI -folic acid 1 mg po daily -thiamine 100 mg po or iv daily -social work consult  #HTN: H/o HTN. On  HCTZ  daily. BP well controlled. Will continue home regimen. - HCTZ  daily   #Asthma: Non-contributory. At home he is on albuterol 2 puff q6hprn but has not needed it in awhile. -albuterol 2 puff q6hprn  #Diet: NPO  #DVT PPx: heparin 5000 u [place] tid  #Code: full   Dispo: Disposition is deferred at this time, awaiting improvement of current medical problems. Anticipated discharge in approximately 0-3 day(s).   The patient does have a current PCP (Jill Alexanders, MD) and does need an [facility] hospital follow-up appointment after discharge.  The patient does not have transportation limitations that hinder transportation to clinic appointments.  Signed: Lorenda Hatchet, MD 06/03/2014, 3:51 AM

## 2014-06-03 NOTE — ED Provider Notes (Signed)
CSN: [id number]     Arrival date & time 06/02/14  2217 History  This chart was scribed for Dione Booze, MD by Bronson Curb, ED Scribe. This patient was seen in room A06C/A06C and the patient's care was started at 2:15 AM.   Chief Complaint  Patient presents with  . Abdominal Pain    The history is provided by the patient. No language interpreter was used.     HPI Comments: Riley Kim is a 39 y.o. male, with history of pancreatitis, who presents to the Emergency Department complaining of 8/10, throbbing, burning, generalized abdominal pain began yesterday after eating. Patient suspects the pain is related to a pancreatitis flare-up, and has been seen 4 times in the last 6 months for the same. There is associated back pain, nausea, and 1 episode of vomiting here after he was after blood was drawn for labs. Patient has tried hydrocodone without significant improvement and states he has not eaten since onset of pain. He denies fever, chills. He reports taking 2 shots of EtOH PTA in an attempt to sleep, however, he denies any EtOH consumption prior to onset of pain.   Past Medical History  Diagnosis Date  . Asthma   . Vertigo   . 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 Arthritis     "right foot, I broke it before"   Past Surgical History  Procedure Laterality Date  . Replantation thumb Right 2000   History reviewed. No pertinent family history. History  Substance Use Topics  . Smoking status: Current Every Day Smoker -- 1.00 packs/day for 20 years    Types: Cigarettes  . Smokeless tobacco: Never Used     Comment: cutting back  . Alcohol Use: 4.8 oz/week    7 Cans of beer, 1 Shots of liquor per week    Review of Systems  Constitutional: Negative for fever and chills.  Gastrointestinal: Positive for nausea, vomiting and abdominal pain.  Musculoskeletal: Positive for back pain.  All other systems reviewed and are  negative.     Allergies  Penicillins and Lisinopril  Home Medications   Prior to Admission medications   Medication Sig Start Date End Date Taking? Authorizing Provider  albuterol (PROVENTIL HFA;VENTOLIN HFA) 108 (90 BASE) MCG/ACT inhaler Inhale 2 puffs into the lungs every 6 (six) hours as needed for wheezing or shortness of breath. 01/05/14   Ky Barban, MD  folic acid (FOLVITE) 1 MG tablet Take 1 tablet (1 mg total) by mouth daily. 03/08/14   Ejiroghene Wendall Stade, MD  hydrochlorothiazide (HYDRODIURIL) 25 MG tablet Take 1 tablet (25 mg total) by mouth daily. 03/05/14   Raeford Razor, MD  Multiple Vitamin (MULTIVITAMIN WITH MINERALS) TABS tablet Take 1 tablet by mouth daily. 04/13/14   Harold Barban, MD  oxyCODONE-acetaminophen (PERCOCET/ROXICET) 5-325 MG per tablet Take 1 tablet by mouth every 4 (four) hours as needed for moderate pain (please offer before morphine). 04/13/14   Harold Barban, MD  thiamine 100 MG tablet Take 1 tablet (100 mg total) by mouth daily. 03/08/14   Ejiroghene Wendall Stade, MD   Triage Vitals: BP 128/89 mmHg  Pulse 85  Temp(Src) 98.5 F (36.9 C) (Oral)  Resp 18  SpO2 98%  Physical Exam  Constitutional: He is oriented to person, place, and time. He appears well-developed and well-nourished. No distress.  HENT:  Head: Normocephalic and atraumatic.  Eyes: Conjunctivae and EOM are normal. Pupils are equal, round, and reactive  to light. No scleral icterus.  Neck: Normal range of motion. Neck supple. No JVD present.  Cardiovascular: Normal rate, regular rhythm and normal heart sounds.   No murmur heard. Pulmonary/Chest: Effort normal and breath sounds normal. He has no wheezes. He has no rales. He exhibits no tenderness.  Abdominal: Soft. He exhibits no distension and no mass. Bowel sounds are decreased. There is tenderness. There is guarding. There is no rebound.  Moderate to severe tenderness across upper abdomen with voluntary guarding.   Musculoskeletal:  Normal range of motion. He exhibits no edema.  Lymphadenopathy:    He has no cervical adenopathy.  Neurological: He is alert and oriented to person, place, and time. He has normal reflexes. No cranial nerve deficit. Coordination normal.  Skin: Skin is warm and dry. No rash noted.  Psychiatric: He has a normal mood and affect. His behavior is normal. Judgment and thought content normal.  Nursing note and vitals reviewed.   ED Course  Procedures (including critical care time)  DIAGNOSTIC STUDIES: Oxygen Saturation is 98% on room air, normal by my interpretation.    COORDINATION OF CARE: At 0220 Discussed treatment plan with patient which includes pain medication. Patient agrees.   Labs Review Results for orders placed or performed during the hospital encounter of 06/03/14  CBC WITH DIFFERENTIAL  Result Value Ref Range   WBC 11.6 (H) 4.0 - 10.5 K/uL   RBC 5.39 4.22 - 5.81 MIL/uL   Hemoglobin 13.3 13.0 - 17.0 g/dL   HCT 16.1 09.6 - 04.5 %   MCV 73.7 (L) 78.0 - 100.0 fL   MCH 24.7 (L) 26.0 - 34.0 pg   MCHC 33.5 30.0 - 36.0 g/dL   RDW 40.9 81.1 - 91.4 %   Platelets 190 150 - 400 K/uL   Neutrophils Relative % 70 43 - 77 %   Neutro Abs 8.1 (H) 1.7 - 7.7 K/uL   Lymphocytes Relative 18 12 - 46 %   Lymphs Abs 2.0 0.7 - 4.0 K/uL   Monocytes Relative 9 3 - 12 %   Monocytes Absolute 1.0 0.1 - 1.0 K/uL   Eosinophils Relative 2 0 - 5 %   Eosinophils Absolute 0.2 0.0 - 0.7 K/uL   Basophils Relative 1 0 - 1 %   Basophils Absolute 0.1 0.0 - 0.1 K/uL  Comprehensive metabolic panel  Result Value Ref Range   Sodium 133 (L) 135 - 145 mmol/L   Potassium 3.5 3.5 - 5.1 mmol/L   Chloride 95 (L) 96 - 112 mmol/L   CO2 24 19 - 32 mmol/L   Glucose, Bld 97 70 - 99 mg/dL   BUN 9 6 - 23 mg/dL   Creatinine, Ser 7.82 0.50 - 1.35 mg/dL   Calcium 9.6 8.4 - 95.6 mg/dL   Total Protein 8.0 6.0 - 8.3 g/dL   Albumin 4.4 3.5 - 5.2 g/dL   AST 213 (H) 0 - 37 U/L   ALT 62 (H) 0 - 53 U/L   Alkaline  Phosphatase 60 39 - 117 U/L   Total Bilirubin 1.8 (H) 0.3 - 1.2 mg/dL   GFR calc non Af Amer >90 >90 mL/min   GFR calc Af Amer >90 >90 mL/min   Anion gap 14 5 - 15  Lipase, blood  Result Value Ref Range   Lipase 126 (H) 11 - 59 U/L   MDM   Final diagnoses:  Acute alcoholic pancreatitis  Elevated transaminase level  Elevated bilirubin    Abdominal pain consistent with recurrent  pancreatitis. Old records are reviewed and he has had 3 hospital admissions for alcoholic pancreatitis in the last 6 months. He continues to drink half pint of liquor a day. Pain has not been able to be controlled at home with hydrocodone so he will need to be admitted. Lipase is moderately elevated at 126. This is less than it has been elevated in the past and may be related to burnout of pancreas. Mild elevation of transaminases and bilirubin are present consistent with alcohol-induced injury. Case is discussed with Dr. Tinnie Gensossman of internal medicine service who agrees to admit the patient.  I personally performed the services described in this documentation, which was scribed in my presence. The recorded information has been reviewed and is accurate.     Dione Boozeavid Meiko Ives, MD 06/03/14 [phone]

## 2014-06-04 ENCOUNTER — Emergency Department (HOSPITAL_COMMUNITY)
Admission: EM | Admit: 2014-06-04 | Discharge: 2014-06-04 | Disposition: A | Discharge status: Home / Self Care | Payer: Self-pay | Attending: Emergency Medicine | Admitting: Emergency Medicine

## 2014-06-04 ENCOUNTER — Encounter (HOSPITAL_COMMUNITY): Payer: Self-pay | Admitting: *Deleted

## 2014-06-04 DIAGNOSIS — K852 Alcohol induced acute pancreatitis without necrosis or infection: Secondary | ICD-10-CM

## 2014-06-04 LAB — LIPASE, BLOOD: Lipase: 309 U/L — ABNORMAL HIGH (ref 11–59)

## 2014-06-04 MED ORDER — HYDROCODONE-ACETAMINOPHEN 5-325 MG PO TABS: 1.0000 | ORAL_TABLET | Freq: Four times a day (QID) | ORAL | Status: DC | PRN

## 2014-06-04 MED ORDER — LORAZEPAM 2 MG/ML IJ SOLN: 2.0000 mg | Freq: Once | INTRAMUSCULAR | Status: DC

## 2014-06-04 MED ORDER — PROMETHAZINE HCL 25 MG RE SUPP: 25.0000 mg | Freq: Four times a day (QID) | RECTAL | Status: DC | PRN

## 2014-06-04 MED ORDER — SODIUM CHLORIDE 0.9 % IV BOLUS (SEPSIS)
1000.0000 mL | Freq: Once | INTRAVENOUS | Status: AC
Start: 2014-06-04 — End: 2014-06-04
  Administered 2014-06-04: 1000 mL via INTRAVENOUS

## 2014-06-04 MED ORDER — FAMOTIDINE IN NACL 20-0.9 MG/50ML-% IV SOLN
20.0000 mg | Freq: Once | INTRAVENOUS | Status: AC
  Administered 2014-06-04: 20 mg via INTRAVENOUS
  Filled 2014-06-04: qty 50

## 2014-06-04 MED ORDER — SODIUM CHLORIDE 0.9 % IV BOLUS (SEPSIS): 1000.0000 mL | Freq: Once | INTRAVENOUS | Status: DC

## 2014-06-04 MED ORDER — HYDROMORPHONE HCL 1 MG/ML IJ SOLN
1.0000 mg | Freq: Once | INTRAMUSCULAR | Status: AC
  Administered 2014-06-04: 1 mg via INTRAVENOUS
  Filled 2014-06-04: qty 1

## 2014-06-04 MED ORDER — ONDANSETRON 8 MG PO TBDP: 8.0000 mg | ORAL_TABLET | Freq: Three times a day (TID) | ORAL | Status: DC | PRN

## 2014-06-04 MED ORDER — HYDROMORPHONE HCL 1 MG/ML IJ SOLN
2.0000 mg | Freq: Once | INTRAMUSCULAR | Status: AC
  Administered 2014-06-04: 2 mg via INTRAVENOUS
  Filled 2014-06-04: qty 2

## 2014-06-04 NOTE — ED Notes (Signed)
The pt just left the ed 2-3 hours ago with opancreatitius.  He is now reporting that his pain is wirse

## 2014-06-04 NOTE — Discharge Instructions (Signed)
Please start with liquid diet, and advance to soft diet as tolerated. Take the nausea meds prescribed. STAY AWAY FROM ALCOHOL.   Acute Pancreatitis Acute pancreatitis is a disease in which the pancreas becomes suddenly inflamed. The pancreas is a large gland located behind your stomach. The pancreas produces enzymes that help digest food. The pancreas also releases the hormones glucagon and insulin that help regulate blood sugar. Damage to the pancreas occurs when the digestive enzymes from the pancreas are activated and begin attacking the pancreas before being released into the intestine. Most acute attacks last a couple of days and can cause serious complications. Some people become dehydrated and develop low blood pressure. In severe cases, bleeding into the pancreas can lead to shock and can be life-threatening. The lungs, heart, and kidneys may fail. CAUSES  Pancreatitis can happen to anyone. In some cases, the cause is unknown. Most cases are caused by:  Alcohol abuse.  Gallstones. Other less common causes are:  Certain medicines.  Exposure to certain chemicals.  Infection.  Damage caused by an accident (trauma).  Abdominal surgery. SYMPTOMS   Pain in the upper abdomen that may radiate to the back.  Tenderness and swelling of the abdomen.  Nausea and vomiting. DIAGNOSIS  Your caregiver will perform a physical exam. Blood and stool tests may be done to confirm the diagnosis. Imaging tests may also be done, such as X-rays, CT scans, or an ultrasound of the abdomen. TREATMENT  Treatment usually requires a stay in the hospital. Treatment may include:  Pain medicine.  Fluid replacement through an intravenous line (IV).  Placing a tube in the stomach to remove stomach contents and control vomiting.  Not eating for 3 or 4 days. This gives your pancreas a rest, because enzymes are not being produced that can cause further damage.  Antibiotic medicines if your condition is  caused by an infection.  Surgery of the pancreas or gallbladder. HOME CARE INSTRUCTIONS   Follow the diet advised by your caregiver. This may involve avoiding alcohol and decreasing the amount of fat in your diet.  Eat smaller, more frequent meals. This reduces the amount of digestive juices the pancreas produces.  Drink enough fluids to keep your urine clear or pale yellow.  Only take over-the-counter or prescription medicines as directed by your caregiver.  Avoid drinking alcohol if it caused your condition.  Do not smoke.  Get plenty of rest.  Check your blood sugar at home as directed by your caregiver.  Keep all follow-up appointments as directed by your caregiver. SEEK MEDICAL CARE IF:   You do not recover as quickly as expected.  You develop new or worsening symptoms.  You have persistent pain, weakness, or nausea.  You recover and then have another episode of pain. SEEK IMMEDIATE MEDICAL CARE IF:   You are unable to eat or keep fluids down.  Your pain becomes severe.  You have a fever or persistent symptoms for more than 2 to 3 days.  You have a fever and your symptoms suddenly get worse.  Your skin or the white part of your eyes turn yellow (jaundice).  You develop vomiting.  You feel dizzy, or you faint.  Your blood sugar is high (over 300 mg/dL). MAKE SURE YOU:   Understand these instructions.  Will watch your condition.  Will get help right away if you are not doing well or get worse. Document Released: 04/21/2005 Document Revised: 10/21/2011 Document Reviewed: 07/31/2011 [facility] Patient Information 2015 [employer] [place]. This  information is not intended to replace advice given to you by your health care provider. Make sure you discuss any questions you have with your health care provider. ° °

## 2014-06-04 NOTE — ED Provider Notes (Signed)
CSN: [id number]     Arrival date & time 06/03/14  2327 History   This chart was scribed for Riley KaplanAnkit Nahlia Hellmann, MD by Abel PrestoKara Demonbreun, ED Scribe. This patient was seen in room D36C/D36C and the patient's care was started at 12:54 AM.    Chief Complaint  Patient presents with  . Abdominal Pain    Patient is a 39 y.o. male presenting with abdominal pain. The history is provided by the patient. No language interpreter was used.  Abdominal Pain Associated symptoms: nausea   Associated symptoms: no vomiting    HPI Comments: Riley Kim is a 39 y.o. male with PMHx of asthma, alcohol-induced pancreatitis, vertigo, and arthritis who presents to the Emergency Department complaining of abdominal pain. Pt notes pain radiates to chest wall laterally and back. Pt notes associated nausea. Pt was just seen in ED for pancreatitis due to EtOH use and discharged at [facility]. Pt denies EtOH use since discharge. Pt states pain has worsened since discharge, but had never gone away completely. Pt notes no relief with oxycodone. Pt did eat solid food (burgers or taco). Pt denies vomiting.    ROS 10 Systems reviewed and are negative for acute change except as noted in the HPI.      Past Medical History  Diagnosis Date  . Asthma   . Vertigo   . 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 Arthritis     "right foot, I broke it before"   Past Surgical History  Procedure Laterality Date  . Replantation thumb Right 2000   No family history on file. History  Substance Use Topics  . Smoking status: Current Every Day Smoker -- 1.00 packs/day for 20 years    Types: Cigarettes  . Smokeless tobacco: Never Used     Comment: cutting back  . Alcohol Use: 4.8 oz/week    7 Cans of beer, 1 Shots of liquor per week    Review of Systems  Gastrointestinal: Positive for nausea and abdominal pain. Negative for vomiting.  All other systems reviewed and are  negative.     Allergies  Penicillins and Lisinopril  Home Medications   Prior to Admission medications   Medication Sig Start Date End Date Taking? Authorizing Provider  folic acid (FOLVITE) 1 MG tablet Take 1 tablet (1 mg total) by mouth daily. 03/08/14  Yes Ejiroghene E Emokpae, MD  hydrochlorothiazide (HYDRODIURIL) 25 MG tablet Take 1 tablet (25 mg total) by mouth daily. 03/05/14  Yes Raeford RazorStephen Kohut, MD  Multiple Vitamin (MULTIVITAMIN WITH MINERALS) TABS tablet Take 1 tablet by mouth daily.   Yes Historical Provider, MD  ondansetron (ZOFRAN) 4 MG tablet Take 1 tablet (4 mg total) by mouth every 8 (eight) hours as needed for nausea or vomiting. 06/03/14  Yes Ejiroghene E Emokpae, MD  oxyCODONE-acetaminophen (PERCOCET/ROXICET) 5-325 MG per tablet Take 1-2 tablets by mouth every 6 (six) hours as needed for moderate pain. 06/03/14  Yes Ejiroghene Wendall StadeE Emokpae, MD  thiamine 100 MG tablet Take 1 tablet (100 mg total) by mouth daily. 03/08/14  Yes Ejiroghene E Emokpae, MD  albuterol (PROVENTIL HFA;VENTOLIN HFA) 108 (90 BASE) MCG/ACT inhaler Inhale 2 puffs into the lungs every 6 (six) hours as needed for wheezing or shortness of breath. Patient not taking: Reported on 06/03/2014 01/05/14   Ky BarbanSolianny D Kennerly, MD  HYDROcodone-acetaminophen (NORCO/VICODIN) 5-325 MG per tablet Take 1 tablet by mouth every 6 (six) hours as needed. 06/04/14   Tykira Wachs,  MD  ondansetron (ZOFRAN ODT) 8 MG disintegrating tablet Take 1 tablet (8 mg total) by mouth every 8 (eight) hours as needed for nausea. 06/04/14   Riley Kaplan, MD  promethazine (PHENERGAN) 25 MG suppository Place 1 suppository (25 mg total) rectally every 6 (six) hours as needed for nausea. 06/04/14   Anuel Sitter, MD   BP 132/97 mmHg  Pulse 75  Temp(Src) 98.5 F (36.9 C) (Oral)  Resp 16  SpO2 96% Physical Exam  Constitutional: He appears well-developed and well-nourished. No distress.  HENT:  Head: Normocephalic and atraumatic.  Eyes:  Conjunctivae are normal. Right eye exhibits no discharge. Left eye exhibits no discharge.  Neck: Neck supple.  Cardiovascular: Normal rate, regular rhythm and normal heart sounds.  Exam reveals no gallop and no friction rub.   No murmur heard. Pulmonary/Chest: Effort normal and breath sounds normal. No respiratory distress.  lungs are clear anterior auscultation  Abdominal: Soft. He exhibits no distension. There is tenderness (Epigastric and upper quadrant) in the right upper quadrant, epigastric area and left upper quadrant. There is guarding (voluntary).  Musculoskeletal: He exhibits no edema or tenderness.  Neurological: He is alert.  Skin: Skin is warm and dry.  Psychiatric: He has a normal mood and affect. His behavior is normal. Thought content normal.  Nursing note and vitals reviewed.   ED Course  Procedures (including critical care time) DIAGNOSTIC STUDIES: Oxygen Saturation is 99% on room air, normal by my interpretation.    COORDINATION OF CARE: 12:59 AM Discussed treatment plan with patient at beside, the patient agrees with the plan and has no further questions at this time.   Labs Review Labs Reviewed  LIPASE, BLOOD - Abnormal; Notable for the following:    Lipase 309 (*)    All other components within normal limits    Imaging Review No results found.   EKG Interpretation None      MDM   Final diagnoses:  Alcohol-induced acute pancreatitis   I personally performed the services described in this documentation, which was scribed in my presence. The recorded information has been reviewed and is accurate.  Pt comes in with cc of abd pain.  DDx includes: Pancreatitis Hepatobiliary pathology including cholecystitis Gastritis/PUD SBO ACS syndrome Aortic Dissection  Pt comes in with abd pain. Clinically appears to be pancreatitis. No peritoneal signs.  Labs from just few hours back checked -and there is slight LFT elevation, no significant leukocytosis.   Pt's VSS and WNL, there is no fever. He is non toxic, and pain responded well to dilaudid. I dont think CT is indicated. I dont think admission is indicated, as his pain was broke here, and he has passed po challenge. Pt hesitant at going home with oxy - as they don't help him enough - but we have agrees to SLOWLY advance the diet and take pain meds as prescribed and refrain from etoh to better control his sx. He will return if his pain is unbearable.    Riley Kaplan, MD 06/04/14 [phone]

## 2014-06-04 NOTE — ED Notes (Signed)
Patient oxygen saturation decreased to 80% on RA post dilaudid administration. O2 via [place] applied @ 4L for support.

## 2014-06-05 NOTE — Discharge Summary (Signed)
Name: Riley Kim MRN: [id number] DOB: [birth year] 39 y.o. PCP: Jill AlexandersAlexa Richardson, MD  Date of Admission: 1/302016 12:47 AM Date of Discharge: 1/31//2016 Attending Physician: Patrcia DollyNarendra Niscal, MD.  Discharge Diagnosis: Principal Problem:  Acute alcoholic pancreatitis Active Problems:  Alcohol abuse  Essential hypertension  Pancreatitis, alcoholic, acute  Discharge Medications:   Medication List    STOP taking these medications       HYDROcodone-acetaminophen 10-325 MG per tablet  Commonly known as: NORCO     multivitamin with minerals Tabs tablet      TAKE these medications       albuterol 108 (90 BASE) MCG/ACT inhaler  Commonly known as: PROVENTIL HFA;VENTOLIN HFA  Inhale 2 puffs into the lungs every 6 (six) hours as needed for wheezing or shortness of breath.     folic acid 1 MG tablet  Commonly known as: FOLVITE  Take 1 tablet (1 mg total) by mouth daily.     hydrochlorothiazide 25 MG tablet  Commonly known as: HYDRODIURIL  Take 1 tablet (25 mg total) by mouth daily.     ondansetron 4 MG tablet  Commonly known as: ZOFRAN  Take 1 tablet (4 mg total) by mouth every 8 (eight) hours as needed for nausea or vomiting.     oxyCODONE-acetaminophen 5-325 MG per tablet  Commonly known as: PERCOCET/ROXICET  Take 1-2 tablets by mouth every 6 (six) hours as needed for moderate pain.     thiamine 100 MG tablet  Take 1 tablet (100 mg total) by mouth daily.       Disposition and follow-up:   [facility]  At the hospital follow up visit please address:  1.  Alcohol cessation, resources available in the community to assist patient with this. He expressed interest in rehab programs.  2.  Labs / imaging needed at time of follow-up: None  3.  Pending labs/ test needing follow-up: None  Follow-up Appointments- We will call you with an  appointment, to see us in clinic.  Discharge Instructions:     Discharge Instructions    Diet - low sodium heart healthy  Complete by: As directed      Discharge instructions  Complete by: As directed   Please for the next 3-4 days stay away from fatty foods, stay hydrated with lots of water. We have also prescribed some pain medications to help with your pain. Also some medications for nausea and vomiting.   You will be called with an appointment in 1-2 weeks. Also please stop drinking alcohol. You can also request some counselling and request for resources in the community about this when you come to clinic.     Increase activity slowly  Complete by: As directed      Consultations:  None  Procedures Performed:  No results found.  2D Echo: None  Cardiac Cath: None  Admission HPI: By Farley LyAdam [name], MD.  Chief Complaint: Abdominal pain  History of Present Illness:  39yo M w/ PMH EtOH use and recurrent pancreatitis presents with abdominal pain. He was in his usual state of health until Thursday 06/01/14 when he had onset of sharp epigastric abdominal pain that radiates to his back. He said this began shortly after eating some tacos. The pain comes and goes. There has been some nausea but no emesis. He says this feels like his previous episodes of pancreatitis. His last drink was a couple of shots of liquor Thursday night after  the abdominal pain had begun. He says he usually drinks about half a pint of liquor such as gin once a day. He denies any drug use. He has not eaten in the past day. He has a history of recurrent pancreatitis with three similar admission in the past six months.  In the ED, he received dilaudid 1 mg iv, zofran 4 mg iv, and protonix 40 mg iv as well as a 1 L NS and a NS infusion at 125 cc/hr.  Hospital Course by problem list:   #Pancreatitis, likely alcohol induced: Present episode likely induced by by alcohol intake. Work up revealed-  lipase- 126, better than at prior hospitalizations for the same, WBC- 11.6. Pt was started on bowel rest and IVF, with Dilaudid for pain.  Pts pain significantly improved, and by the morning of admission he was requesting to have a solid meal.  Last CT abd pelvis 12/2013 with pancreas stranding and homogeneously enhancing as well as unremarkable gallbladder. On discharge, pt reported that his abdominal pain has significantly improved, his meal was advanced and he was able to tolerate PO- regular meals without nausea or vomiting. 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 Social work was consulted to help pt with substance abuse. Help with alcohol cessation will need to be addressed at hospital follow up. Message sent to clinic for appointment in 1-2 weeks. Pt was discharge home on Percocet 5-325 #30 tabs and zofran-  Q8H.   #EtOH abuse: Drinks 1/2 pint per day. Pt interesting in rehab and is requesting to be sent to a rehab facility after discharge. CIWA protocol instituted on admission, maximum recorded score of 13, was given 1 dose of scheduled ativan, but on exam patient appeared calm, without features of withdrawal.   #HTN:  On HCTZ  daily. BP well controlled. Bp meds held while hydrating and resumed on discharge.   #Asthma: Non-contributory. Home albuterol 2 puff q6hprn continued on admission and on discharge.  Discharge Vitals:   BP 137/94 mmHg  Pulse 114  Temp(Src) 97.9 F (36.6 C) (Oral)  Resp 16  SpO2 93%  Discharge Labs:  No results found for this or any previous visit (from the past 24 hour(s)).  Signed: Onnie Boer, MD 06/05/2014, 12:04 PM    Services Ordered on Discharge: None Equipment Ordered on Discharge: None

## 2014-06-13 ENCOUNTER — Telehealth: Payer: Self-pay | Admitting: Internal Medicine

## 2014-06-13 NOTE — Telephone Encounter (Signed)
Call to patient to confirm appointment for 2/10 at 1:15 unable to leave message

## 2014-06-14 ENCOUNTER — Ambulatory Visit: Payer: Self-pay | Admitting: Internal Medicine

## 2014-07-23 ENCOUNTER — Encounter (HOSPITAL_COMMUNITY): Payer: Self-pay | Admitting: Emergency Medicine

## 2014-07-23 ENCOUNTER — Emergency Department (HOSPITAL_COMMUNITY)
Admission: EM | Admit: 2014-07-23 | Discharge: 2014-07-23 | Disposition: A | Discharge status: Home / Self Care | Payer: Medicaid Other | Attending: Emergency Medicine | Admitting: Emergency Medicine

## 2014-07-23 DIAGNOSIS — K852 Alcohol induced acute pancreatitis without necrosis or infection: Secondary | ICD-10-CM

## 2014-07-23 DIAGNOSIS — Z79899 Other long term (current) drug therapy: Secondary | ICD-10-CM | POA: Insufficient documentation

## 2014-07-23 DIAGNOSIS — Z720 Tobacco use: Secondary | ICD-10-CM | POA: Insufficient documentation

## 2014-07-23 DIAGNOSIS — R1013 Epigastric pain: Secondary | ICD-10-CM | POA: Diagnosis present

## 2014-07-23 DIAGNOSIS — J45901 Unspecified asthma with (acute) exacerbation: Secondary | ICD-10-CM | POA: Insufficient documentation

## 2014-07-23 DIAGNOSIS — M1990 Unspecified osteoarthritis, unspecified site: Secondary | ICD-10-CM | POA: Insufficient documentation

## 2014-07-23 DIAGNOSIS — Z880 Allergy status to penicillin: Secondary | ICD-10-CM | POA: Diagnosis not present

## 2014-07-23 DIAGNOSIS — R062 Wheezing: Secondary | ICD-10-CM

## 2014-07-23 LAB — CBC WITH DIFFERENTIAL/PLATELET
Basophils Absolute: 0 10*3/uL (ref 0.0–0.1)
Basophils Relative: 0 % (ref 0–1)
EOS ABS: 0.2 10*3/uL (ref 0.0–0.7)
Eosinophils Relative: 2 % (ref 0–5)
HCT: 41.8 % (ref 39.0–52.0)
HEMOGLOBIN: 14.2 g/dL (ref 13.0–17.0)
LYMPHS ABS: 1.4 10*3/uL (ref 0.7–4.0)
Lymphocytes Relative: 18 % (ref 12–46)
MCH: 25.4 pg — ABNORMAL LOW (ref 26.0–34.0)
MCHC: 34 g/dL (ref 30.0–36.0)
MCV: 74.6 fL — AB (ref 78.0–100.0)
Monocytes Absolute: 0.9 10*3/uL (ref 0.1–1.0)
Monocytes Relative: 12 % (ref 3–12)
Neutro Abs: 5.1 10*3/uL (ref 1.7–7.7)
Neutrophils Relative %: 68 % (ref 43–77)
PLATELETS: 153 10*3/uL (ref 150–400)
RBC: 5.6 MIL/uL (ref 4.22–5.81)
RDW: 14.7 % (ref 11.5–15.5)
WBC: 7.7 10*3/uL (ref 4.0–10.5)

## 2014-07-23 LAB — COMPREHENSIVE METABOLIC PANEL
ALBUMIN: 4.4 g/dL (ref 3.5–5.2)
ALK PHOS: 66 U/L (ref 39–117)
ALT: 58 U/L — AB (ref 0–53)
ANION GAP: 10 (ref 5–15)
AST: 94 U/L — ABNORMAL HIGH (ref 0–37)
BILIRUBIN TOTAL: 1.2 mg/dL (ref 0.3–1.2)
BUN: 8 mg/dL (ref 6–23)
CHLORIDE: 92 mmol/L — AB (ref 96–112)
CO2: 30 mmol/L (ref 19–32)
Calcium: 10.2 mg/dL (ref 8.4–10.5)
Creatinine, Ser: 0.77 mg/dL (ref 0.50–1.35)
GFR calc non Af Amer: >90 mL/min (ref >90)
GLUCOSE: 101 mg/dL — AB (ref 70–99)
POTASSIUM: 3.4 mmol/L — AB (ref 3.5–5.1)
Sodium: 132 mmol/L — ABNORMAL LOW (ref 135–145)
Total Protein: 8.7 g/dL — ABNORMAL HIGH (ref 6.0–8.3)

## 2014-07-23 LAB — LIPASE, BLOOD: Lipase: 151 U/L — ABNORMAL HIGH (ref 11–59)

## 2014-07-23 MED ORDER — ONDANSETRON 4 MG PO TBDP
8.0000 mg | ORAL_TABLET | Freq: Once | ORAL | Status: AC
  Administered 2014-07-23: 8 mg via ORAL

## 2014-07-23 MED ORDER — IPRATROPIUM-ALBUTEROL 0.5-2.5 (3) MG/3ML IN SOLN
3.0000 mL | Freq: Once | RESPIRATORY_TRACT | Status: AC
  Administered 2014-07-23: 3 mL via RESPIRATORY_TRACT
  Filled 2014-07-23: qty 3

## 2014-07-23 MED ORDER — SODIUM CHLORIDE 0.9 % IV BOLUS (SEPSIS)
1000.0000 mL | Freq: Once | INTRAVENOUS | Status: AC
  Administered 2014-07-23: 1000 mL via INTRAVENOUS

## 2014-07-23 MED ORDER — ONDANSETRON HCL 4 MG PO TABS: 4.0000 mg | ORAL_TABLET | Freq: Four times a day (QID) | ORAL | Status: DC

## 2014-07-23 MED ORDER — LORAZEPAM 2 MG/ML IJ SOLN
1.0000 mg | Freq: Once | INTRAMUSCULAR | Status: AC
  Administered 2014-07-23: 1 mg via INTRAVENOUS
  Filled 2014-07-23: qty 1

## 2014-07-23 MED ORDER — HYDROMORPHONE HCL 1 MG/ML IJ SOLN
1.0000 mg | Freq: Once | INTRAMUSCULAR | Status: AC
  Administered 2014-07-23: 1 mg via INTRAVENOUS
  Filled 2014-07-23: qty 1

## 2014-07-23 MED ORDER — ONDANSETRON 4 MG PO TBDP
ORAL_TABLET | ORAL | Status: AC
  Filled 2014-07-23: qty 2

## 2014-07-23 MED ORDER — LORAZEPAM 1 MG PO TABS: 1.0000 mg | ORAL_TABLET | Freq: Two times a day (BID) | ORAL | Status: DC

## 2014-07-23 MED ORDER — ONDANSETRON HCL 4 MG/2ML IJ SOLN
4.0000 mg | Freq: Once | INTRAMUSCULAR | Status: AC
  Administered 2014-07-23: 4 mg via INTRAVENOUS
  Filled 2014-07-23: qty 2

## 2014-07-23 MED ORDER — SODIUM CHLORIDE 0.9 % IV BOLUS (SEPSIS)
1000.0000 mL | Freq: Once | INTRAVENOUS | Status: AC
Start: 2014-07-23 — End: 2014-07-23
  Administered 2014-07-23: 1000 mL via INTRAVENOUS

## 2014-07-23 MED ORDER — ONDANSETRON HCL 4 MG/2ML IJ SOLN
4.0000 mg | Freq: Once | INTRAMUSCULAR | Status: DC
  Filled 2014-07-23: qty 2

## 2014-07-23 NOTE — Discharge Instructions (Signed)

## 2014-07-23 NOTE — ED Notes (Signed)
Pt c/o epigastric pain x 2 days. Hx of pancreatitis. Pain associated with NV. Denies nausea on assessment after receiving PO Zofran. Pt also has wheezing throughout. Reports wheezing starts when he has a pancreatitis flare up. Refuses chest xray; sts "I don't think that's necessary right now."

## 2014-07-23 NOTE — ED Notes (Signed)
Ginger ale given for PO challenge.

## 2014-07-23 NOTE — ED Notes (Signed)
Pt maintaining fluids without difficulty or increased pain

## 2014-07-23 NOTE — ED Provider Notes (Signed)
CSN: [id number]     Arrival date & time 07/23/14  1627 History   First MD Initiated Contact with Patient 07/23/14 1818     Chief Complaint  Patient presents with  . Abdominal Pain  . Nausea  . Emesis     (Consider location/radiation/quality/duration/timing/severity/associated sxs/prior Treatment) HPI Comments: Patient with past medical history of alcohol-induced pancreatitis and asthma presents to the emergency department with chief complaint of epigastric abdominal pain. Patient states that the symptoms started 2 days ago. He reports having some beer on Wednesday. He states that since the abdominal pain has come on, he has been unable to tolerate oral intake. He states that he was able to keep down some hydrocodone tablets, which did not help with his symptoms. He reports that his pain is severe. He denies any fevers or chills. Denies any diarrhea. States that this feels similar to his prior episodes pancreatitis. Additionally, he states that he has some wheezing in lungs. He states that this normally happens when he has pancreatitis flares.  The history is provided by the patient. No language interpreter was used.    Past Medical History  Diagnosis Date  . Asthma   . Vertigo   . 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 Arthritis     "right foot, I broke it before"   Past Surgical History  Procedure Laterality Date  . Replantation thumb Right 2000   No family history on file. History  Substance Use Topics  . Smoking status: Current Every Day Smoker -- 1.00 packs/day for 20 years    Types: Cigarettes  . Smokeless tobacco: Never Used     Comment: cutting back  . Alcohol Use: 4.8 oz/week    7 Cans of beer, 1 Shots of liquor per week    Review of Systems  Constitutional: Negative for fever and chills.  Respiratory: Positive for wheezing. Negative for shortness of breath.   Cardiovascular: Negative for chest pain.  Gastrointestinal:  Positive for nausea, vomiting and abdominal pain. Negative for diarrhea and constipation.  Genitourinary: Negative for dysuria.  All other systems reviewed and are negative.     Allergies  Penicillins and Lisinopril  Home Medications   Prior to Admission medications   Medication Sig Start Date End Date Taking? Authorizing Provider  albuterol (PROVENTIL HFA;VENTOLIN HFA) 108 (90 BASE) MCG/ACT inhaler Inhale 2 puffs into the lungs every 6 (six) hours as needed for wheezing or shortness of breath. Patient not taking: Reported on 06/03/2014 01/05/14   Ky Barban, MD  folic acid (FOLVITE) 1 MG tablet Take 1 tablet (1 mg total) by mouth daily. 03/08/14   Ejiroghene Wendall Stade, MD  hydrochlorothiazide (HYDRODIURIL) 25 MG tablet Take 1 tablet (25 mg total) by mouth daily. 03/05/14   Raeford Razor, MD  HYDROcodone-acetaminophen (NORCO/VICODIN) 5-325 MG per tablet Take 1 tablet by mouth every 6 (six) hours as needed. 06/04/14   Derwood Kaplan, MD  Multiple Vitamin (MULTIVITAMIN WITH MINERALS) TABS tablet Take 1 tablet by mouth daily.    Historical Provider, MD  ondansetron (ZOFRAN ODT) 8 MG disintegrating tablet Take 1 tablet (8 mg total) by mouth every 8 (eight) hours as needed for nausea. 06/04/14   Derwood Kaplan, MD  ondansetron (ZOFRAN) 4 MG tablet Take 1 tablet (4 mg total) by mouth every 8 (eight) hours as needed for nausea or vomiting. 06/03/14   Ejiroghene Wendall Stade, MD  oxyCODONE-acetaminophen (PERCOCET/ROXICET) 5-325 MG per tablet Take 1-2 tablets by  mouth every 6 (six) hours as needed for moderate pain. 06/03/14   Ejiroghene Wendall Stade, MD  promethazine (PHENERGAN) 25 MG suppository Place 1 suppository (25 mg total) rectally every 6 (six) hours as needed for nausea. 06/04/14   Derwood Kaplan, MD  thiamine 100 MG tablet Take 1 tablet (100 mg total) by mouth daily. 03/08/14   Ejiroghene E Emokpae, MD   BP 120/83 mmHg  Pulse 96  Temp(Src) 97.8 F (36.6 C) (Oral)  Resp 18  Ht  (1.88 m)   Wt 221 lb (100.245 kg)  BMI 28.36 kg/m2  SpO2 96% Physical Exam  Constitutional: He is oriented to person, place, and time. He appears well-developed and well-nourished.  HENT:  Head: Normocephalic and atraumatic.  Eyes: Conjunctivae and EOM are normal. Pupils are equal, round, and reactive to light. Right eye exhibits no discharge. Left eye exhibits no discharge. No scleral icterus.  Neck: Normal range of motion. Neck supple. No JVD present.  Cardiovascular: Normal rate, regular rhythm and normal heart sounds.  Exam reveals no gallop and no friction rub.   No murmur heard. Pulmonary/Chest: Effort normal. No respiratory distress. He has wheezes. He has no rales. He exhibits no tenderness.  Scattered wheezes  Abdominal: Soft. He exhibits no distension and no mass. There is tenderness. There is no rebound and no guarding.  Epigastric region is tender to palpation, with no other focal abdominal tenderness  Musculoskeletal: Normal range of motion. He exhibits no edema or tenderness.  Neurological: He is alert and oriented to person, place, and time.  Skin: Skin is warm and dry.  Psychiatric: He has a normal mood and affect. His behavior is normal. Judgment and thought content normal.  Nursing note and vitals reviewed.   ED Course  Procedures (including critical care time) Labs Review Labs Reviewed  CBC WITH DIFFERENTIAL/PLATELET - Abnormal; Notable for the following:    MCV 74.6 (*)    MCH 25.4 (*)    All other components within normal limits  COMPREHENSIVE METABOLIC PANEL - Abnormal; Notable for the following:    Sodium 132 (*)    Potassium 3.4 (*)    Chloride 92 (*)    Glucose, Bld 101 (*)    Total Protein 8.7 (*)    AST 94 (*)    ALT 58 (*)    All other components within normal limits  LIPASE, BLOOD    Imaging Review No results found.   EKG Interpretation None      MDM   Final diagnoses:  Wheezing  Pancreatitis, alcoholic, acute    Patient with history of  alcohol-induced pancreatitis. Last EtOH was on Wednesday. Patient states that he has had persistent symptoms since 2 days ago. We'll treat pain, check labs, give fluids, and will reassess. Patient does have some wheezing on exam, I will check a chest x-ray and given a breathing treatment.  Chemistry panel remarkable for signs of dehydration, will continue with fluids.  Patient feels better after nebs regarding the wheezing.  Refuses CXR.  Patient states that his pain is significantly improved, but still present. I will give him another dose pain medicine and fluid challenge. Patient states that he would like to go home, I also feel that he is appropriate for discharge pending fluid challenge.  Patient discussed with Dr. Fayrene Fearing, who agrees with the plan.  Patient given some ativan because he is afraid of Etoh withdrawal.  Discussed this with Dr. Fayrene Fearing, who states that 6 tablets with 1 tab BID should  be sufficient.  Patient understands and agrees with the plan.    Roxy Horsemanobert Tyrece Vanterpool, PA-C 07/23/14 2242  Rolland PorterMark James, MD 07/26/14 [phone]

## 2014-07-23 NOTE — ED Notes (Signed)
Pt vomited ginger ale into trashcan

## 2014-07-23 NOTE — ED Notes (Signed)
Pt c/o abdominal pain with n/v onset yesterday.

## 2014-07-24 ENCOUNTER — Other Ambulatory Visit: Payer: Self-pay | Admitting: Internal Medicine

## 2014-07-25 ENCOUNTER — Telehealth: Payer: Self-pay | Admitting: Internal Medicine

## 2014-07-25 ENCOUNTER — Other Ambulatory Visit: Payer: Self-pay | Admitting: *Deleted

## 2014-07-25 ENCOUNTER — Other Ambulatory Visit: Payer: Self-pay | Admitting: Internal Medicine

## 2014-07-25 NOTE — Telephone Encounter (Signed)
Call to patient to confirm appointment for 07/26/14 at 1:15 no answer

## 2014-07-26 ENCOUNTER — Encounter: Payer: Self-pay | Admitting: Internal Medicine

## 2014-07-26 ENCOUNTER — Ambulatory Visit (INDEPENDENT_AMBULATORY_CARE_PROVIDER_SITE_OTHER): Payer: Self-pay | Admitting: Internal Medicine

## 2014-07-26 VITALS — BP 112/78 | HR 101 | Temp 97.9°F | Ht 74.0 in | Wt 217.4 lb

## 2014-07-26 DIAGNOSIS — F1010 Alcohol abuse, uncomplicated: Secondary | ICD-10-CM

## 2014-07-26 DIAGNOSIS — K852 Alcohol induced acute pancreatitis without necrosis or infection: Secondary | ICD-10-CM

## 2014-07-26 DIAGNOSIS — J4530 Mild persistent asthma, uncomplicated: Secondary | ICD-10-CM

## 2014-07-26 DIAGNOSIS — I10 Essential (primary) hypertension: Secondary | ICD-10-CM

## 2014-07-26 MED ORDER — FOLIC ACID 1 MG PO TABS: 1.0000 mg | ORAL_TABLET | Freq: Every day | ORAL | Status: DC

## 2014-07-26 MED ORDER — OXYCODONE-ACETAMINOPHEN 5-325 MG PO TABS: 1.0000 | ORAL_TABLET | Freq: Every day | ORAL | Status: DC | PRN

## 2014-07-26 MED ORDER — ONDANSETRON HCL 4 MG PO TABS: 4.0000 mg | ORAL_TABLET | Freq: Four times a day (QID) | ORAL | Status: DC

## 2014-07-26 MED ORDER — ALBUTEROL SULFATE HFA 108 (90 BASE) MCG/ACT IN AERS: 2.0000 | INHALATION_SPRAY | Freq: Four times a day (QID) | RESPIRATORY_TRACT | Status: AC | PRN

## 2014-07-26 NOTE — Patient Instructions (Signed)
General Instructions:   Thank you for bringing your medicines today. This helps us keep you safe from mistakes.   FOR YOUR PANCREATITIS: -TAKE OXYCODONE 1 PILL DAILY AS NEEDED -TAKE ZOFRAN FOR NAUSEA AS NEEDED -CONTINUE TO TAKE FOLIC ACID AND THIAMINE -MEET WITH [facility] -I WILL TALK TO SHANA GRADY ABOUT POSSIBLE REHAB PLACEMENT  FOR YOUR BLOOD PRESSURE STOP TAKING HYDROCHLOROTHIAZIDE RETURN IN 2 WEEKS FOR BLOOD PRESSURE RECHECK  Finding Treatment for Alcohol and Drug Addiction It can be hard to find the right place to get professional treatment. Here are some important things to consider:  There are different types of treatment to choose from.  Some programs are live-in (residential) while others are not (outpatient). Sometimes a combination is offered.  No single type of program is right for everyone.  Most treatment programs involve a combination of education, counseling, and a 12-step, spiritually-based approach.  There are non-spiritually based programs (not 12-step).  Some treatment programs are government sponsored. They are geared for patients without private insurance.  Treatment programs can vary in many respects such as:  Cost and types of insurance accepted.  Types of on-site medical services offered.  Length of stay, setting, and size.  Overall philosophy of treatment. A person may need specialized treatment or have needs not addressed by all programs. For example, adolescents need treatment appropriate for their age. Other people have secondary disorders that must be managed as well. Secondary conditions can include mental illness, such as depression or diabetes. Often, a period of detoxification from alcohol or drugs is needed. This requires medical supervision and not all programs offer this. THINGS TO CONSIDER WHEN SELECTING A TREATMENT PROGRAM   Is the program certified by the appropriate government agency? Even private programs must be certified and  employ certified professionals.  Does the program accept your insurance? If not, can a payment plan be set up?  Is the facility clean, organized, and well run? Do they allow you to speak with graduates who can share their treatment experience with you? Can you tour the facility? Can you meet with staff?  Does the program meet the full range of individual needs?  Does the treatment program address sexual orientation and physical disabilities? Do they provide age, gender, and culturally appropriate treatment services?  Is treatment available in languages other than English?  Is long-term aftercare support or guidance encouraged and provided?  Is assessment of an individual's treatment plan ongoing to ensure it meets changing needs?  Does the program use strategies to encourage reluctant patients to remain in treatment long enough to increase the likelihood of success?  Does the program offer counseling (individual or group) and other behavioral therapies?  Does the program offer medicine as part of the treatment regimen, if needed?  Is there ongoing monitoring of possible relapse? Is there a defined relapse prevention program? Are services or referrals offered to family members to ensure they understand addiction and the recovery process? This would help them support the recovering individual.  Are 12-step meetings held at the center or is transport available for patients to attend outside meetings? In countries outside of the [place], Magazine features editorsee local directories for contact information for services in your area. Document Released: 03/20/2005 Document Revised: 07/14/2011 Document Reviewed: 09/30/2007 [facility] Patient Information 2015 [place]. This information is not intended to replace advice given to you by your health care provider. Make sure you discuss any questions you have with your health care provider.

## 2014-07-26 NOTE — Progress Notes (Signed)
   Subjective:    Patient ID: Riley Kim, male    DOB: May 13, 1975, 39 y.o.   MRN: [id number]  HPI Riley Kim is a 39 yo male with PMHx of alcoholic pancreatitis, alcohol abuse, tobacco abuse, and hypertension who presents for follow up for his pancreatitis. Please see problem based assessment and plan for more information.  Review of Systems General: Denies fever, chills  Respiratory: Denies SOB, cough Cardiovascular: Denies chest pain and palpitations.  Gastrointestinal: Denies nausea, vomiting, abdominal pain, diarrhea, constipation Neurological: Denies dizziness, weakness, lightheadedness Psychiatric/Behavioral: Admits to anxiety, nervousness, sleep disturbance  Past Medical History  Diagnosis Date  . Asthma   . Vertigo   . 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 Arthritis     "right foot, I broke it before"   Outpatient Encounter Prescriptions as of 07/26/2014  Medication Sig Note  . albuterol (PROVENTIL HFA;VENTOLIN HFA) 108 (90 BASE) MCG/ACT inhaler Inhale 2 puffs into the lungs every 6 (six) hours as needed for wheezing or shortness of breath.   . folic acid (FOLVITE) 1 MG tablet Take 1 tablet (1 mg total) by mouth daily.   . Multiple Vitamin (MULTIVITAMIN WITH MINERALS) TABS tablet Take 1 tablet by mouth daily.   . ondansetron (ZOFRAN) 4 MG tablet Take 1 tablet (4 mg total) by mouth every 6 (six) hours.   Marland Kitchen. oxyCODONE-acetaminophen (PERCOCET/ROXICET) 5-325 MG per tablet Take 1 tablet by mouth daily as needed for moderate pain.   . promethazine (PHENERGAN) 25 MG suppository Place 1 suppository (25 mg total) rectally every 6 (six) hours as needed for nausea.   . [DISCONTINUED] albuterol (PROVENTIL HFA;VENTOLIN HFA) 108 (90 BASE) MCG/ACT inhaler Inhale 2 puffs into the lungs every 6 (six) hours as needed for wheezing or shortness of breath. (Patient not taking: Reported on 06/03/2014) 04/11/2014: Has not got prescription filled   .  [DISCONTINUED] hydrochlorothiazide (HYDRODIURIL) 25 MG tablet Take 1 tablet (25 mg total) by mouth daily.   . [DISCONTINUED] HYDROcodone-acetaminophen (NORCO/VICODIN) 5-325 MG per tablet Take 1 tablet by mouth every 6 (six) hours as needed.   . [DISCONTINUED] LORazepam (ATIVAN) 1 MG tablet Take 1 tablet (1 mg total) by mouth 2 (two) times daily.   . [DISCONTINUED] ondansetron (ZOFRAN ODT) 8 MG disintegrating tablet Take 1 tablet (8 mg total) by mouth every 8 (eight) hours as needed for nausea.   . [DISCONTINUED] ondansetron (ZOFRAN) 4 MG tablet Take 1 tablet (4 mg total) by mouth every 6 (six) hours.   . [DISCONTINUED] oxyCODONE-acetaminophen (PERCOCET/ROXICET) 5-325 MG per tablet Take 1-2 tablets by mouth every 6 (six) hours as needed for moderate pain.       Objective:   Physical Exam Filed Vitals:   07/26/14 1348 07/26/14 1453  BP: 92/56 112/78  Pulse: 120 101  Temp: 97.9 F (36.6 C)   TempSrc: Oral   Height: 6\' 2"  (1.88 m)   Weight: 217 lb 6.4 oz (98.612 kg)   SpO2: 97%    General: Vital signs reviewed.  Patient is anxious, in no acute distress and cooperative with exam.  Cardiovascular: Tachycardic, regular rhythm Pulmonary/Chest: Clear to auscultation bilaterally, no wheezes, rales, or rhonchi. Abdominal: Soft, non-tender, non-distended, BS + Extremities: No lower extremity edema bilaterally Neurological: A&O x3 Skin: Warm, dry and intact.  Psychiatric: Anxious. Speech and behavior is normal. Cognition and memory are normal.     Assessment & Plan:   Please see problem based assessment and plan.

## 2014-07-26 NOTE — Assessment & Plan Note (Signed)
Patient has mild expiratory wheezing in lower lung fields but denies shortness of breath. He never filled his albuterol inhaler because he does not think he needs it and states it is too expensive.   Plan: -Refill albuterol inhaler -Encourage patient to fill if needed

## 2014-07-26 NOTE — Assessment & Plan Note (Addendum)
Patient drinks about 2-3 pints of gin per day. He quit "cold [place]" 1 week ago. He started developing rapid heart rate, diaphoresis, palpitations, shaking, and visual hallucinations 2-3 days afterwards. He was seen in the ED 4 days afterwards for pancreatitis and patient was discharged on Ativan 6 pills for withdrawal symptoms. Patient states he had 2 liquor drinks today. He denies any withdrawal symptoms. He requests Ativan so that he can quit cold [place] on his own at home. He also still would like to go to a rehab facility but is limited due to insurance reasons.   Plan: -Counseled patient on the dangers of quitting alcohol all at once. Educated him on the symptoms of withdrawal and the need to be seen in the ED if he develops these symptoms again-especially hallucinations. -I will not be prescribing Ativan for him as this is dangerous -Referral to [facility] for insurance meeting -Referral to Social Work for possible placement for rehab -Folic Acid 1 mg daily -Continue Multivitamin -Continue Thiamine

## 2014-07-26 NOTE — Assessment & Plan Note (Addendum)
BP Readings from Last 3 Encounters:  07/26/14 112/78  07/23/14 137/94  06/04/14 137/94    Lab Results  Component Value Date   NA 132* 07/23/2014   K 3.4* 07/23/2014   CREATININE 0.77 07/23/2014    Assessment: Blood pressure control:  Low Progress toward BP goal:   Below goal Comments: Patient has been compliant with HCTZ 25 mg daily. He denies any dizziness or lightheadness. There is concern with low-normal blodo pressure and tachycardia, that patient is dehydrated and HCTZ is worsening this. He admits to decreased water and food intake.   Plan: Medications: Hold HCTZ 25 mg daily. Patient will check blood pressure at [facility] this week.  Other plans: Return in 2 weeks for bp recheck

## 2014-07-27 NOTE — Progress Notes (Signed)
Case discussed with Dr. Richardson at time of visit. We reviewed the resident's history and exam and pertinent patient test results. I agree with the assessment, diagnosis, and plan of care documented in the resident's note. 

## 2014-07-27 NOTE — Assessment & Plan Note (Signed)
Patient was recently seen in the ED on 07/23/14 for recurrent epigastric pain secondary to alcoholic pancreatitis. Lipase elevated. Patient was rehydrated and discharged from the ED with oxycodone and ativan. Patient has started drinking again today. He denies any current nausea, vomiting or epigastric pain, but does admit to possible dehydration as he has been eating less and drinking less water.   Plan: -Patient counseled -Refill Oxycodone 5-325 1 tablet daily prn -Zofran 4 mg Q6H prn  -Continue MV/thiamine/folic acid

## 2014-07-31 ENCOUNTER — Telehealth: Payer: Self-pay | Admitting: Licensed Clinical Social Worker

## 2014-07-31 NOTE — Telephone Encounter (Signed)
[facility] [phone] [facility] (Residential): [phone] [facility] (Outpatient): [phone]

## 2014-07-31 NOTE — Telephone Encounter (Signed)
Riley Kim was referred to CSW as pt voiced interest in Rehabilitation for substance abuse.  CSW placed called to pt.  CSW left message requesting return call. CSW provided contact hours and phone number.  CSW unsure is voice mail was recorded.  Riley Kim is uninsured:  [facility]

## 2014-08-02 ENCOUNTER — Emergency Department (HOSPITAL_COMMUNITY)
Admission: EM | Admit: 2014-08-02 | Discharge: 2014-08-03 | Disposition: A | Discharge status: Home / Self Care | Payer: Medicaid Other | Attending: Emergency Medicine | Admitting: Emergency Medicine

## 2014-08-02 ENCOUNTER — Encounter (HOSPITAL_COMMUNITY): Payer: Self-pay

## 2014-08-02 DIAGNOSIS — F911 Conduct disorder, childhood-onset type: Secondary | ICD-10-CM | POA: Insufficient documentation

## 2014-08-02 DIAGNOSIS — Z8719 Personal history of other diseases of the digestive system: Secondary | ICD-10-CM | POA: Insufficient documentation

## 2014-08-02 DIAGNOSIS — M1990 Unspecified osteoarthritis, unspecified site: Secondary | ICD-10-CM | POA: Insufficient documentation

## 2014-08-02 DIAGNOSIS — Z046 Encounter for general psychiatric examination, requested by authority: Secondary | ICD-10-CM | POA: Diagnosis not present

## 2014-08-02 DIAGNOSIS — Z880 Allergy status to penicillin: Secondary | ICD-10-CM | POA: Diagnosis not present

## 2014-08-02 DIAGNOSIS — R45850 Homicidal ideations: Secondary | ICD-10-CM | POA: Diagnosis present

## 2014-08-02 DIAGNOSIS — Z720 Tobacco use: Secondary | ICD-10-CM | POA: Insufficient documentation

## 2014-08-02 DIAGNOSIS — F10229 Alcohol dependence with intoxication, unspecified: Secondary | ICD-10-CM | POA: Diagnosis not present

## 2014-08-02 DIAGNOSIS — R456 Violent behavior: Secondary | ICD-10-CM

## 2014-08-02 DIAGNOSIS — Z79899 Other long term (current) drug therapy: Secondary | ICD-10-CM | POA: Diagnosis not present

## 2014-08-02 DIAGNOSIS — J45909 Unspecified asthma, uncomplicated: Secondary | ICD-10-CM | POA: Insufficient documentation

## 2014-08-02 LAB — CBC WITH DIFFERENTIAL/PLATELET
BASOS ABS: 0.2 10*3/uL — AB (ref 0.0–0.1)
BASOS PCT: 2 % — AB (ref 0–1)
EOS PCT: 2 % (ref 0–5)
Eosinophils Absolute: 0.2 10*3/uL (ref 0.0–0.7)
HCT: 42.5 % (ref 39.0–52.0)
HEMOGLOBIN: 14.1 g/dL (ref 13.0–17.0)
Lymphocytes Relative: 36 % (ref 12–46)
Lymphs Abs: 3 10*3/uL (ref 0.7–4.0)
MCH: 25.3 pg — ABNORMAL LOW (ref 26.0–34.0)
MCHC: 33.2 g/dL (ref 30.0–36.0)
MCV: 76.2 fL — AB (ref 78.0–100.0)
MONOS PCT: 9 % (ref 3–12)
Monocytes Absolute: 0.7 10*3/uL (ref 0.1–1.0)
NEUTROS ABS: 4.2 10*3/uL (ref 1.7–7.7)
Neutrophils Relative %: 51 % (ref 43–77)
Platelets: 221 10*3/uL (ref 150–400)
RBC: 5.58 MIL/uL (ref 4.22–5.81)
RDW: 14.8 % (ref 11.5–15.5)
WBC: 8.3 10*3/uL (ref 4.0–10.5)

## 2014-08-02 LAB — COMPREHENSIVE METABOLIC PANEL
ALT: 80 U/L — ABNORMAL HIGH (ref 0–53)
ANION GAP: 15 (ref 5–15)
AST: 169 U/L — AB (ref 0–37)
Albumin: 4.6 g/dL (ref 3.5–5.2)
Alkaline Phosphatase: 62 U/L (ref 39–117)
BUN: 10 mg/dL (ref 6–23)
CHLORIDE: 102 mmol/L (ref 96–112)
CO2: 22 mmol/L (ref 19–32)
CREATININE: 0.72 mg/dL (ref 0.50–1.35)
Calcium: 9.5 mg/dL (ref 8.4–10.5)
GFR calc Af Amer: >90 mL/min (ref >90)
Glucose, Bld: 90 mg/dL (ref 70–99)
POTASSIUM: 3.9 mmol/L (ref 3.5–5.1)
SODIUM: 139 mmol/L (ref 135–145)
TOTAL PROTEIN: 8.9 g/dL — AB (ref 6.0–8.3)
Total Bilirubin: 0.5 mg/dL (ref 0.3–1.2)

## 2014-08-02 LAB — RAPID URINE DRUG SCREEN, HOSP PERFORMED
Amphetamines: NOT DETECTED
Barbiturates: NOT DETECTED
Benzodiazepines: NOT DETECTED
Cocaine: NOT DETECTED
Opiates: NOT DETECTED
Tetrahydrocannabinol: NOT DETECTED

## 2014-08-02 LAB — CBG MONITORING, ED: GLUCOSE-CAPILLARY: 87 mg/dL (ref 70–99)

## 2014-08-02 LAB — ACETAMINOPHEN LEVEL: Acetaminophen (Tylenol), Serum: <10 ug/mL — ABNORMAL LOW (ref 10–30)

## 2014-08-02 LAB — ETHANOL: Alcohol, Ethyl (B): 254 mg/dL — ABNORMAL HIGH (ref 0–9)

## 2014-08-02 LAB — SALICYLATE LEVEL: Salicylate Lvl: <4 mg/dL (ref 2.8–20.0)

## 2014-08-02 MED ORDER — ONDANSETRON HCL 4 MG PO TABS: 4.0000 mg | ORAL_TABLET | Freq: Three times a day (TID) | ORAL | Status: DC | PRN

## 2014-08-02 MED ORDER — IBUPROFEN 200 MG PO TABS: 600.0000 mg | ORAL_TABLET | Freq: Three times a day (TID) | ORAL | Status: DC | PRN

## 2014-08-02 MED ORDER — STERILE WATER FOR INJECTION IJ SOLN
INTRAMUSCULAR | Status: AC
Start: 2014-08-02 — End: 2014-08-02
  Administered 2014-08-02: 1.2 mL
  Filled 2014-08-02: qty 10

## 2014-08-02 MED ORDER — NICOTINE 21 MG/24HR TD PT24: 21.0000 mg | MEDICATED_PATCH | Freq: Every day | TRANSDERMAL | Status: DC

## 2014-08-02 MED ORDER — ZIPRASIDONE MESYLATE 20 MG IM SOLR
INTRAMUSCULAR | Status: AC
  Filled 2014-08-02: qty 20

## 2014-08-02 MED ORDER — DIPHENHYDRAMINE HCL 50 MG/ML IJ SOLN
25.0000 mg | Freq: Once | INTRAMUSCULAR | Status: AC
  Administered 2014-08-02: 25 mg via INTRAMUSCULAR
  Filled 2014-08-02: qty 1

## 2014-08-02 MED ORDER — ZIPRASIDONE MESYLATE 20 MG IM SOLR
10.0000 mg | Freq: Once | INTRAMUSCULAR | Status: AC
  Administered 2014-08-02: 10 mg via INTRAMUSCULAR

## 2014-08-02 MED ORDER — ALUM & MAG HYDROXIDE-SIMETH 200-200-20 MG/5ML PO SUSP: 30.0000 mL | ORAL | Status: DC | PRN

## 2014-08-02 MED ORDER — LORAZEPAM 1 MG PO TABS: 0.0000 mg | ORAL_TABLET | Freq: Four times a day (QID) | ORAL | Status: DC

## 2014-08-02 MED ORDER — LORAZEPAM 1 MG PO TABS: 0.0000 mg | ORAL_TABLET | Freq: Two times a day (BID) | ORAL | Status: DC

## 2014-08-02 MED ORDER — ACETAMINOPHEN 325 MG PO TABS: 650.0000 mg | ORAL_TABLET | ORAL | Status: DC | PRN

## 2014-08-02 MED ORDER — LORAZEPAM 2 MG/ML IJ SOLN: 0.0000 mg | Freq: Two times a day (BID) | INTRAMUSCULAR | Status: DC

## 2014-08-02 MED ORDER — LORAZEPAM 2 MG/ML IJ SOLN
0.0000 mg | Freq: Four times a day (QID) | INTRAMUSCULAR | Status: DC
Start: 2014-08-03 — End: 2014-08-03

## 2014-08-02 MED ORDER — VITAMIN B-1 100 MG PO TABS
100.0000 mg | ORAL_TABLET | Freq: Every day | ORAL | Status: DC
  Filled 2014-08-02: qty 1

## 2014-08-02 MED ORDER — THIAMINE HCL 100 MG/ML IJ SOLN: 100.0000 mg | Freq: Every day | INTRAMUSCULAR | Status: DC

## 2014-08-02 MED ORDER — LORAZEPAM 2 MG/ML IJ SOLN
1.0000 mg | Freq: Once | INTRAMUSCULAR | Status: AC
  Administered 2014-08-02: 1 mg via INTRAVENOUS
  Filled 2014-08-02: qty 1
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
## 2014-08-02 NOTE — ED Notes (Signed)
I am unable to collect labs at this time patient is not corperative.
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
## 2014-08-02 NOTE — ED Notes (Signed)
Pt now resting in bed, was cooperative for RN to draw blood and place on monitor, pt in currently on cardiac monitor and watching tv, given ginger ale, pt explained he is IVC'd and cannot leave until he talks to psychiatrist.

## 2014-08-03 DIAGNOSIS — F1020 Alcohol dependence, uncomplicated: Secondary | ICD-10-CM

## 2014-08-03 DIAGNOSIS — F4324 Adjustment disorder with disturbance of conduct: Secondary | ICD-10-CM

## 2014-08-03 NOTE — Discharge Instructions (Signed)
To assist in maintaining a sober, alcohol-free lifestyle, it may be beneficial for you to look into enrollment at a residential substance abuse treatment facility. Listed below are a few facilities that you can contact.        [facility]      [address]      [place], [place] [address]      [phone]        [facility]      [address]      [place], [place] [address]      [phone]        [facility]      [address]      [place], [place] [address]      [phone]

## 2014-08-03 NOTE — ED Notes (Signed)
Acuity level is low.  Patient will be discharged soon.

## 2014-08-03 NOTE — ED Notes (Signed)
Report received from RN. Pt. Alert and oriented in no distress. Currently denies SI, HI, AVH and pain.  Pt. Instructed to come to me with problems or concerns.Will continue to monitor for safety via security cameras and Q 15 minute checks.
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
## 2014-08-03 NOTE — BH Assessment (Addendum)
Tele Assessment Note   Riley Kim is a married, 39 y.o., African-American male who presents to [facility] via [facility]. Pt was placed under IVC by his wife due to reportedly making homicidal statements towards her while intoxicated last night. Pt reportedly abuses alcohol and he acknowledges that he drinks about a pint of liquor per day. Pt presents with irritable affect and minimizes the events that led up to his IVC and admission to the hospital; the pt goes on to place much of the blame for his actions onto his wife because she reportedly provokes him and is "vindictive". He admits to wanting to hit her at times, adding, "and she knows this, but she'll follow me upstairs and won't leave me alone". Pt is somewhat guarded at times during assessment and is preoccupied with finding out when he will be able to go home. Thought process is coherent and relevant and speech is linear. Pt is now well-oriented, but he was intoxicated and belligerent upon admission, making statements such as, "The police aren't going to like what I do to her [pt's wife]" and "She doesn't know what's coming to her!".   Pt has no known hx of MH diagnoses or treatment. He denies any hx of psychiatric hospitalization or outpatient services. He denies any hx of SA treatment. Pt does not endorse any withdrawal sx currently. Pt has a pending legal charge for a DUI. He denies SI, A/VH, or depressive sx. He denies any hx of suicide attempt or self-harming behaviors. Pt denies any other substance use. He denies any hx of abuse or access to weapons.  Per Donell Sievert, PA, pt to be re-evaluated by psychiatry in the morning to uphold or rescind IVC.   Axis I: 303.90 Alcohol Use Disorder, Severe Axis II: No diagnosis Axis III:  Past Medical History  Diagnosis Date  . Asthma   . Vertigo   . 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 Arthritis     "right foot, I broke it before"   Axis IV: occupational  problems, problems related to legal system/crime, problems related to social environment, problems with access to health care services and problems with primary support group Axis V: 41-50 serious symptoms  Past Medical History:  Past Medical History  Diagnosis Date  . Asthma   . Vertigo   . 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 Arthritis     "right foot, I broke it before"    Past Surgical History  Procedure Laterality Date  . Replantation thumb Right 2000    Family History: History reviewed. No pertinent family history.  Social History:  reports that he has been smoking Cigarettes.  He has a 20 pack-year smoking history. He has never used smokeless tobacco. He reports that he drinks about 4.8 oz of alcohol per week. He reports that he does not use illicit drugs.  Additional Social History:  Alcohol / Drug Use Pain Medications: See PTA List Prescriptions: See PTA List Over the Counter: See PTA List History of alcohol / drug use?: Yes Longest period of sobriety (when/how long): UTA Negative Consequences of Use: Personal relationships, Legal Withdrawal Symptoms: DTs Substance #1 Name of Substance 1: Etoh 1 - Age of First Use: UTA 1 - Amount (size/oz): "a pint" 1 - Frequency: Daily 1 - Duration: Years 1 - Last Use / Amount: Yesterday, 08/02/14  CIWA: CIWA-Ar BP: 119/79 mmHg Pulse Rate: 87 Nausea  and Vomiting: no nausea and no vomiting Tactile Disturbances: none Tremor: no tremor Auditory Disturbances: not present Paroxysmal Sweats: barely perceptible sweating, palms moist Visual Disturbances: not present Anxiety: no anxiety, at ease Headache, Fullness in Head: none present Agitation: normal activity Orientation and Clouding of Sensorium: oriented and can do serial additions CIWA-Ar Total: 1 COWS:    PATIENT STRENGTHS: (choose at least two) Ability for insight Average or above average intelligence Capable of independent  living Communication skills  Allergies:  Allergies  Allergen Reactions  . Penicillins Other (See Comments)    Rxn: unknown  . Lisinopril Swelling    Home Medications:  (Not in a hospital admission)  OB/GYN Status:  No LMP for male patient.  General Assessment Data Location of Assessment: [facility] Is this a Tele or Face-to-Face Assessment?: Tele Assessment Is this an Initial Assessment or a Re-assessment for this encounter?: Initial Assessment Living Arrangements: Spouse/significant other, Children Can pt return to current living arrangement?: Yes Admission Status: Involuntary Is patient capable of signing voluntary admission?: No Transfer from: Home Referral Source: Self/Family/Friend     [facility] Crisis Care Plan Living Arrangements: Spouse/significant other, Children Name of Psychiatrist: None Name of Therapist: None  Education Status Is patient currently in school?: No Current Grade: na Highest grade of school patient has completed: na Name of school: na Contact person: na  Risk to self with the past 6 months Suicidal Ideation: No Suicidal Intent: No Is patient at risk for suicide?: No Suicidal Plan?: No Access to Means: No What has been your use of drugs/alcohol within the last 12 months?: Daily alcohol use Previous Attempts/Gestures: No How many times?: 0 Other Self Harm Risks: Etoh addiction Intentional Self Injurious Behavior: None Family Suicide History: No Recent stressful life event(s): Conflict (Comment) (with wife) Persecutory voices/beliefs?: No Depression: No Depression Symptoms: Feeling angry/irritable Substance abuse history and/or treatment for substance abuse?: Yes Suicide prevention information given to non-admitted patients: Not applicable  Risk to Others within the past 6 months Homicidal Ideation: No-Not Currently/Within Last 6 Months Thoughts of Harm to Others: No-Not Currently Present/Within Last 6 Months Current Homicidal Intent: No-Not  Currently/Within Last 6 Months Current Homicidal Plan: No-Not Currently/Within Last 6 Months Access to Homicidal Means: No Identified Victim: Wife History of harm to others?: Yes Assessment of Violence: On admission Violent Behavior Description: Pt very irritable on admission and making threats towards wife Does patient have access to weapons?: No Criminal Charges Pending?: Yes Describe Pending Criminal Charges: DUI Does patient have a court date: Yes Court Date: 08/28/14  Psychosis Hallucinations: None noted Delusions: None noted  Mental Status Report Appearance/Hygiene: In scrubs Eye Contact: Fair Motor Activity: Agitation Speech: Logical/coherent Level of Consciousness: Irritable Mood: Preoccupied Affect: Irritable Anxiety Level: Minimal Thought Processes: Coherent, Relevant Judgement: Impaired Orientation: Person, Place, Time, Situation Obsessive Compulsive Thoughts/Behaviors: None  Cognitive Functioning Concentration: Decreased Memory: Recent Intact IQ: Average Insight: Poor Impulse Control: Poor Appetite: Good Weight Loss: 0 Weight Gain: 0 Sleep: Decreased Total Hours of Sleep: 6 Vegetative Symptoms: None  ADLScreening [facility] Assessment Services) Patient's cognitive ability adequate to safely complete daily activities?: Yes Patient able to express need for assistance with ADLs?: Yes Independently performs ADLs?: Yes (appropriate for developmental age)  Prior Inpatient Therapy Prior Inpatient Therapy: No  Prior Outpatient Therapy Prior Outpatient Therapy: No  ADL Screening (condition at time of admission) Patient's cognitive ability adequate to safely complete daily activities?: Yes Is the patient deaf or have difficulty hearing?: No Does the patient have difficulty seeing, even  when wearing glasses/contacts?: No Does the patient have difficulty concentrating, remembering, or making decisions?: No Patient able to express need for assistance with ADLs?:  Yes Does the patient have difficulty dressing or bathing?: No Independently performs ADLs?: Yes (appropriate for developmental age) Does the patient have difficulty walking or climbing stairs?: No Weakness of Legs: None Weakness of Arms/Hands: None  Home Assistive Devices/Equipment Home Assistive Devices/Equipment: None    Abuse/Neglect Assessment (Assessment to be complete while patient is alone) Physical Abuse: Denies Verbal Abuse: Denies Sexual Abuse: Denies Exploitation of patient/patient's resources: Denies Self-Neglect: Denies Values / Beliefs Cultural Requests During Hospitalization: None Spiritual Requests During Hospitalization: None   Advance Directives (For Healthcare) Does patient have an advance directive?: No Would patient like information on creating an advanced directive?: No - patient declined information    Additional Information 1:1 In Past 12 Months?: No CIRT Risk: Yes Elopement Risk: Yes Does patient have medical clearance?: Yes     Disposition: Per Donell SievertSpencer Simon, PA, pt to be re-evaluated by psychiatry in the morning to uphold or rescind IVC. Disposition Initial Assessment Completed for this Encounter: Yes Disposition of Patient: Other dispositions Other disposition(s): Other (Comment)  Cyndie MullAnna Jaylan Duggar, [facility] Triage Specialist  08/03/2014 3:05 AM

## 2014-08-03 NOTE — ED Notes (Signed)
Acuity level is low.  Patient is not experiencing any withdrawal symptoms and is not suicidal or homicidal.

## 2014-08-03 NOTE — ED Notes (Signed)
Patient discharged to home.  Left the unit ambulatory with all belongings.  Denies thoughts of harm to self or others.  Denies auditory or visual hallucinations.

## 2014-08-03 NOTE — Consult Note (Signed)
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 Elevated transaminase level [R74.0]   . Elevated bilirubin [R17]   . 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 Essential hypertension [I10] 12/31/2013  . Alcohol abuse [F10.10] 12/29/2013  . Mild persistent asthma in adult without complication [T24.58] 09/98/3382  . Smoking greater than 20 pack years [F17.210] 12/29/2013    Total Time spent with patient: 45 minutes  Subjective:   Riley Kim is a 39 y.o. male patient admitted with threats towards wife while drinking.  HPI:  Riley Kim says he was drinking and arguing with his wife.  Denies making any threats to hurt her or himself.  Says he would never hurt himself or anyone else.  From his standpoint he does not need to be here. His wife supported the story that she believes he will not her or himself. HPI Elements:   Location:  situational . Quality:  intoxicated. Severity:  reportedly making threats towards his wife. Timing:  as above. Duration:  one day. Context:  drinking.  Past Medical History:  Past Medical History  Diagnosis Date  . Asthma   . Vertigo   . 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 Arthritis     "right foot, I broke it before"    Past Surgical History  Procedure Laterality Date  . Replantation thumb Right 2000   Family History: History reviewed. No pertinent family history. Social History:  History  Alcohol Use  . 4.8 oz/week  . 7 Cans of beer, 1 Shots of liquor per week    Comment: Drink daily.     History  Drug Use No    Comment:  "last marijuana was ~ 2012" (12/29/2013)    History   Social History  . Marital Status: Married    Spouse Name: N/A  . Number of Children: N/A  . Years of Education: N/A   Occupational History  .  [employer]   Social History Main Topics  . Smoking status: Current Every Day Smoker -- 1.00 packs/day for 20 years    Types: Cigarettes  . Smokeless tobacco: Never Used     Comment: cutting back  . Alcohol Use: 4.8 oz/week    7 Cans of beer, 1 Shots of liquor per week     Comment: Drink daily.  . Drug Use: No     Comment: "last marijuana was ~ 2012" (12/29/2013)  . Sexual Activity: Not on file   Other Topics Concern  . None   Social History Narrative   Additional Social History:    Pain Medications: See PTA List Prescriptions: See PTA List Over the Counter: See PTA List History of alcohol / drug use?: Yes Longest period of sobriety (when/how long): UTA Negative Consequences of Use: Personal relationships, Legal Withdrawal Symptoms: DTs Name of Substance 1: Etoh 1 - Age of First Use: UTA 1 - Amount (size/oz): "a pint" 1 - Frequency: Daily 1 - Duration: Years 1 - Last Use / Amount: Yesterday, 08/02/14                   Allergies:   Allergies  Allergen Reactions  .  Penicillins Other (See Comments)    Rxn: unknown  . Lisinopril Swelling    Labs:  Results for orders placed or performed during the hospital encounter of 08/02/14 (from the past 48 hour(s))  Drug screen panel, emergency     Status: None   Collection Time: 08/02/14  7:39 PM  Result Value Ref Range   Opiates NONE DETECTED NONE DETECTED   Cocaine NONE DETECTED NONE DETECTED   Benzodiazepines NONE DETECTED NONE DETECTED   Amphetamines NONE DETECTED NONE DETECTED   Tetrahydrocannabinol NONE DETECTED NONE DETECTED   Barbiturates NONE DETECTED NONE DETECTED    Comment:        DRUG SCREEN FOR MEDICAL PURPOSES ONLY.  IF CONFIRMATION IS NEEDED FOR ANY PURPOSE, NOTIFY LAB WITHIN 5 DAYS.        LOWEST  DETECTABLE LIMITS FOR URINE DRUG SCREEN Drug Class       Cutoff (ng/mL) Amphetamine      1000 Barbiturate      200 Benzodiazepine   379 Tricyclics       024 Opiates          300 Cocaine          300 THC              50   CBC WITH DIFFERENTIAL     Status: Abnormal   Collection Time: 08/02/14  8:40 PM  Result Value Ref Range   WBC 8.3 4.0 - 10.5 K/uL   RBC 5.58 4.22 - 5.81 MIL/uL   Hemoglobin 14.1 13.0 - 17.0 g/dL   HCT 42.5 39.0 - 52.0 %   MCV 76.2 (L) 78.0 - 100.0 fL   MCH 25.3 (L) 26.0 - 34.0 pg   MCHC 33.2 30.0 - 36.0 g/dL   RDW 14.8 11.5 - 15.5 %   Platelets 221 150 - 400 K/uL   Neutrophils Relative % 51 43 - 77 %   Neutro Abs 4.2 1.7 - 7.7 K/uL   Lymphocytes Relative 36 12 - 46 %   Lymphs Abs 3.0 0.7 - 4.0 K/uL   Monocytes Relative 9 3 - 12 %   Monocytes Absolute 0.7 0.1 - 1.0 K/uL   Eosinophils Relative 2 0 - 5 %   Eosinophils Absolute 0.2 0.0 - 0.7 K/uL   Basophils Relative 2 (H) 0 - 1 %   Basophils Absolute 0.2 (H) 0.0 - 0.1 K/uL  Comprehensive metabolic panel     Status: Abnormal   Collection Time: 08/02/14  8:40 PM  Result Value Ref Range   Sodium 139 135 - 145 mmol/L   Potassium 3.9 3.5 - 5.1 mmol/L   Chloride 102 96 - 112 mmol/L   CO2 22 19 - 32 mmol/L   Glucose, Bld 90 70 - 99 mg/dL   BUN 10 6 - 23 mg/dL   Creatinine, Ser 0.72 0.50 - 1.35 mg/dL   Calcium 9.5 8.4 - 10.5 mg/dL   Total Protein 8.9 (H) 6.0 - 8.3 g/dL   Albumin 4.6 3.5 - 5.2 g/dL   AST 169 (H) 0 - 37 U/L   ALT 80 (H) 0 - 53 U/L   Alkaline Phosphatase 62 39 - 117 U/L   Total Bilirubin 0.5 0.3 - 1.2 mg/dL   GFR calc non Af Amer >90 >90 mL/min   GFR calc Af Amer >90 >90 mL/min    Comment: (NOTE) The eGFR has been calculated using the CKD EPI equation. This calculation has not been validated in all clinical situations. eGFR's persistently <90  mL/min signify possible Chronic Kidney Disease.    Anion gap 15 5 - 15  Ethanol     Status: Abnormal   Collection Time: 08/02/14  8:40 PM  Result  Value Ref Range   Alcohol, Ethyl (B) 254 (H) 0 - 9 mg/dL    Comment:        LOWEST DETECTABLE LIMIT FOR SERUM ALCOHOL IS 11 mg/dL FOR MEDICAL PURPOSES ONLY   Acetaminophen level     Status: Abnormal   Collection Time: 08/02/14  8:40 PM  Result Value Ref Range   Acetaminophen (Tylenol), Serum <10.0 (L) 10 - 30 ug/mL    Comment:        THERAPEUTIC CONCENTRATIONS VARY SIGNIFICANTLY. A RANGE OF 10-30 ug/mL MAY BE AN EFFECTIVE CONCENTRATION FOR MANY PATIENTS. HOWEVER, SOME ARE BEST TREATED AT CONCENTRATIONS OUTSIDE THIS RANGE. ACETAMINOPHEN CONCENTRATIONS >150 ug/mL AT 4 HOURS AFTER INGESTION AND >50 ug/mL AT 12 HOURS AFTER INGESTION ARE OFTEN ASSOCIATED WITH TOXIC REACTIONS.   Salicylate level     Status: None   Collection Time: 08/02/14  8:40 PM  Result Value Ref Range   Salicylate Lvl <4.0 2.8 - 20.0 mg/dL  CBG monitoring, ED     Status: None   Collection Time: 08/02/14  8:42 PM  Result Value Ref Range   Glucose-Capillary 87 70 - 99 mg/dL    Vitals: Blood pressure 123/72, pulse 86, temperature 98.7 F (37.1 C), temperature source Oral, resp. rate 18, SpO2 99 %.  Risk to Self: Suicidal Ideation: No Suicidal Intent: No Is patient at risk for suicide?: No Suicidal Plan?: No Access to Means: No What has been your use of drugs/alcohol within the last 12 months?: Daily alcohol use How many times?: 0 Other Self Harm Risks: Etoh addiction Intentional Self Injurious Behavior: None Risk to Others: Homicidal Ideation: No-Not Currently/Within Last 6 Months Thoughts of Harm to Others: No-Not Currently Present/Within Last 6 Months Current Homicidal Intent: No-Not Currently/Within Last 6 Months Current Homicidal Plan: No-Not Currently/Within Last 6 Months Access to Homicidal Means: No Identified Victim: Wife History of harm to others?: Yes Assessment of Violence: On admission Violent Behavior Description: Pt very irritable on admission and making threats towards wife Does  patient have access to weapons?: No Criminal Charges Pending?: Yes Describe Pending Criminal Charges: DUI Does patient have a court date: Yes Court Date: 08/28/14 Prior Inpatient Therapy: Prior Inpatient Therapy: No Prior Outpatient Therapy: Prior Outpatient Therapy: No  Current Facility-Administered Medications  Medication Dose Route Frequency Provider Last Rate Last Dose  . acetaminophen (TYLENOL) tablet 650 mg  650 mg Oral Q4H PRN Margarita Mail, PA-C      . alum & mag hydroxide-simeth (MAALOX/MYLANTA) 200-200-20 MG/5ML suspension 30 mL  30 mL Oral PRN Margarita Mail, PA-C      . ibuprofen (ADVIL,MOTRIN) tablet 600 mg  600 mg Oral Q8H PRN Margarita Mail, PA-C      . LORazepam (ATIVAN) injection 0-4 mg  0-4 mg Intravenous 4 times per day Margarita Mail, PA-C      . LORazepam (ATIVAN) injection 0-4 mg  0-4 mg Intravenous Q12H Margarita Mail, PA-C   0 mg at 08/02/14 2120  . LORazepam (ATIVAN) tablet 0-4 mg  0-4 mg Oral 4 times per day Margarita Mail, PA-C      . LORazepam (ATIVAN) tablet 0-4 mg  0-4 mg Oral Q12H Margarita Mail, PA-C   0 mg at 08/02/14 2120  . nicotine (NICODERM CQ - dosed in mg/24 hours) patch 21 mg  21 mg Transdermal  Daily Margarita Mail, PA-C   21 mg at 08/03/14 1024  . ondansetron (ZOFRAN) tablet 4 mg  4 mg Oral Q8H PRN Margarita Mail, PA-C      . thiamine (B-1) injection 100 mg  100 mg Intravenous Daily Abigail Harris, PA-C      . thiamine (VITAMIN B-1) tablet 100 mg  100 mg Oral Daily Margarita Mail, PA-C       Current Outpatient Prescriptions  Medication Sig Dispense Refill  . albuterol (PROVENTIL HFA;VENTOLIN HFA) 108 (90 BASE) MCG/ACT inhaler Inhale 2 puffs into the lungs every 6 (six) hours as needed for wheezing or shortness of breath. 1 Inhaler 2  . folic acid (FOLVITE) 1 MG tablet Take 1 tablet (1 mg total) by mouth daily. 30 tablet 11  . hydrochlorothiazide (HYDRODIURIL) 25 MG tablet Take 25 mg by mouth daily.    . ondansetron (ZOFRAN) 4 MG tablet Take 1  tablet (4 mg total) by mouth every 6 (six) hours. 12 tablet 0  . oxyCODONE-acetaminophen (PERCOCET/ROXICET) 5-325 MG per tablet Take 1 tablet by mouth daily as needed for moderate pain. 30 tablet 0  . promethazine (PHENERGAN) 25 MG suppository Place 1 suppository (25 mg total) rectally every 6 (six) hours as needed for nausea. (Patient not taking: Reported on 08/03/2014) 12 each 0  . [DISCONTINUED] lisinopril (PRINIVIL,ZESTRIL) 10 MG tablet Take 1 tablet (10 mg total) by mouth daily. 30 tablet 3    Musculoskeletal: Strength & Muscle Tone: within normal limits Gait & Station: normal Patient leans: N/A  Psychiatric Specialty Exam: Physical Exam  ROS  Blood pressure 123/72, pulse 86, temperature 98.7 F (37.1 C), temperature source Oral, resp. rate 18, SpO2 99 %.There is no weight on file to calculate BMI.  General Appearance: Casual  Eye Contact::  Good  Speech:  Clear and Coherent  Volume:  Normal  Mood:  Anxious  Affect:  Appropriate  Thought Process:  Coherent and Logical  Orientation:  Full (Time, Place, and Person)  Thought Content:  Negative  Suicidal Thoughts:  No  Homicidal Thoughts:  No  Memory:  Immediate;   Good Recent;   Good Remote;   Good  Judgement:  Intact  Insight:  Good  Psychomotor Activity:  Normal  Concentration:  Good  Recall:  Good  Fund of Knowledge:Good  Language: Good  Akathisia:  Negative  Handed:  Right  AIMS (if indicated):     Assets:  Communication Skills Desire for Improvement Financial Resources/Insurance [place] Talents/Skills Transportation Vocational/Educational  ADL's:  Intact  Cognition: WNL  Sleep:      Medical Decision Making: Established Problem, Stable/Improving (1)  Treatment Plan Summary: discharge home  Plan:  No evidence of imminent risk to self or others at present.   Disposition: discharge home to be followed outpatient as he chooses  Clarene Reamer 08/03/2014 12:33 PM

## 2014-08-03 NOTE — BHH Counselor (Signed)
TT[facility] Counselor [facility] and asked for [facility]assessment [facility] to [facility]. Venita informed counselor that pt is irritable and may not be willing to talk much, but we will attempt assessment momentarily.   Cyndie MullAnna Cobi Aldape, [facility] Triage Specialist

## 2014-08-03 NOTE — BHH Suicide Risk Assessment (Signed)
[facility]   Demographic Factors:  39 year old male  Total Time spent with patient: 45 minutes  Musculoskeletal: Strength & Muscle Tone: within normal limits Gait & Station: normal Patient leans: N/A  Psychiatric Specialty Exam: Physical Exam  ROS  Blood pressure 123/72, pulse 86, temperature 98.7 F (37.1 C), temperature source Oral, resp. rate 18, SpO2 99 %.There is no weight on file to calculate BMI.  General Appearance: Fairly Groomed  Patent attorneyye Contact::  Good  Speech:  Clear and Coherent409  Volume:  Normal  Mood:  Euthymic  Affect:  Appropriate  Thought Process:  Coherent and Logical  Orientation:  Full (Time, Place, and Person)  Thought Content:  Negative  Suicidal Thoughts:  No  Homicidal Thoughts:  No  Memory:  Immediate;   Good Recent;   Good Remote;   Good  Judgement:  Intact  Insight:  Fair  Psychomotor Activity:  Normal  Concentration:  Good  Recall:  Good  Fund of Knowledge:Good  Language: Good  Akathisia:  Negative  Handed:  Right  AIMS (if indicated):     Assets:  Communication Skills Housing  Sleep:     Cognition: WNL  ADL's:  Intact      Has this patient used any form of tobacco in the last 30 days? (Cigarettes, Smokeless Tobacco, Cigars, and/or Pipes) N/A  Mental Status Per Nursing Assessment::   On Admission:     Current Mental Status by Physician: NA  Loss Factors: NA  Historical Factors: NA  Risk Reduction Factors:   NA  Continued Clinical Symptoms:  Alcohol/Substance Abuse/Dependencies  Cognitive Features That Contribute To Risk:  Closed-mindedness    Suicide Risk:  Minimal: No identifiable suicidal ideation.  Patients presenting with no risk factors but with morbid ruminations; may be classified as minimal risk based on the severity of the depressive symptoms  Principal Problem: <principal problem not specified> Discharge Diagnoses:  Patient Active Problem List   Diagnosis Date Noted  .  Elevated transaminase level [R74.0]   . Elevated bilirubin [R17]   . Alcoholic pancreatitis [K85.2] 62/13/086511/05/2013  . Essential hypertension [I10] 12/31/2013  . Alcohol abuse [F10.10] 12/29/2013  . Mild persistent asthma in adult without complication [J45.30] 12/29/2013  . Smoking greater than 20 pack years [F17.210] 12/29/2013      Plan Of Care/Follow-up recommendations:  Activity:  resume usual activity Diet:  resume usual diet  Is patient on multiple antipsychotic therapies at discharge:  No   Has Patient had three or more failed trials of antipsychotic monotherapy by history:  No  Recommended Plan for Multiple Antipsychotic Therapies: NA    TAYLOR,GERALD D 08/03/2014, 12:44 PM

## 2014-08-03 NOTE — [facility] Counselor (Signed)
Per Donell SievertSpencer Simon, PA, pt to be re-evaluated by psychiatry in the morning to uphold or rescind IVC.   Counselor informed [place] [facility] [place] [facility] [place].   Cyndie MullAnna Kerim Statzer, [facility] Triage Specialist

## 2014-08-08 ENCOUNTER — Telehealth: Payer: Self-pay | Admitting: Internal Medicine

## 2014-08-08 NOTE — Telephone Encounter (Signed)
Call to patient to confirm appointment for 08/09/14 at 1:45 no answer

## 2014-08-08 NOTE — Telephone Encounter (Signed)
Attempted to leave message, unsure if voicemail recorded.  Mr. Riley Kim has an appointment on 08/09/14 at 1:45.  CSW will either meet with Mr. Riley Kim during appointment or mail information out.

## 2014-08-09 ENCOUNTER — Telehealth: Payer: Self-pay | Admitting: Internal Medicine

## 2014-08-09 ENCOUNTER — Other Ambulatory Visit: Payer: Self-pay | Admitting: Internal Medicine

## 2014-08-09 ENCOUNTER — Encounter: Payer: Self-pay | Admitting: Internal Medicine

## 2014-08-09 ENCOUNTER — Ambulatory Visit: Payer: Self-pay | Admitting: Internal Medicine

## 2014-08-09 NOTE — Telephone Encounter (Signed)
Pt calls and states he does not have the money for copays but has the money for his percocet, he just wants the prescription and does not want to have to pay for a visit, he states he has not spoken with deborah hill because she didn't call him, he is advised to call ms hill for evaluation of financial needs and reschedule his appointment and that he was given a 30 day supply of percocet 3/23, he states he was sure the doctor told him to take more than 1 daily

## 2014-08-14 NOTE — Telephone Encounter (Signed)
Pt no showed his visit.  [employer] mailed.

## 2014-08-17 NOTE — Telephone Encounter (Signed)
I prescribed oxycodone-APAP 5-325 mg daily prn for pain. I told him these instructions and they were written on the bottle. I prescribed #30 pills. Patient has told me in the past that he only needs one pill every once in a while. I do not feel comfortable refilling this prescription earlier without being seen as he was taking more than prescribed and has a recent history of suicidal and homicidal ideation seen in the ED earlier this month. Also, if patient is in significantly more pain than before, this needs to be addressed in an appointment. Patient can schedule an appointment to be seen.

## 2014-09-05 ENCOUNTER — Other Ambulatory Visit: Payer: Self-pay | Admitting: Internal Medicine

## 2014-09-08 ENCOUNTER — Emergency Department (HOSPITAL_COMMUNITY): Payer: No Typology Code available for payment source

## 2014-09-08 ENCOUNTER — Emergency Department (HOSPITAL_COMMUNITY)
Admission: EM | Admit: 2014-09-08 | Discharge: 2014-09-08 | Disposition: A | Discharge status: Home / Self Care | Payer: No Typology Code available for payment source | Attending: Emergency Medicine | Admitting: Emergency Medicine

## 2014-09-08 ENCOUNTER — Encounter (HOSPITAL_COMMUNITY): Payer: Self-pay | Admitting: Emergency Medicine

## 2014-09-08 DIAGNOSIS — Y999 Unspecified external cause status: Secondary | ICD-10-CM | POA: Insufficient documentation

## 2014-09-08 DIAGNOSIS — M79641 Pain in right hand: Secondary | ICD-10-CM

## 2014-09-08 DIAGNOSIS — S0012XA Contusion of left eyelid and periocular area, initial encounter: Secondary | ICD-10-CM | POA: Diagnosis not present

## 2014-09-08 DIAGNOSIS — S59911A Unspecified injury of right forearm, initial encounter: Secondary | ICD-10-CM | POA: Diagnosis not present

## 2014-09-08 DIAGNOSIS — M1990 Unspecified osteoarthritis, unspecified site: Secondary | ICD-10-CM | POA: Insufficient documentation

## 2014-09-08 DIAGNOSIS — S0992XA Unspecified injury of nose, initial encounter: Secondary | ICD-10-CM | POA: Diagnosis present

## 2014-09-08 DIAGNOSIS — Y939 Activity, unspecified: Secondary | ICD-10-CM | POA: Insufficient documentation

## 2014-09-08 DIAGNOSIS — J45909 Unspecified asthma, uncomplicated: Secondary | ICD-10-CM | POA: Diagnosis not present

## 2014-09-08 DIAGNOSIS — S022XXA Fracture of nasal bones, initial encounter for closed fracture: Secondary | ICD-10-CM | POA: Diagnosis not present

## 2014-09-08 DIAGNOSIS — S0990XA Unspecified injury of head, initial encounter: Secondary | ICD-10-CM

## 2014-09-08 DIAGNOSIS — T148XXA Other injury of unspecified body region, initial encounter: Secondary | ICD-10-CM

## 2014-09-08 DIAGNOSIS — Z720 Tobacco use: Secondary | ICD-10-CM | POA: Insufficient documentation

## 2014-09-08 DIAGNOSIS — Z880 Allergy status to penicillin: Secondary | ICD-10-CM | POA: Insufficient documentation

## 2014-09-08 DIAGNOSIS — S6991XA Unspecified injury of right wrist, hand and finger(s), initial encounter: Secondary | ICD-10-CM | POA: Insufficient documentation

## 2014-09-08 DIAGNOSIS — Z79899 Other long term (current) drug therapy: Secondary | ICD-10-CM | POA: Insufficient documentation

## 2014-09-08 DIAGNOSIS — S0081XA Abrasion of other part of head, initial encounter: Secondary | ICD-10-CM | POA: Diagnosis not present

## 2014-09-08 DIAGNOSIS — Y92410 Unspecified street and highway as the place of occurrence of the external cause: Secondary | ICD-10-CM | POA: Insufficient documentation

## 2014-09-08 MED ORDER — ONDANSETRON 4 MG PO TBDP
8.0000 mg | ORAL_TABLET | Freq: Once | ORAL | Status: AC
  Administered 2014-09-08: 8 mg via ORAL
  Filled 2014-09-08: qty 2

## 2014-09-08 MED ORDER — OXYCODONE-ACETAMINOPHEN 5-325 MG PO TABS
1.0000 | ORAL_TABLET | Freq: Once | ORAL | Status: AC
  Administered 2014-09-08: 1 via ORAL
  Filled 2014-09-08: qty 1
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
## 2014-09-08 NOTE — ED Notes (Signed)
Pt admits to drinking 2 24oz beers tonight.
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
## 2014-09-08 NOTE — ED Notes (Addendum)
Pt refused to wear c-collar, stating "My neck ain't broke, I can move it!".

## 2014-09-21 ENCOUNTER — Telehealth: Payer: Self-pay | Admitting: *Deleted

## 2014-09-21 ENCOUNTER — Ambulatory Visit: Payer: Self-pay | Admitting: Internal Medicine

## 2014-09-21 NOTE — Telephone Encounter (Signed)
i have attempted to call pt and reschedule an appt, no answer, no vmail
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
## 2014-09-21 NOTE — Telephone Encounter (Signed)
I see that he has probably missed his appointment (not showed for 145pm appt), can you call him and try to reschedule?   Thanks

## 2014-10-09 ENCOUNTER — Encounter (HOSPITAL_COMMUNITY): Payer: Self-pay | Admitting: Emergency Medicine

## 2014-10-09 DIAGNOSIS — Z79899 Other long term (current) drug therapy: Secondary | ICD-10-CM | POA: Insufficient documentation

## 2014-10-09 DIAGNOSIS — R1013 Epigastric pain: Secondary | ICD-10-CM | POA: Insufficient documentation

## 2014-10-09 DIAGNOSIS — Z880 Allergy status to penicillin: Secondary | ICD-10-CM | POA: Insufficient documentation

## 2014-10-09 DIAGNOSIS — Z8719 Personal history of other diseases of the digestive system: Secondary | ICD-10-CM | POA: Insufficient documentation

## 2014-10-09 DIAGNOSIS — R112 Nausea with vomiting, unspecified: Secondary | ICD-10-CM | POA: Insufficient documentation

## 2014-10-09 DIAGNOSIS — Z8739 Personal history of other diseases of the musculoskeletal system and connective tissue: Secondary | ICD-10-CM | POA: Insufficient documentation

## 2014-10-09 DIAGNOSIS — Z720 Tobacco use: Secondary | ICD-10-CM | POA: Insufficient documentation

## 2014-10-09 DIAGNOSIS — J45909 Unspecified asthma, uncomplicated: Secondary | ICD-10-CM | POA: Insufficient documentation

## 2014-10-09 LAB — COMPREHENSIVE METABOLIC PANEL
ALT: 56 U/L (ref 17–63)
AST: 173 U/L — ABNORMAL HIGH (ref 15–41)
Albumin: 4.3 g/dL (ref 3.5–5.0)
Alkaline Phosphatase: 65 U/L (ref 38–126)
Anion gap: 16 — ABNORMAL HIGH (ref 5–15)
BILIRUBIN TOTAL: 1.1 mg/dL (ref 0.3–1.2)
BUN: <5 mg/dL — ABNORMAL LOW (ref 6–20)
CALCIUM: 9.7 mg/dL (ref 8.9–10.3)
CHLORIDE: 95 mmol/L — AB (ref 101–111)
CO2: 23 mmol/L (ref 22–32)
Creatinine, Ser: 0.67 mg/dL (ref 0.61–1.24)
GFR calc Af Amer: >60 mL/min (ref >60)
GFR calc non Af Amer: >60 mL/min (ref >60)
Glucose, Bld: 106 mg/dL — ABNORMAL HIGH (ref 65–99)
Potassium: 3.5 mmol/L (ref 3.5–5.1)
Sodium: 134 mmol/L — ABNORMAL LOW (ref 135–145)
TOTAL PROTEIN: 8.1 g/dL (ref 6.5–8.1)

## 2014-10-09 LAB — CBC WITH DIFFERENTIAL/PLATELET
BASOS ABS: 0.1 10*3/uL (ref 0.0–0.1)
BASOS PCT: 1 % (ref 0–1)
Eosinophils Absolute: 0.3 10*3/uL (ref 0.0–0.7)
Eosinophils Relative: 5 % (ref 0–5)
HEMATOCRIT: 40.9 % (ref 39.0–52.0)
Hemoglobin: 13.8 g/dL (ref 13.0–17.0)
LYMPHS PCT: 34 % (ref 12–46)
Lymphs Abs: 2.2 10*3/uL (ref 0.7–4.0)
MCH: 25.1 pg — AB (ref 26.0–34.0)
MCHC: 33.7 g/dL (ref 30.0–36.0)
MCV: 74.4 fL — ABNORMAL LOW (ref 78.0–100.0)
MONO ABS: 0.8 10*3/uL (ref 0.1–1.0)
Monocytes Relative: 13 % — ABNORMAL HIGH (ref 3–12)
NEUTROS PCT: 47 % (ref 43–77)
Neutro Abs: 3.1 10*3/uL (ref 1.7–7.7)
PLATELETS: 185 10*3/uL (ref 150–400)
RBC: 5.5 MIL/uL (ref 4.22–5.81)
RDW: 14.3 % (ref 11.5–15.5)
WBC: 6.5 10*3/uL (ref 4.0–10.5)

## 2014-10-09 LAB — LIPASE, BLOOD: Lipase: 100 U/L — ABNORMAL HIGH (ref 22–51)

## 2014-10-09 LAB — URINALYSIS, ROUTINE W REFLEX MICROSCOPIC
Glucose, UA: NEGATIVE mg/dL
Hgb urine dipstick: NEGATIVE
Ketones, ur: 15 mg/dL — AB
Nitrite: POSITIVE — AB
PH: 6.5 (ref 5.0–8.0)
Protein, ur: 30 mg/dL — AB
Specific Gravity, Urine: 1.039 — ABNORMAL HIGH (ref 1.005–1.030)
Urobilinogen, UA: 2 mg/dL — ABNORMAL HIGH (ref 0.0–1.0)

## 2014-10-09 LAB — URINE MICROSCOPIC-ADD ON

## 2014-10-09 NOTE — ED Notes (Signed)
Pt. arrived with EMS from home reports pancreatitis /generalized abdominal pain and upper back pain onset last week , drank ETOH with emesis today  , denies diarrhea or fever .

## 2014-10-10 ENCOUNTER — Emergency Department (HOSPITAL_COMMUNITY)
Admission: EM | Admit: 2014-10-10 | Discharge: 2014-10-10 | Disposition: A | Discharge status: Home / Self Care | Payer: Self-pay | Attending: Emergency Medicine | Admitting: Emergency Medicine

## 2014-10-10 DIAGNOSIS — R1013 Epigastric pain: Secondary | ICD-10-CM

## 2014-10-10 LAB — HIV ANTIBODY (ROUTINE TESTING W REFLEX): HIV Screen 4th Generation wRfx: NONREACTIVE

## 2014-10-10 LAB — RPR: RPR Ser Ql: NONREACTIVE

## 2014-10-10 MED ORDER — ONDANSETRON 4 MG PO TBDP: 4.0000 mg | ORAL_TABLET | Freq: Three times a day (TID) | ORAL | Status: DC | PRN

## 2014-10-10 MED ORDER — KETOROLAC TROMETHAMINE 30 MG/ML IJ SOLN
30.0000 mg | Freq: Once | INTRAMUSCULAR | Status: AC
  Administered 2014-10-10: 30 mg via INTRAVENOUS
  Filled 2014-10-10: qty 1

## 2014-10-10 MED ORDER — DEXTROSE 5 % IV SOLN
1.0000 g | Freq: Once | INTRAVENOUS | Status: AC
  Administered 2014-10-10: 1 g via INTRAVENOUS
  Filled 2014-10-10: qty 10

## 2014-10-10 MED ORDER — SUCRALFATE 1 G PO TABS: 1.0000 g | ORAL_TABLET | Freq: Three times a day (TID) | ORAL | Status: DC

## 2014-10-10 MED ORDER — SODIUM CHLORIDE 0.9 % IV BOLUS (SEPSIS)
1000.0000 mL | Freq: Once | INTRAVENOUS | Status: AC
  Administered 2014-10-10: 1000 mL via INTRAVENOUS

## 2014-10-10 MED ORDER — ONDANSETRON HCL 4 MG/2ML IJ SOLN
4.0000 mg | Freq: Once | INTRAMUSCULAR | Status: AC
  Administered 2014-10-10: 4 mg via INTRAVENOUS
  Filled 2014-10-10: qty 2

## 2014-10-10 MED ORDER — SUCRALFATE 1 G PO TABS
1.0000 g | ORAL_TABLET | Freq: Once | ORAL | Status: AC
  Administered 2014-10-10: 1 g via ORAL
  Filled 2014-10-10: qty 1

## 2014-10-10 NOTE — ED Notes (Signed)
Dr. Horton at the bedside.  

## 2014-10-10 NOTE — ED Provider Notes (Signed)
CSN: [id number]     Arrival date & time 10/09/14  2212 History  This chart was scribed for Shon Baton, MD by Annye Asa, ED Scribe. This patient was seen in room A05C/A05C and the patient's care was started at 12:31 AM.    Chief Complaint  Patient presents with  . Abdominal Pain   The history is provided by the patient. No language interpreter was used.     HPI Comments: Riley Kim is a 39 y.o. male with past medical history of alcohol-induced pancreatitis brought in by ambulance who presents to the Emergency Department complaining of 8 days of generalized abdominal pain and upper back pain, currently rated 9/10 and exacerbated with applied pressure. He also reports one episode of vomiting. Patient states he has been drinking EtOH throughout the past week; "I've been in jail so I haven't been drinking a lot, but I've had a bit throughout the week." He reports taking tylenol for his pain which provided only transient relief. He denies diarrhea, fever, dysuria, hematuria, penile discharge. He denies recent sexual activity.   Past Medical History  Diagnosis Date  . Asthma   . Vertigo   . 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 Arthritis     "right foot, I broke it before"   Past Surgical History  Procedure Laterality Date  . Replantation thumb Right 2000   No family history on file. History  Substance Use Topics  . Smoking status: Current Every Day Smoker -- 0.00 packs/day for 0 years    Types: Cigarettes  . Smokeless tobacco: Never Used     Comment: cutting back  . Alcohol Use: Yes     Comment: Drink daily.    Review of Systems  Constitutional: Negative.  Negative for fever.  Respiratory: Negative.  Negative for chest tightness and shortness of breath.   Cardiovascular: Negative.  Negative for chest pain.  Gastrointestinal: Positive for nausea, vomiting and abdominal pain. Negative for diarrhea.  Genitourinary: Negative.   Negative for dysuria, discharge and penile pain.  Musculoskeletal: Negative for back pain.  Neurological: Negative for headaches.  All other systems reviewed and are negative.     Allergies  Penicillins and Lisinopril  Home Medications   Prior to Admission medications   Medication Sig Start Date End Date Taking? Authorizing Provider  acetaminophen (TYLENOL) 500 MG tablet Take 500 mg by mouth every 6 (six) hours as needed for moderate pain.   Yes Historical Provider, MD  OVER THE COUNTER MEDICATION Take 2 tablets by mouth at bedtime as needed (For Sleep).   Yes Historical Provider, MD  albuterol (PROVENTIL HFA;VENTOLIN HFA) 108 (90 BASE) MCG/ACT inhaler Inhale 2 puffs into the lungs every 6 (six) hours as needed for wheezing or shortness of breath. Patient not taking: Reported on 10/10/2014 07/26/14   Alexa Dulcy Fanny, MD  folic acid (FOLVITE) 1 MG tablet Take 1 tablet (1 mg total) by mouth daily. Patient not taking: Reported on 10/10/2014 07/26/14   Alexa Dulcy Fanny, MD  hydrochlorothiazide (HYDRODIURIL) 25 MG tablet Take 25 mg by mouth daily.    Historical Provider, MD  ondansetron (ZOFRAN ODT) 4 MG disintegrating tablet Take 1 tablet (4 mg total) by mouth every 8 (eight) hours as needed for nausea or vomiting. 10/10/14   Shon Baton, MD  ondansetron (ZOFRAN) 4 MG tablet Take 1 tablet (4 mg total) by mouth every 6 (six) hours. Patient not taking: Reported on 10/10/2014 07/26/14  Alexa Dulcy Fanny, MD  oxyCODONE-acetaminophen (PERCOCET/ROXICET) 5-325 MG per tablet Take 1 tablet by mouth daily as needed for moderate pain. Patient not taking: Reported on 10/10/2014 07/26/14   Alexa Dulcy Fanny, MD  promethazine (PHENERGAN) 25 MG suppository Place 1 suppository (25 mg total) rectally every 6 (six) hours as needed for nausea. Patient not taking: Reported on 08/03/2014 06/04/14   Derwood Kaplan, MD  sucralfate (CARAFATE) 1 G tablet Take 1 tablet (1 g total) by mouth 4 (four) times daily -  with  meals and at bedtime. 10/10/14   Shon Baton, MD   BP 118/75 mmHg  Pulse 84  Temp(Src) 97.7 F (36.5 C) (Oral)  Resp 22  Ht  (1.88 m)  Wt 222 lb (100.699 kg)  BMI 28.49 kg/m2  SpO2 94% Physical Exam  Constitutional: He is oriented to person, place, and time. He appears well-developed and well-nourished. No distress.  HENT:  Head: Normocephalic and atraumatic.  Cardiovascular: Normal rate, regular rhythm and normal heart sounds.   No murmur heard. Pulmonary/Chest: Effort normal and breath sounds normal. No respiratory distress. He has no wheezes.  Abdominal: Soft. Bowel sounds are normal. There is tenderness. There is no rebound and no guarding.  Mild tenderness in the epigastrium without rebound or guarding  Musculoskeletal: He exhibits no edema.  Neurological: He is alert and oriented to person, place, and time.  Skin: Skin is warm and dry.  Psychiatric: He has a normal mood and affect.  Nursing note and vitals reviewed.   ED Course  Procedures   DIAGNOSTIC STUDIES: Oxygen Saturation is 96% on RA, adequate by my interpretation.    COORDINATION OF CARE: 12:34 AM Discussed treatment plan with pt at bedside and pt agreed to plan.   Labs Review Labs Reviewed  CBC WITH DIFFERENTIAL/PLATELET - Abnormal; Notable for the following:    MCV 74.4 (*)    MCH 25.1 (*)    Monocytes Relative 13 (*)    All other components within normal limits  COMPREHENSIVE METABOLIC PANEL - Abnormal; Notable for the following:    Sodium 134 (*)    Chloride 95 (*)    Glucose, Bld 106 (*)    BUN <5 (*)    AST 173 (*)    Anion gap 16 (*)    All other components within normal limits  LIPASE, BLOOD - Abnormal; Notable for the following:    Lipase 100 (*)    All other components within normal limits  URINALYSIS, ROUTINE W REFLEX MICROSCOPIC (NOT AT [facility]) - Abnormal; Notable for the following:    Color, Urine ORANGE (*)    APPearance HAZY (*)    Specific Gravity, Urine 1.039 (*)     Bilirubin Urine MODERATE (*)    Ketones, ur 15 (*)    Protein, ur 30 (*)    Urobilinogen, UA 2.0 (*)    Nitrite POSITIVE (*)    Leukocytes, UA SMALL (*)    All other components within normal limits  URINE MICROSCOPIC-ADD ON - Abnormal; Notable for the following:    Bacteria, UA FEW (*)    Casts GRANULAR CAST (*)    All other components within normal limits  URINE CULTURE  RPR  HIV ANTIBODY (ROUTINE TESTING)  GC/CHLAMYDIA PROBE AMP ([place]) NOT AT [facility]    Imaging Review No results found.   EKG Interpretation None      MDM   Final diagnoses:  Epigastric pain   Patient presents with abdominal pain. Relates it to  his chronic pancreatitis. Has been drinking alcohol. Tender but no signs of peritonitis on exam. Lipase 100 which is actually improved for this patient. Vital signs initially notable for tachycardia. Patient given fluids, pain medication, nausea medication, and Carafate. Urinalysis is nitrite positive. Patient is asymptomatic.  Will send urine culture and screen for STDs. Patient given IV Rocephin.  On recheck, patient resting comfortably. Reports improvement of pain. Able to by mouth challenge without difficulty. Will discharge with Zofran and Carafate.  After history, exam, and medical workup I feel the patient has been appropriately medically screened and is safe for discharge home. Pertinent diagnoses were discussed with the patient. Patient was given return precautions.  I personally performed the services described in this documentation, which was scribed in my presence. The recorded information has been reviewed and is accurate.      Shon Batonourtney F Sarath Privott, MD 10/10/14 [phone]

## 2014-10-10 NOTE — ED Notes (Signed)
Patient denies pain and is resting comfortably.  

## 2014-10-10 NOTE — ED Notes (Signed)
Patient able to consume food as PO challenge.

## 2014-10-10 NOTE — Discharge Instructions (Signed)
You were seen today for abdominal pain. Her lab workup is reassuring. There is no evidence of acute pancreatitis. You may have a mild urinary tract infection but because you are asymptomatic, a culture has been sent and you will be informed of its results.  Abdominal Pain Many things can cause abdominal pain. Usually, abdominal pain is not caused by a disease and will improve without treatment. It can often be observed and treated at home. Your health care provider will do a physical exam and possibly order blood tests and X-rays to help determine the seriousness of your pain. However, in many cases, more time must pass before a clear cause of the pain can be found. Before that point, your health care provider may not know if you need more testing or further treatment. HOME CARE INSTRUCTIONS  Monitor your abdominal pain for any changes. The following actions may help to alleviate any discomfort you are experiencing:  Only take over-the-counter or prescription medicines as directed by your health care provider.  Do not take laxatives unless directed to do so by your health care provider.  Try a clear liquid diet (broth, tea, or water) as directed by your health care provider. Slowly move to a bland diet as tolerated. SEEK MEDICAL CARE IF:  You have unexplained abdominal pain.  You have abdominal pain associated with nausea or diarrhea.  You have pain when you urinate or have a bowel movement.  You experience abdominal pain that wakes you in the night.  You have abdominal pain that is worsened or improved by eating food.  You have abdominal pain that is worsened with eating fatty foods.  You have a fever. SEEK IMMEDIATE MEDICAL CARE IF:   Your pain does not go away within 2 hours.  You keep throwing up (vomiting).  Your pain is felt only in portions of the abdomen, such as the right side or the left lower portion of the abdomen.  You pass bloody or black tarry stools. MAKE SURE  YOU:  Understand these instructions.   Will watch your condition.   Will get help right away if you are not doing well or get worse.  Document Released: 01/29/2005 Document Revised: 04/26/2013 Document Reviewed: 12/29/2012 [facility] Patient Information 2015 [place], [place]. This information is not intended to replace advice given to you by your health care provider. Make sure you discuss any questions you have with your health care provider.

## 2014-10-11 LAB — URINE CULTURE
Colony Count: NO GROWTH
Culture: NO GROWTH

## 2014-10-12 ENCOUNTER — Emergency Department (HOSPITAL_COMMUNITY)
Admission: EM | Admit: 2014-10-12 | Discharge: 2014-10-13 | Disposition: A | Discharge status: Home / Self Care | Payer: Self-pay | Attending: Emergency Medicine | Admitting: Emergency Medicine

## 2014-10-12 ENCOUNTER — Emergency Department (HOSPITAL_COMMUNITY): Payer: Self-pay

## 2014-10-12 ENCOUNTER — Encounter (HOSPITAL_COMMUNITY): Payer: Self-pay | Admitting: Emergency Medicine

## 2014-10-12 DIAGNOSIS — K5900 Constipation, unspecified: Secondary | ICD-10-CM | POA: Insufficient documentation

## 2014-10-12 DIAGNOSIS — Z880 Allergy status to penicillin: Secondary | ICD-10-CM | POA: Insufficient documentation

## 2014-10-12 DIAGNOSIS — Z720 Tobacco use: Secondary | ICD-10-CM | POA: Insufficient documentation

## 2014-10-12 DIAGNOSIS — K852 Alcohol induced acute pancreatitis without necrosis or infection: Secondary | ICD-10-CM

## 2014-10-12 DIAGNOSIS — J45909 Unspecified asthma, uncomplicated: Secondary | ICD-10-CM | POA: Insufficient documentation

## 2014-10-12 DIAGNOSIS — Z79899 Other long term (current) drug therapy: Secondary | ICD-10-CM | POA: Insufficient documentation

## 2014-10-12 DIAGNOSIS — M1990 Unspecified osteoarthritis, unspecified site: Secondary | ICD-10-CM | POA: Insufficient documentation

## 2014-10-12 LAB — CBC WITH DIFFERENTIAL/PLATELET
BASOS ABS: 0.1 10*3/uL (ref 0.0–0.1)
Basophils Relative: 1 % (ref 0–1)
EOS ABS: 0.2 10*3/uL (ref 0.0–0.7)
Eosinophils Relative: 2 % (ref 0–5)
HCT: 40 % (ref 39.0–52.0)
HEMOGLOBIN: 13.4 g/dL (ref 13.0–17.0)
LYMPHS ABS: 1.3 10*3/uL (ref 0.7–4.0)
Lymphocytes Relative: 16 % (ref 12–46)
MCH: 25.1 pg — AB (ref 26.0–34.0)
MCHC: 33.5 g/dL (ref 30.0–36.0)
MCV: 75 fL — ABNORMAL LOW (ref 78.0–100.0)
Monocytes Absolute: 0.8 10*3/uL (ref 0.1–1.0)
Monocytes Relative: 10 % (ref 3–12)
Neutro Abs: 6.1 10*3/uL (ref 1.7–7.7)
Neutrophils Relative %: 71 % (ref 43–77)
PLATELETS: 157 10*3/uL (ref 150–400)
RBC: 5.33 MIL/uL (ref 4.22–5.81)
RDW: 14.2 % (ref 11.5–15.5)
WBC: 8.5 10*3/uL (ref 4.0–10.5)

## 2014-10-12 MED ORDER — HYDROMORPHONE HCL 1 MG/ML IJ SOLN
0.5000 mg | Freq: Once | INTRAMUSCULAR | Status: AC
  Administered 2014-10-12: 0.5 mg via INTRAVENOUS
  Filled 2014-10-12: qty 1

## 2014-10-12 MED ORDER — SODIUM CHLORIDE 0.9 % IV BOLUS (SEPSIS)
1000.0000 mL | Freq: Once | INTRAVENOUS | Status: AC
  Administered 2014-10-12: 1000 mL via INTRAVENOUS

## 2014-10-12 NOTE — ED Notes (Signed)
Pt states he has had RUQ pain that radiates into his back and chest since 09/30/14 when he was in jail. Pt states he was seen here 3 days ago for same. Pt states," feels like my pancreatitis". Pt reports vomiting x3 today and no BM for 1 week.

## 2014-10-12 NOTE — ED Provider Notes (Signed)
CSN: [id number]     Arrival date & time 10/12/14  2241 History   First MD Initiated Contact with Patient 10/12/14 2251     Chief Complaint  Patient presents with  . Abdominal Pain     (Consider location/radiation/quality/duration/timing/severity/associated sxs/prior Treatment) HPI Riley Kim is a 39 year old male with a past medical history of alcohol-induced pancreatitis brought in by EMS and presents to the ED complaining of 11 days of abdominal pain in the right upper quadrant, left upper quadrant, radiating to his back. He states his pain is currently 10 out of 10. He states it is worse when palpating his abdomen. He reports one episode of vomiting this morning and 1 in route. He states he was seen here 3 days ago and was told to take Tylenol for his pain and Zofran for nausea. He states he has also been constipated for the past week. He states this is similar to his "pancreatitis flare ups" in the past. He denies any alcohol or drug use since his last visit 3 days ago. He denies any fever, chills, chest pain, shortness of breath, diarrhea, dysuria, hematuria.  Past Medical History  Diagnosis Date  . Asthma   . Vertigo   . 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 Arthritis     "right foot, I broke it before"   Past Surgical History  Procedure Laterality Date  . Replantation thumb Right 2000   No family history on file. History  Substance Use Topics  . Smoking status: Current Every Day Smoker -- 0.00 packs/day for 0 years    Types: Cigarettes  . Smokeless tobacco: Never Used     Comment: cutting back  . Alcohol Use: Yes     Comment: Drink daily.    Review of Systems  Constitutional: Negative for fever and chills.  Respiratory: Negative for shortness of breath.   Cardiovascular: Negative for chest pain.  Gastrointestinal: Positive for nausea, vomiting, abdominal pain and constipation.  Neurological: Negative for dizziness, syncope,  weakness, light-headedness and headaches.  All other systems reviewed and are negative.     Allergies  Penicillins and Lisinopril  Home Medications   Prior to Admission medications   Medication Sig Start Date End Date Taking? Authorizing Provider  acetaminophen (TYLENOL) 500 MG tablet Take 500 mg by mouth every 6 (six) hours as needed for moderate pain.    Historical Provider, MD  albuterol (PROVENTIL HFA;VENTOLIN HFA) 108 (90 BASE) MCG/ACT inhaler Inhale 2 puffs into the lungs every 6 (six) hours as needed for wheezing or shortness of breath. Patient not taking: Reported on 10/10/2014 07/26/14   Alexa Dulcy Fanny, MD  folic acid (FOLVITE) 1 MG tablet Take 1 tablet (1 mg total) by mouth daily. Patient not taking: Reported on 10/10/2014 07/26/14   Alexa Dulcy Fanny, MD  hydrochlorothiazide (HYDRODIURIL) 25 MG tablet Take 25 mg by mouth daily.    Historical Provider, MD  HYDROcodone-acetaminophen (NORCO/VICODIN) 5-325 MG per tablet Take 2 tablets by mouth every 4 (four) hours as needed. 10/13/14   Kingslee Mairena Patel-Mills, PA-C  ondansetron (ZOFRAN ODT) 4 MG disintegrating tablet Take 1 tablet (4 mg total) by mouth every 8 (eight) hours as needed for nausea or vomiting. 10/10/14   Shon Baton, MD  ondansetron (ZOFRAN) 4 MG tablet Take 1 tablet (4 mg total) by mouth every 6 (six) hours. Patient not taking: Reported on 10/10/2014 07/26/14   Alexa Dulcy Fanny, MD  OVER THE COUNTER MEDICATION Take  2 tablets by mouth at bedtime as needed (For Sleep).    Historical Provider, MD  promethazine (PHENERGAN) 25 MG suppository Place 1 suppository (25 mg total) rectally every 6 (six) hours as needed for nausea. Patient not taking: Reported on 08/03/2014 06/04/14   Derwood Kaplan, MD  sucralfate (CARAFATE) 1 G tablet Take 1 tablet (1 g total) by mouth 4 (four) times daily -  with meals and at bedtime. 10/10/14   Shon Baton, MD   BP 144/82 mmHg  Pulse 81  Temp(Src) 98.4 F (36.9 C) (Oral)  Resp 15  Ht   (1.88 m)  Wt 222 lb (100.699 kg)  BMI 28.49 kg/m2  SpO2 100% Physical Exam  Constitutional: He is oriented to person, place, and time. He appears well-developed and well-nourished.  HENT:  Head: Normocephalic and atraumatic.  Eyes: Conjunctivae are normal.  Neck: Normal range of motion. Neck supple.  Cardiovascular: Normal rate, regular rhythm and normal heart sounds.   Pulmonary/Chest: Effort normal and breath sounds normal.  Abdominal: Soft. Normal appearance. He exhibits no distension and no mass. There is no rebound and no guarding.    Musculoskeletal: Normal range of motion.  Neurological: He is alert and oriented to person, place, and time.  Skin: Skin is warm and dry.  Nursing note and vitals reviewed.   ED Course  Procedures (including critical care time) Labs Review Labs Reviewed  CBC WITH DIFFERENTIAL/PLATELET - Abnormal; Notable for the following:    MCV 75.0 (*)    MCH 25.1 (*)    All other components within normal limits  COMPREHENSIVE METABOLIC PANEL - Abnormal; Notable for the following:    Sodium 134 (*)    Chloride 92 (*)    AST 219 (*)    ALT 104 (*)    Anion gap 20 (*)    All other components within normal limits  LIPASE, BLOOD - Abnormal; Notable for the following:    Lipase 196 (*)    All other components within normal limits    Imaging Review Dg Abd 1 View  10/12/2014   CLINICAL DATA:  Abdominal pain and vomiting for 2 days  EXAM: ABDOMEN - 1 VIEW  COMPARISON:  CT 12/29/2013  FINDINGS: The bowel gas pattern is normal. No radio-opaque calculi or other significant radiographic abnormality are seen.  IMPRESSION: Negative.   Electronically Signed   By: [name] M.D.   On: 10/12/2014 23:49     EKG Interpretation   Date/Time:  Thursday October 12 2014 22:43:26 EDT Ventricular Rate:  92 PR Interval:  121 QRS Duration: 87 QT Interval:  372 QTC Calculation: 460 R Axis:   79 Text Interpretation:  Sinus rhythm Normal ECG Confirmed by POLLINA   MD,  CHRISTOPHER [phone]) on 10/12/2014 10:59:13 PM      MDM   Final diagnoses:  Alcohol-induced acute pancreatitis  Patient presents for abdominal pain and vomiting.  His vitals are stable, he is afebrile. Medications  sodium chloride 0.9 % bolus 1,000 mL (0 mLs Intravenous Stopped 10/13/14 0116)  HYDROmorphone (DILAUDID) injection 0.5 mg (0.5 mg Intravenous Given 10/12/14 2333)  oxyCODONE-acetaminophen (PERCOCET/ROXICET) 5-325 MG per tablet 2 tablet (2 tablets Oral Given 10/13/14 0057)  He has not vomited in the ED. He is tolerating PO fluids without nausea or vomiting. I believe this is alcohol induced pancreatitis. His LFT's are mildly elevated since his last visit as well as his lipase. His abdominal xray is negative for constipation or bowel obstruction.  He can take  miralax for constipation.  He states he has Zofran at home. I have given him 6 Percocet. He can take ibuprofen for pain.      Catha Gosselin, PA-C 10/13/14 1612  Gilda Crease, MD 10/16/14 [phone]

## 2014-10-13 DIAGNOSIS — E874 Mixed disorder of acid-base balance: Secondary | ICD-10-CM | POA: Diagnosis present

## 2014-10-13 DIAGNOSIS — K852 Alcohol induced acute pancreatitis: Principal | ICD-10-CM | POA: Diagnosis present

## 2014-10-13 DIAGNOSIS — F17210 Nicotine dependence, cigarettes, uncomplicated: Secondary | ICD-10-CM | POA: Diagnosis present

## 2014-10-13 DIAGNOSIS — J45909 Unspecified asthma, uncomplicated: Secondary | ICD-10-CM | POA: Diagnosis present

## 2014-10-13 DIAGNOSIS — I10 Essential (primary) hypertension: Secondary | ICD-10-CM | POA: Diagnosis present

## 2014-10-13 DIAGNOSIS — F1010 Alcohol abuse, uncomplicated: Secondary | ICD-10-CM | POA: Diagnosis present

## 2014-10-13 DIAGNOSIS — K5900 Constipation, unspecified: Secondary | ICD-10-CM | POA: Diagnosis present

## 2014-10-13 DIAGNOSIS — K861 Other chronic pancreatitis: Secondary | ICD-10-CM | POA: Diagnosis present

## 2014-10-13 LAB — COMPREHENSIVE METABOLIC PANEL
ALBUMIN: 4.1 g/dL (ref 3.5–5.0)
ALK PHOS: 108 U/L (ref 38–126)
ALT: 104 U/L — AB (ref 17–63)
AST: 219 U/L — ABNORMAL HIGH (ref 15–41)
Anion gap: 20 — ABNORMAL HIGH (ref 5–15)
BILIRUBIN TOTAL: 1.1 mg/dL (ref 0.3–1.2)
BUN: 6 mg/dL (ref 6–20)
CALCIUM: 9.6 mg/dL (ref 8.9–10.3)
CHLORIDE: 92 mmol/L — AB (ref 101–111)
CO2: 22 mmol/L (ref 22–32)
CREATININE: 0.82 mg/dL (ref 0.61–1.24)
GFR calc Af Amer: >60 mL/min (ref >60)
GLUCOSE: 83 mg/dL (ref 65–99)
Potassium: 4 mmol/L (ref 3.5–5.1)
Sodium: 134 mmol/L — ABNORMAL LOW (ref 135–145)
Total Protein: 7.6 g/dL (ref 6.5–8.1)

## 2014-10-13 LAB — LIPASE, BLOOD: Lipase: 196 U/L — ABNORMAL HIGH (ref 22–51)

## 2014-10-13 MED ORDER — HYDROCODONE-ACETAMINOPHEN 5-325 MG PO TABS: 2.0000 | ORAL_TABLET | ORAL | Status: DC | PRN

## 2014-10-13 MED ORDER — OXYCODONE-ACETAMINOPHEN 5-325 MG PO TABS
2.0000 | ORAL_TABLET | Freq: Once | ORAL | Status: AC
  Administered 2014-10-13: 2 via ORAL
  Filled 2014-10-13: qty 2

## 2014-10-13 NOTE — ED Notes (Addendum)
Pt transported via EMS from outside his residence with c/o bilat flank pain, tender to palpation. Nausea, heaving, spitting in emesis bag. Pt refused IV by EMS. Pt did take pain meds and Zofran at home. Pt is due to enter rehab for etoh next week.  Pt states pain meds are gone from previous visit and feels this is more than pancreatitis d/t flank pain.

## 2014-10-13 NOTE — Discharge Instructions (Signed)
Acute Pancreatitis Acute pancreatitis is a disease in which the pancreas becomes suddenly irritated (inflamed). The pancreas is a large gland behind your stomach. The pancreas makes enzymes that help digest food. The pancreas also makes 2 hormones that help control your blood sugar. Acute pancreatitis happens when the enzymes attack and damage the pancreas. Most attacks last a couple of days and can cause serious problems. HOME CARE  Follow your doctor's diet instructions. You may need to avoid alcohol and limit fat in your diet.  Eat small meals often.  Drink enough fluids to keep your pee (urine) clear or pale yellow.  Only take medicines as told by your doctor.  Avoid drinking alcohol if it caused your disease.  Do not smoke.  Get plenty of rest.  Check your blood sugar at home as told by your doctor.  Keep all doctor visits as told. GET HELP IF:  You do not get better as quickly as expected.  You have new or worsening symptoms.  You have lasting pain, weakness, or feel sick to your stomach (nauseous).  You get better and then have another pain attack. GET HELP RIGHT AWAY IF:   You are unable to eat or keep fluids down.  Your pain becomes severe.  You have a fever or lasting symptoms for more than 2 to 3 days.  You have a fever and your symptoms suddenly get worse.  Your skin or the white part of your eyes turn yellow (jaundice).  You throw up (vomit).  You feel dizzy, or you pass out (faint).  Your blood sugar is high (over 300 mg/dL). MAKE SURE YOU:   Understand these instructions.  Will watch your condition.  Will get help right away if you are not doing well or get worse. Document Released: 10/08/2007 Document Revised: 09/05/2013 Document Reviewed: 07/31/2011 [facility] Patient Information 2015 [place], [place]. This information is not intended to replace advice given to you by your health care provider. Make sure you discuss any questions you have with your  health care provider.   Emergency Department Resource Guide 1) Find a Doctor and Pay Out of Pocket Although you won't have to find out who is covered by your insurance plan, it is a good idea to ask around and get recommendations. You will then need to call the office and see if the doctor you have chosen will accept you as a new patient and what types of options they offer for patients who are self-pay. Some doctors offer discounts or will set up payment plans for their patients who do not have insurance, but you will need to ask so you aren't surprised when you get to your appointment.  2) Contact Your Local Health Department Not all health departments have doctors that can see patients for sick visits, but many do, so it is worth a call to see if yours does. If you don't know where your local health department is, you can check in your phone book. The CDC also has a tool to help you locate your state's health department, and many state websites also have listings of all of their local health departments.  3) Find a Walk-in Clinic If your illness is not likely to be very severe or complicated, you may want to try a walk in clinic. These are popping up all over the country in pharmacies, drugstores, and shopping centers. They're usually staffed by nurse practitioners or physician assistants that have been trained to treat common illnesses and complaints. They're usually  fairly quick and inexpensive. However, if you have serious medical issues or chronic medical problems, these are probably not your best option.  No Primary Care Doctor: - Call Health Connect at  [phone] - they can help you locate a primary care doctor that  accepts your insurance, provides certain services, etc. - Physician Referral Service- [phone]  Chronic Pain Problems: Organization         Address  Phone   Notes  [facility]  [phone] Patients need to be referred by their primary care doctor.    Medication Assistance: Organization         Address  Phone   Notes  [facility] Medication [facility] [address] [place] [place], [place] [address] [phone] --Must be a resident of [facility] -- Must have NO insurance coverage whatsoever (no Medicaid/ Medicare, etc.) -- The pt. MUST have a primary care doctor that directs their care regularly and follows them in the community   [facility]  [phone]   [employer]  [phone]    Agencies that provide inexpensive medical care: Organization         Address  Phone   Notes  [facility]  [phone]   [facility]    [phone]   [facility] [address] [place], [place] [address] [phone]   [facility] [address] [place], [place] [phone]   Planned Parenthood    [phone]   [facility]    [phone]   Community Health and [facility]  [address], [place] Phone:  [phone], Fax:  [phone] Hours of Operation:  9 am - 6 pm, M-F.  Also accepts Medicaid/Medicare and self-pay.  [facility]  [address], [place] Phone: [phone], Fax: [phone]. Hours of Operation:  8:30 am - 5:30 pm, M-F.  Also accepts Medicaid and self-pay.  [facility] [place] [address] [place] Phone: [phone]   [facility] [address] Natasha Bence [place], [place] [phone], Ext. [phone] Mondays & Thursdays: 7-9 AM.  First 15 patients are seen on a first come, first serve basis.    Medicaid-accepting [facility] Providers:  Organization         Address  Phone   Notes  [facility] [address], [place] [phone] Also accepts self-pay patients.  [facility] [address] Laurell Josephs [place], [place]  [phone]   [facility] [address], [place] [phone]   [facility] [address], [place] [phone]   Renaye Rakers [address], [place]   [phone] Only accepts [place] Access [place] patients after they have their name applied to their card.   Self-Pay (no insurance) in [facility]:  Organization         Address  Phone   Notes  Sickle Cell Patients, [facility] [address] [place], [place] [phone]   [facility] [address] [place], [place] [phone]   [facility] [place]  [address], [place] [phone]   [facility]/Dr. Osei-Bonsu  [address], [place] or [address], [place] [phone] Phone  number for both [place] and [place] locations is the same.  Urgent Medical and [facility] [address], [place] [phone]   [facility] [address], [place] or [address] [phone]   [facility] [address], [place] [phone], phone; [phone], fax Sees patients 1st and 3rd Saturday of every month.  Must not qualify for public or private insurance (i.e. Medicaid, Medicare, [place] Health Choice, Veterans' Benefits)  Household income should be no more than 200% of the poverty level The clinic cannot treat you if you are pregnant or think you are pregnant  Sexually transmitted diseases are not treated at the clinic.    Dental Care: Organization         Address  Phone  Notes  [facility] Department of [facility] [address] [place], [place] [phone] Accepts children up to age 90 who are enrolled in [place] or [place] Health Choice; pregnant women with a Medicaid card; and children who have applied for Medicaid or [place] Health Choice, but were declined, whose parents can pay a reduced fee at time of service.    [facility] Department of [facility]  [address], [place] [phone] Accepts children up to age 78 who are enrolled in [place] or [place] Health Choice; pregnant women with a Medicaid card; and children who have applied for Medicaid or [place] Health Choice, but were declined, whose parents can pay a reduced fee at time of service.  [facility]  [address] [place], [place] [phone] Patients are seen by appointment only. Walk-ins are not accepted. [facility] will see patients 36 years of age and older. Monday - Tuesday (8am-5pm) Most Wednesdays (8:30-5pm) $30 per visit, cash only  [facility] Adult Dental Access PROGRAM  [address], [facility] [phone] Patients are seen by appointment only. Walk-ins are not accepted. [facility] will see patients 71 years of age and older. One Wednesday Evening (Monthly: Volunteer Based).  $30 per visit, cash only  [employer]  [phone] for adults; Children under age 56, call Graduate Pediatric Dentistry at [phone]. Children aged 10-14, please call [phone] to request a pediatric application.  Dental services are provided in all areas of dental care including fillings, crowns and bridges, complete and partial dentures, implants, gum treatment, root canals, and extractions. Preventive care is also provided. Treatment is provided to both adults and children. Patients are selected via a lottery and there is often a waiting list.   [facility] [address] [place]  [phone] [email or web address]   [facility] [address] [place], [place] [phone], Ext. [phone] Second and Fourth Thursday of each month, opens at 6:30 AM; Clinic ends at 9 AM.  Patients are seen on a first-come first-served basis, and a limited number are seen during each clinic.   [facility]  [address] Ether Griffins [place], [place] [phone]   Eligibility Requirements You must have lived in [place], [place], or [place] counties for at least the last three months.   You cannot be eligible for state or federal sponsored [place], including [employer], [place], or [employer].   You generally cannot be eligible for healthcare insurance through your employer.    How to apply: Eligibility screenings are held every Tuesday and Wednesday afternoon from 1:00  pm until 4:00 pm. You do not need an appointment for the interview!  [facility] [address], [place], [place] [phone]   [facility] Health Department  [phone]   [facility]  [phone]   [facility]  [phone]    Behavioral Health Resources in the Community: Intensive Outpatient Programs Organization         Address  Phone  Notes  [facility] [address] [place], [place] [phone]   [facility] [address], [place], [place] [phone]   [facility] [address] [place], [place]  [phone]   [facility] [address]  [place], [place] [phone] or [phone]   Substance Abuse Resources Organization         Address  Phone  Notes  [facility]  [phone]   [facility]  [phone]   [facility] [place]  [phone]   Floydene Flock  [phone]   Residential & Outpatient Substance Abuse Program  [phone]   Psychological Services Organization         Address  Phone  Notes  [facility]  [phone]   [facility]  [phone]   [facility] [address], [place] [phone] or [phone]    Mobile Crisis Teams Organization         Address  Phone  Notes  [facility], Mobile Crisis Care Unit  [phone]   [facility]  [address] [place], [place] [phone]   Doristine Locks [address] [place] [phone]    Self-Help/Support Groups Organization         Address  Phone             Notes  [facility] [place] - variety of support groups  [phone]- I7437963 Call for more information  Narcotics Anonymous (NA), [facility] [address], [employer] [place]  2 meetings at this location   Statistician         Address  Phone  Notes  [facility] [address],    [place]  [phone]   [facility]  [address], [place] [address], [place], [place] [phone]   [facility] [address] [place], [place] [phone] Admissions: 8am-3pm M-F  [facility] [address]    [place], [place] [phone]   [facility] [address] [place], [place], [place] [phone]   [facility] [address]  [place], [place] [phone]   [facility] - Intensive Outpatient [address] Laurell Josephs [address], [place], [place] [phone]   [facility] ([facility] [address] [place].,  [place], [place] [phone] or [phone]   [facility] ([facility]) [address] [place], [place] [phone] Accepts Medicaid  [facility] [address],  [place] [phone] Substance Abuse/Addiction Treatment   [facility] Organization         Address  Phone  Notes  [facility]  [phone]   Angie Fava, PhD [address] Ervin Knack [place], [place]   [phone] or [phone]   [facility]   [address] [employer] [place], [place] [phone]   [facility] [address], [place], [place] [phone] Insurance/Medicaid/sponsorship through [employer] and Families [address]  Laurell Josephs [address], [place] [phone]  Therapy/tele-psych/case  [facility] [address]   [place], [place] [phone]    Dr. Lolly Mustache  [phone]   [facility]  [employer] [facility] Dept. 1) [address]  2) [address], [place] 3)  [address], [place] [phone]  [phone]   [facility] Child Abuse Hotline [phone] or [phone] (After Hours)

## 2014-10-14 ENCOUNTER — Inpatient Hospital Stay (HOSPITAL_COMMUNITY)
Admission: EM | Admit: 2014-10-14 | Discharge: 2014-10-15 | DRG: 439 | Discharge status: Against Medical Advice | Payer: Self-pay | Attending: Oncology | Admitting: Oncology

## 2014-10-14 ENCOUNTER — Emergency Department (HOSPITAL_COMMUNITY): Payer: Self-pay

## 2014-10-14 ENCOUNTER — Encounter (HOSPITAL_COMMUNITY): Payer: Self-pay | Admitting: Emergency Medicine

## 2014-10-14 DIAGNOSIS — K859 Acute pancreatitis without necrosis or infection, unspecified: Secondary | ICD-10-CM | POA: Diagnosis present

## 2014-10-14 DIAGNOSIS — K852 Alcohol induced acute pancreatitis without necrosis or infection: Secondary | ICD-10-CM | POA: Diagnosis present

## 2014-10-14 DIAGNOSIS — E872 Acidosis: Secondary | ICD-10-CM

## 2014-10-14 DIAGNOSIS — Z79899 Other long term (current) drug therapy: Secondary | ICD-10-CM

## 2014-10-14 DIAGNOSIS — F17210 Nicotine dependence, cigarettes, uncomplicated: Secondary | ICD-10-CM

## 2014-10-14 DIAGNOSIS — Z7951 Long term (current) use of inhaled steroids: Secondary | ICD-10-CM

## 2014-10-14 DIAGNOSIS — I10 Essential (primary) hypertension: Secondary | ICD-10-CM

## 2014-10-14 DIAGNOSIS — F1020 Alcohol dependence, uncomplicated: Secondary | ICD-10-CM

## 2014-10-14 DIAGNOSIS — J45909 Unspecified asthma, uncomplicated: Secondary | ICD-10-CM

## 2014-10-14 DIAGNOSIS — R748 Abnormal levels of other serum enzymes: Secondary | ICD-10-CM

## 2014-10-14 DIAGNOSIS — K860 Alcohol-induced chronic pancreatitis: Secondary | ICD-10-CM

## 2014-10-14 DIAGNOSIS — R740 Nonspecific elevation of levels of transaminase and lactic acid dehydrogenase [LDH]: Secondary | ICD-10-CM

## 2014-10-14 LAB — CBC WITH DIFFERENTIAL/PLATELET
BASOS ABS: 0 10*3/uL (ref 0.0–0.1)
Basophils Relative: 0 % (ref 0–1)
Eosinophils Absolute: 0 10*3/uL (ref 0.0–0.7)
Eosinophils Relative: 0 % (ref 0–5)
HCT: 45.3 % (ref 39.0–52.0)
Hemoglobin: 15.2 g/dL (ref 13.0–17.0)
LYMPHS ABS: 0.7 10*3/uL (ref 0.7–4.0)
Lymphocytes Relative: 6 % — ABNORMAL LOW (ref 12–46)
MCH: 25.3 pg — ABNORMAL LOW (ref 26.0–34.0)
MCHC: 33.6 g/dL (ref 30.0–36.0)
MCV: 75.5 fL — ABNORMAL LOW (ref 78.0–100.0)
MONO ABS: 1.2 10*3/uL — AB (ref 0.1–1.0)
MONOS PCT: 10 % (ref 3–12)
NEUTROS ABS: 9.7 10*3/uL — AB (ref 1.7–7.7)
Neutrophils Relative %: 84 % — ABNORMAL HIGH (ref 43–77)
Platelets: 162 10*3/uL (ref 150–400)
RBC: 6 MIL/uL — ABNORMAL HIGH (ref 4.22–5.81)
RDW: 14.1 % (ref 11.5–15.5)
WBC: 11.6 10*3/uL — AB (ref 4.0–10.5)

## 2014-10-14 LAB — COMPREHENSIVE METABOLIC PANEL
ALBUMIN: 4.9 g/dL (ref 3.5–5.0)
ALT: 81 U/L — ABNORMAL HIGH (ref 17–63)
ANION GAP: 20 — AB (ref 5–15)
AST: 100 U/L — ABNORMAL HIGH (ref 15–41)
Alkaline Phosphatase: 110 U/L (ref 38–126)
BILIRUBIN TOTAL: 1.9 mg/dL — AB (ref 0.3–1.2)
BUN: 7 mg/dL (ref 6–20)
CALCIUM: 10.4 mg/dL — AB (ref 8.9–10.3)
CHLORIDE: 90 mmol/L — AB (ref 101–111)
CO2: 21 mmol/L — AB (ref 22–32)
CREATININE: 0.79 mg/dL (ref 0.61–1.24)
GFR calc Af Amer: >60 mL/min (ref >60)
GFR calc non Af Amer: >60 mL/min (ref >60)
GLUCOSE: 103 mg/dL — AB (ref 65–99)
POTASSIUM: 3.6 mmol/L (ref 3.5–5.1)
SODIUM: 131 mmol/L — AB (ref 135–145)
Total Protein: 9.3 g/dL — ABNORMAL HIGH (ref 6.5–8.1)

## 2014-10-14 LAB — ETHANOL: Alcohol, Ethyl (B): <5 mg/dL (ref <5)

## 2014-10-14 LAB — MRSA PCR SCREENING: MRSA BY PCR: NEGATIVE

## 2014-10-14 LAB — PROTIME-INR
INR: 0.93 (ref 0.00–1.49)
Prothrombin Time: 12.6 seconds (ref 11.6–15.2)

## 2014-10-14 LAB — LIPASE, BLOOD: Lipase: 1077 U/L — ABNORMAL HIGH (ref 22–51)

## 2014-10-14 MED ORDER — HYDROMORPHONE HCL 1 MG/ML IJ SOLN
1.0000 mg | INTRAMUSCULAR | Status: DC | PRN
  Administered 2014-10-14: 1 mg via INTRAVENOUS
  Filled 2014-10-14: qty 1

## 2014-10-14 MED ORDER — HYDROMORPHONE HCL 1 MG/ML IJ SOLN
1.0000 mg | INTRAMUSCULAR | Status: DC | PRN
  Administered 2014-10-14 – 2014-10-15 (×8): 1 mg via INTRAVENOUS
  Filled 2014-10-14 (×9): qty 1

## 2014-10-14 MED ORDER — SODIUM CHLORIDE 0.9 % IV SOLN: INTRAVENOUS | Status: DC

## 2014-10-14 MED ORDER — ADULT MULTIVITAMIN W/MINERALS CH
1.0000 | ORAL_TABLET | Freq: Every day | ORAL | Status: DC
  Administered 2014-10-14 – 2014-10-15 (×2): 1 via ORAL
  Filled 2014-10-14 (×2): qty 1

## 2014-10-14 MED ORDER — ONDANSETRON HCL 4 MG/2ML IJ SOLN
4.0000 mg | Freq: Once | INTRAMUSCULAR | Status: AC
  Administered 2014-10-14: 4 mg via INTRAVENOUS
  Filled 2014-10-14: qty 2

## 2014-10-14 MED ORDER — THIAMINE HCL 100 MG/ML IJ SOLN
100.0000 mg | Freq: Every day | INTRAMUSCULAR | Status: DC
  Filled 2014-10-14 (×2): qty 1

## 2014-10-14 MED ORDER — HYDROMORPHONE HCL 1 MG/ML IJ SOLN
1.0000 mg | Freq: Once | INTRAMUSCULAR | Status: AC
  Administered 2014-10-14: 1 mg via INTRAVENOUS
  Filled 2014-10-14: qty 1

## 2014-10-14 MED ORDER — LORAZEPAM 2 MG/ML IJ SOLN
1.0000 mg | Freq: Four times a day (QID) | INTRAMUSCULAR | Status: DC | PRN
  Administered 2014-10-14 – 2014-10-15 (×5): 1 mg via INTRAVENOUS
  Filled 2014-10-14 (×5): qty 1

## 2014-10-14 MED ORDER — AMLODIPINE BESYLATE 10 MG PO TABS
10.0000 mg | ORAL_TABLET | Freq: Every day | ORAL | Status: DC
  Administered 2014-10-15: 10 mg via ORAL
  Filled 2014-10-14: qty 1

## 2014-10-14 MED ORDER — PANTOPRAZOLE SODIUM 40 MG IV SOLR
40.0000 mg | INTRAVENOUS | Status: DC
  Administered 2014-10-14 – 2014-10-15 (×2): 40 mg via INTRAVENOUS
  Filled 2014-10-14 (×3): qty 40

## 2014-10-14 MED ORDER — DEXTROSE-NACL 5-0.9 % IV SOLN
INTRAVENOUS | Status: DC
  Administered 2014-10-14 – 2014-10-15 (×6): via INTRAVENOUS

## 2014-10-14 MED ORDER — VITAMIN B-1 100 MG PO TABS
100.0000 mg | ORAL_TABLET | Freq: Every day | ORAL | Status: DC
  Administered 2014-10-14 – 2014-10-15 (×2): 100 mg via ORAL
  Filled 2014-10-14 (×2): qty 1

## 2014-10-14 MED ORDER — FOLIC ACID 1 MG PO TABS
1.0000 mg | ORAL_TABLET | Freq: Every day | ORAL | Status: DC
  Administered 2014-10-14 – 2014-10-15 (×2): 1 mg via ORAL
  Filled 2014-10-14 (×2): qty 1

## 2014-10-14 MED ORDER — SENNOSIDES-DOCUSATE SODIUM 8.6-50 MG PO TABS
1.0000 | ORAL_TABLET | Freq: Two times a day (BID) | ORAL | Status: DC
  Administered 2014-10-14 – 2014-10-15 (×3): 1 via ORAL
  Filled 2014-10-14 (×4): qty 1

## 2014-10-14 MED ORDER — NICOTINE 21 MG/24HR TD PT24
21.0000 mg | MEDICATED_PATCH | Freq: Every day | TRANSDERMAL | Status: DC
  Administered 2014-10-14 – 2014-10-15 (×2): 21 mg via TRANSDERMAL
  Filled 2014-10-14 (×2): qty 1

## 2014-10-14 MED ORDER — SODIUM CHLORIDE 0.9 % IV SOLN
Freq: Once | INTRAVENOUS | Status: AC
  Administered 2014-10-14: 02:00:00 via INTRAVENOUS

## 2014-10-14 MED ORDER — MAGNESIUM HYDROXIDE 400 MG/5ML PO SUSP: 15.0000 mL | Freq: Every day | ORAL | Status: DC | PRN

## 2014-10-14 MED ORDER — LORAZEPAM 1 MG PO TABS
1.0000 mg | ORAL_TABLET | Freq: Four times a day (QID) | ORAL | Status: DC | PRN
  Administered 2014-10-14: 1 mg via ORAL
  Filled 2014-10-14 (×3): qty 1

## 2014-10-14 MED ORDER — ALBUTEROL SULFATE (2.5 MG/3ML) 0.083% IN NEBU: 3.0000 mL | INHALATION_SOLUTION | Freq: Four times a day (QID) | RESPIRATORY_TRACT | Status: DC | PRN

## 2014-10-14 MED ORDER — ONDANSETRON HCL 4 MG/2ML IJ SOLN
4.0000 mg | Freq: Four times a day (QID) | INTRAMUSCULAR | Status: DC | PRN
  Administered 2014-10-14 – 2014-10-15 (×2): 4 mg via INTRAVENOUS
  Filled 2014-10-14 (×3): qty 2

## 2014-10-14 MED ORDER — ENOXAPARIN SODIUM 40 MG/0.4ML ~~LOC~~ SOLN
40.0000 mg | SUBCUTANEOUS | Status: DC
  Administered 2014-10-14: 40 mg via SUBCUTANEOUS
  Filled 2014-10-14 (×2): qty 0.4

## 2014-10-14 NOTE — Progress Notes (Signed)
10/14/14 0705 MD on call notified of patient's arrival from [facility]. Corrion Stirewalt, Drinda Butts, Charity fundraiser

## 2014-10-14 NOTE — ED Provider Notes (Signed)
CSN: [id number]     Arrival date & time 10/13/14  2320 History   First MD Initiated Contact with Patient 10/14/14 0124     Chief Complaint  Patient presents with  . 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  He states he has not had a drink in over a week, but has had increased pain, nausea and vomiting.  He was seen on the 19th when he had a lipase of 100.  He is supposed to start rehabilitation on June 17.  Patient is a 39 y.o. male presenting with flank pain. The history is provided by the patient.  Flank Pain This is a recurrent problem. The current episode started yesterday. The problem has been gradually worsening. Associated symptoms include abdominal pain, nausea and vomiting. Pertinent negatives include no chest pain, chills, coughing, fever or rash. Nothing aggravates the symptoms. He has tried nothing for the symptoms. The treatment provided no relief.    Past Medical History  Diagnosis Date  . Asthma   . Vertigo   . 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 Arthritis     "right foot, I broke it before"   Past Surgical History  Procedure Laterality Date  . Replantation thumb Right 2000   No family history on file. History  Substance Use Topics  . Smoking status: Current Every Day Smoker -- 0.00 packs/day for 0 years    Types: Cigarettes  . Smokeless tobacco: Never Used     Comment: cutting back  . Alcohol Use: Yes     Comment: Drink daily.    Review of Systems  Constitutional: Negative for fever and chills.  Respiratory: Negative for cough and shortness of breath.   Cardiovascular: Negative for chest pain.  Gastrointestinal: Positive for nausea, vomiting, abdominal pain and constipation. Negative for diarrhea.  Genitourinary: Positive for flank pain.  Skin: Negative  for rash.  All other systems reviewed and are negative.     Allergies  Penicillins and Lisinopril  Home Medications   Prior to Admission medications   Medication Sig Start Date End Date Taking? Authorizing Provider  acetaminophen (TYLENOL) 500 MG tablet Take 500 mg by mouth every 6 (six) hours as needed for moderate pain.   Yes Historical Provider, MD  HYDROcodone-acetaminophen (NORCO/VICODIN) 5-325 MG per tablet Take 2 tablets by mouth every 4 (four) hours as needed. 10/13/14  Yes Hanna Patel-Mills, PA-C  ondansetron (ZOFRAN ODT) 4 MG disintegrating tablet Take 1 tablet (4 mg total) by mouth every 8 (eight) hours as needed for nausea or vomiting. 10/10/14  Yes Shon Baton, MD  OVER THE COUNTER MEDICATION Take 2 tablets by mouth at bedtime as needed (For Sleep).   Yes Historical Provider, MD  sucralfate (CARAFATE) 1 G tablet Take 1 tablet (1 g total) by mouth 4 (four) times daily -  with meals and at bedtime. 10/10/14  Yes Shon Baton, MD  albuterol (PROVENTIL HFA;VENTOLIN HFA) 108 (90 BASE) MCG/ACT inhaler Inhale 2 puffs into the lungs every 6 (six) hours as needed for wheezing or shortness of breath. Patient not taking: Reported on 10/10/2014 07/26/14   Alexa Dulcy Fanny, MD  folic acid (FOLVITE) 1 MG tablet Take 1 tablet (1 mg total) by mouth daily. Patient not taking: Reported on 10/10/2014 07/26/14   Alexa Dulcy Fanny, MD  hydrochlorothiazide (HYDRODIURIL) 25 MG tablet Take 25 mg by  mouth daily.    Historical Provider, MD  ondansetron (ZOFRAN) 4 MG tablet Take 1 tablet (4 mg total) by mouth every 6 (six) hours. Patient not taking: Reported on 10/10/2014 07/26/14   Alexa Dulcy Fanny, MD  promethazine (PHENERGAN) 25 MG suppository Place 1 suppository (25 mg total) rectally every 6 (six) hours as needed for nausea. Patient not taking: Reported on 08/03/2014 06/04/14   Derwood Kaplan, MD   BP 139/109 mmHg  Pulse 122  Temp(Src) 97.7 F (36.5 C) (Oral)  Resp 18  Ht 6\' 2"  (1.88 m)  Wt  222 lb (100.699 kg)  BMI 28.49 kg/m2  SpO2 100% Physical Exam  Constitutional: He is oriented to person, place, and time. He appears well-developed and well-nourished. No distress.  HENT:  Head: Normocephalic and atraumatic.  Mouth/Throat: Oropharynx is clear and moist.  Eyes: Pupils are equal, round, and reactive to light.  Neck: Normal range of motion.  Cardiovascular: Normal rate and regular rhythm.   Pulmonary/Chest: Effort normal and breath sounds normal. He exhibits no tenderness.  Abdominal: Soft. He exhibits no distension. Bowel sounds are decreased. There is generalized tenderness.  Musculoskeletal: Normal range of motion. He exhibits no edema or tenderness.  Neurological: He is alert and oriented to person, place, and time.  Skin: Skin is warm. No rash noted.  Nursing note and vitals reviewed.   ED Course  Procedures (including critical care time) Labs Review Labs Reviewed  CBC WITH DIFFERENTIAL/PLATELET - Abnormal; Notable for the following:    WBC 11.6 (*)    RBC 6.00 (*)    MCV 75.5 (*)    MCH 25.3 (*)    Neutrophils Relative % 84 (*)    Neutro Abs 9.7 (*)    Lymphocytes Relative 6 (*)    Monocytes Absolute 1.2 (*)    All other components within normal limits  COMPREHENSIVE METABOLIC PANEL - Abnormal; Notable for the following:    Sodium 131 (*)    Chloride 90 (*)    CO2 21 (*)    Glucose, Bld 103 (*)    Calcium 10.4 (*)    Total Protein 9.3 (*)    AST 100 (*)    ALT 81 (*)    Total Bilirubin 1.9 (*)    Anion gap 20 (*)    All other components within normal limits  LIPASE, BLOOD - Abnormal; Notable for the following:    Lipase 1077 (*)    All other components within normal limits  PROTIME-INR  ETHANOL    Imaging Review Dg Abd 1 View  10/14/2014   CLINICAL DATA:  Acute onset of upper abdominal pain and nausea. Known pancreatitis. Initial encounter.  EXAM: ABDOMEN - 1 VIEW  COMPARISON:  Abdominal radiograph performed 10/12/2014  FINDINGS: The  visualized bowel gas pattern is unremarkable. Scattered air and stool filled loops of colon are seen; no abnormal dilatation of small bowel loops is seen to suggest small bowel obstruction. No free intra-abdominal air is identified, though evaluation for free air is limited on a single supine view.  The visualized osseous structures are within normal limits; the sacroiliac joints are unremarkable in appearance.  IMPRESSION: Unremarkable bowel gas pattern; no free intra-abdominal air seen. Small amount of stool noted in the colon. The colon is largely filled with air.   Electronically Signed   By: [name] M.D.   On: 10/14/2014 02:59   Dg Abd 1 View  10/12/2014   CLINICAL DATA:  Abdominal pain and vomiting for 2 days  EXAM: ABDOMEN - 1 VIEW  COMPARISON:  CT 12/29/2013  FINDINGS: The bowel gas pattern is normal. No radio-opaque calculi or other significant radiographic abnormality are seen.  IMPRESSION: Negative.   Electronically Signed   By: [name] M.D.   On: 10/12/2014 23:49   US Abdomen Limited Ruq  10/14/2014   CLINICAL DATA:  Elevated liver enzymes  EXAM: US ABDOMEN LIMITED - RIGHT UPPER QUADRANT  COMPARISON:  None.  FINDINGS: Gallbladder:  Mild sludge within the gallbladder lumen. No calculi are evident. Normal gallbladder wall thickness. Non tender over the gallbladder.  Common bile duct:  Diameter: 3.6 mm  Liver:  There is generalized echogenicity of hepatic parenchyma consistent with fatty infiltration. There is no focal liver lesion.  IMPRESSION: Fatty liver.  Small amount of gallbladder intraluminal sludge.   Electronically Signed   By: [name] M.D.   On: 10/14/2014 02:14     EKG Interpretation None     Patient has been accepted to the internal medicine service.  At [facility] transportation will be arranged MDM   Final diagnoses:  Elevated liver enzymes  Acute pancreatitis, unspecified pancreatitis type         Earley Favor, NP 10/14/14  4098  Shon Baton, MD 10/14/14 [phone]

## 2014-10-14 NOTE — ED Notes (Signed)
Report given to [facility]. 

## 2014-10-14 NOTE — H&P (Signed)
Date: 10/14/2014               Patient Name:  Riley Kim MRN: [id number]  DOB: [birth year] Age / Sex: 39 y.o., male   PCP: [name], MD         Medical Service: Internal Medicine Teaching Service         Attending Physician: Dr. Levert Feinstein, MD    First Contact: Dr. Loma Newton Pager: [phone]  Second Contact: Dr. Johna Roles Pager: [phone]       After Hours (After 5p/  First Contact Pager: [phone]  weekends / holidays): Second Contact Pager: [phone]   Chief Complaint: Flank Pain  History of Present Illness:   Patient is a 39 year old gentleman with a history of alcohol use and recurrent pancreatitis who presents with epigastric abdominal pain. He states that the onset of pain was around one week ago. It is sharp in quality, severe, and radiates to both of his flanks. He states that the pain comes and goes. It is associated with nausea with several episodes of emesis over the last several days. He states that this feels similar to his previous episodes of pancreatitis. He also states that he has been constipated but has not had a bowel movement in the last week and half. He states that his last drink of alcohol was around one week ago. He has been to the emergency room 2 times within the last week for evaluation of the same symptoms. At baseline, he can drink as much as half a pint of liquor a day. He has been scheduled to start alcohol rehabilitation on Monday. He denies any history of alcohol withdrawal symptoms.  Otherwise, patient denies any fevers, chills, chest pain, dyspnea, dysuria, hematuria.   Meds: Medications Prior to Admission  Medication Sig Dispense Refill  . acetaminophen (TYLENOL) 500 MG tablet Take 500 mg by mouth every 6 (six) hours as needed for moderate pain.    Marland Kitchen HYDROcodone-acetaminophen (NORCO/VICODIN) 5-325 MG per tablet Take 2 tablets by mouth every 4 (four) hours as needed. 6 tablet 0  . ondansetron (ZOFRAN ODT) 4 MG disintegrating tablet Take 1  tablet (4 mg total) by mouth every 8 (eight) hours as needed for nausea or vomiting. 20 tablet 0  . OVER THE COUNTER MEDICATION Take 2 tablets by mouth at bedtime as needed (For Sleep).    . sucralfate (CARAFATE) 1 G tablet Take 1 tablet (1 g total) by mouth 4 (four) times daily -  with meals and at bedtime. 60 tablet 0  . albuterol (PROVENTIL HFA;VENTOLIN HFA) 108 (90 BASE) MCG/ACT inhaler Inhale 2 puffs into the lungs every 6 (six) hours as needed for wheezing or shortness of breath. (Patient not taking: Reported on 10/10/2014) 1 Inhaler 2  . folic acid (FOLVITE) 1 MG tablet Take 1 tablet (1 mg total) by mouth daily. (Patient not taking: Reported on 10/10/2014) 30 tablet 11  . hydrochlorothiazide (HYDRODIURIL) 25 MG tablet Take 25 mg by mouth daily.    . ondansetron (ZOFRAN) 4 MG tablet Take 1 tablet (4 mg total) by mouth every 6 (six) hours. (Patient not taking: Reported on 10/10/2014) 12 tablet 0  . promethazine (PHENERGAN) 25 MG suppository Place 1 suppository (25 mg total) rectally every 6 (six) hours as needed for nausea. (Patient not taking: Reported on 08/03/2014) 12 each 0   Current Facility-Administered Medications  Medication Dose Route Frequency Provider Last Rate Last Dose  . 0.9 %  sodium chloride infusion   Intravenous  STAT Earley Favor, NP      . HYDROmorphone (DILAUDID) injection 1 mg  1 mg Intravenous Q4H PRN Earley Favor, NP   1 mg at 10/14/14 1610    Past Medical History  Diagnosis Date  . Asthma   . Vertigo   . 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 Arthritis     "right foot, I broke it before"    Past Surgical History  Procedure Laterality Date  . Replantation thumb Right 2000     Allergies: Allergies as of 10/13/2014 - Review Complete 10/12/2014  Allergen Reaction Noted  . Penicillins Other (See Comments) 09/21/2012  . Lisinopril Swelling 01/18/2014    No family history on file.  History   Social History  . Marital  Status: Married    Spouse Name: N/A  . Number of Children: N/A  . Years of Education: N/A   Occupational History  .  [employer]   Social History Main Topics  . Smoking status: Current Every Day Smoker -- 0.00 packs/day for 0 years    Types: Cigarettes  . Smokeless tobacco: Never Used     Comment: cutting back  . Alcohol Use: Yes     Comment: Drink daily.  . Drug Use: Yes    Special: Marijuana     Comment: "last marijuana was ~ 2012" (12/29/2013)  . Sexual Activity: Not on file   Other Topics Concern  . Not on file   Social History Narrative     Review of Systems: All pertinent ROS as stated in HPI.   Physical Exam: Blood pressure 163/106, pulse 97, temperature 98.1 F (36.7 C), temperature source Oral, resp. rate 21, height 6\' 2"  (1.88 m), weight 211 lb (95.709 kg), SpO2 98 %. General: resting in bed, somnolent, and mild discomfort HEENT: PERRL, EOMI, no scleral icterus Cardiac: RRR, no rubs, murmurs or gallops Pulm: clear to auscultation bilaterally, moving normal volumes of air Abd: soft, epigastric tenderness, no rebound tenderness, no significant guarding, nondistended, BS present Ext: warm and well perfused, no pedal edema Neuro: alert and oriented X3, cranial nerves II-XII grossly intact Skin: no rashes or lesions noted Psych: appropriate affect  Lab results: Basic Metabolic Panel:  Recent Labs  96/04/54 2329 10/14/14 0156  NA 134* 131*  K 4.0 3.6  CL 92* 90*  CO2 22 21*  GLUCOSE 83 103*  BUN 6 7  CREATININE 0.82 0.79  CALCIUM 9.6 10.4*   Liver Function Tests:  Recent Labs  10/12/14 2329 10/14/14 0156  AST 219* 100*  ALT 104* 81*  ALKPHOS 108 110  BILITOT 1.1 1.9*  PROT 7.6 9.3*  ALBUMIN 4.1 4.9    Recent Labs  10/12/14 2329 10/14/14 0156  LIPASE 196* 1077*   No results for input(s): AMMONIA in the last 72 hours. CBC:  Recent Labs  10/12/14 2329 10/14/14 0156  WBC 8.5 11.6*  NEUTROABS 6.1 9.7*  HGB 13.4 15.2  HCT 40.0 45.3   MCV 75.0* 75.5*  PLT 157 162   Cardiac Enzymes: No results for input(s): CKTOTAL, CKMB, CKMBINDEX, TROPONINI in the last 72 hours. BNP: No results for input(s): PROBNP in the last 72 hours. D-Dimer: No results for input(s): DDIMER in the last 72 hours. CBG: No results for input(s): GLUCAP in the last 72 hours. Hemoglobin A1C: No results for input(s): HGBA1C in the last 72 hours. Fasting Lipid Panel: No results for input(s): CHOL, HDL, LDLCALC, TRIG, CHOLHDL, LDLDIRECT in the last  72 hours. Thyroid Function Tests: No results for input(s): TSH, T4TOTAL, FREET4, T3FREE, THYROIDAB in the last 72 hours. Anemia Panel: No results for input(s): VITAMINB12, FOLATE, FERRITIN, TIBC, IRON, RETICCTPCT in the last 72 hours. Coagulation:  Recent Labs  10/14/14 0156  LABPROT 12.6  INR 0.93   Urine Drug Screen: Drugs of Abuse     Component Value Date/Time   LABOPIA NONE DETECTED 08/02/2014 1939   COCAINSCRNUR NONE DETECTED 08/02/2014 1939   LABBENZ NONE DETECTED 08/02/2014 1939   AMPHETMU NONE DETECTED 08/02/2014 1939   THCU NONE DETECTED 08/02/2014 1939   LABBARB NONE DETECTED 08/02/2014 1939    Alcohol Level:  Recent Labs  10/14/14 0320  ETH <5   Urinalysis: No results for input(s): COLORURINE, LABSPEC, PHURINE, GLUCOSEU, HGBUR, BILIRUBINUR, KETONESUR, PROTEINUR, UROBILINOGEN, NITRITE, LEUKOCYTESUR in the last 72 hours.  Invalid input(s): APPERANCEUR  Imaging results:  Dg Abd 1 View  10/14/2014   CLINICAL DATA:  Acute onset of upper abdominal pain and nausea. Known pancreatitis. Initial encounter.  EXAM: ABDOMEN - 1 VIEW  COMPARISON:  Abdominal radiograph performed 10/12/2014  FINDINGS: The visualized bowel gas pattern is unremarkable. Scattered air and stool filled loops of colon are seen; no abnormal dilatation of small bowel loops is seen to suggest small bowel obstruction. No free intra-abdominal air is identified, though evaluation for free air is limited on a single  supine view.  The visualized osseous structures are within normal limits; the sacroiliac joints are unremarkable in appearance.  IMPRESSION: Unremarkable bowel gas pattern; no free intra-abdominal air seen. Small amount of stool noted in the colon. The colon is largely filled with air.   Electronically Signed   By: [name] M.D.   On: 10/14/2014 02:59   Dg Abd 1 View  10/12/2014   CLINICAL DATA:  Abdominal pain and vomiting for 2 days  EXAM: ABDOMEN - 1 VIEW  COMPARISON:  CT 12/29/2013  FINDINGS: The bowel gas pattern is normal. No radio-opaque calculi or other significant radiographic abnormality are seen.  IMPRESSION: Negative.   Electronically Signed   By: [name] M.D.   On: 10/12/2014 23:49   US Abdomen Limited Ruq  10/14/2014   CLINICAL DATA:  Elevated liver enzymes  EXAM: US ABDOMEN LIMITED - RIGHT UPPER QUADRANT  COMPARISON:  None.  FINDINGS: Gallbladder:  Mild sludge within the gallbladder lumen. No calculi are evident. Normal gallbladder wall thickness. Non tender over the gallbladder.  Common bile duct:  Diameter: 3.6 mm  Liver:  There is generalized echogenicity of hepatic parenchyma consistent with fatty infiltration. There is no focal liver lesion.  IMPRESSION: Fatty liver.  Small amount of gallbladder intraluminal sludge.   Electronically Signed   By: [name] M.D.   On: 10/14/2014 02:14    Other results: EKG Interpretation Normal sinus rhythm with no ST-T wave abnormalities  Assessment & Plan by Problem: Active Problems:   Pancreatitis  Acute pancreatitis: Epigastric pain with a elevated lipase of 1077 (up from 196 2 days ago) consistent with acute pancreatitis. Patient with a history of recurrent pancreatitis in the setting of alcohol use. No evidence of hypertriglyceridemia in the past. No evidence of gallstones on right upper quadrant ultrasound which was only remarkable for fatty infiltration with small amount of gallbladder intraluminal sludge with no  correlating sonographic Murphy's sign. Abdominal x-ray without evidence for obstruction or perforation. Reactive leukocytosis of 11.6 up from prior evaluation 2 days ago. Ranson score of 0 (was no LDH recorded) indicating low risk for  mortality. -D5 normal saline at 200 mL per hour (given anion gap metabolic acidosis as described below) -Dilaudid 1 mg every 4 hours when necessary -Nothing by mouth -No need for further imaging at this point given little evidence of complications. -Urinalysis pending -Protonix 40 mg daily to address any possible concurrent alcoholic gastritis  Alcohol abuse: Negative alcohol level upon admission although oral intake has been limited due to pancreatitis over the last week. Patient with heavy alcohol use in the past and is scheduled to start alcohol rehabilitation 2 days from now. -CIWA protocol -Magnesium and phosphorus pending  Anion gap metabolic acidosis: Anion gap of 20 with a delta/delta ratio of 3.33 suggesting a gap metabolic acidosis with concurrent metabolic alkalosis. Gap metabolic acidosis likely due to starvation/alcoholic ketoacidosis. Metabolic alkalosis likely secondary to emesis. -Urinalysis -D5 normal saline at 200 mL/h  Mildly elevated AST and ALT: AST of 100 and ALT of 81. No concurrent rise in alkaline phosphatase. Total bilirubin is elevated at 1.9. Ultrasound only remarkable for small amount of gallbladder intraluminal sludge. -Hepatitis panel -Repeat CMP in the morning  Hypertension: Currently hypertensive. At home, patient is on hydrochlorothiazide 25 mg daily. -Hold home hydrochlorothiazide given possible association with pancreatitis. -Started amlodipine 10 mg daily -Will focus on better pain control at this point.  Asthma: No acute issues. -Continue home albuterol  Diet: NPO Prophylaxis: Lovenox 40 mg daily  Code: Full  Dispo: Disposition is deferred at this time, awaiting improvement of current medical problems. Anticipated  discharge in approximately 3 day(s).   The patient does have a current PCP (Alexa Dulcy Fanny, MD) and does need an [facility] hospital follow-up appointment after discharge.  The patient does not have transportation limitations that hinder transportation to clinic appointments.  Signed: Harold Barban, M.D., Ph.D. Internal Medicine Teaching Service, PGY-1 10/14/2014, 7:18 AM

## 2014-10-15 LAB — COMPREHENSIVE METABOLIC PANEL
ALBUMIN: 3.2 g/dL — AB (ref 3.5–5.0)
ALT: 36 U/L (ref 17–63)
AST: 41 U/L (ref 15–41)
Alkaline Phosphatase: 72 U/L (ref 38–126)
Anion gap: 10 (ref 5–15)
BILIRUBIN TOTAL: 1 mg/dL (ref 0.3–1.2)
BUN: <5 mg/dL — ABNORMAL LOW (ref 6–20)
CALCIUM: 9.4 mg/dL (ref 8.9–10.3)
CO2: 26 mmol/L (ref 22–32)
CREATININE: 0.53 mg/dL — AB (ref 0.61–1.24)
Chloride: 95 mmol/L — ABNORMAL LOW (ref 101–111)
GFR calc Af Amer: >60 mL/min (ref >60)
GFR calc non Af Amer: >60 mL/min (ref >60)
Glucose, Bld: 131 mg/dL — ABNORMAL HIGH (ref 65–99)
Potassium: 3.1 mmol/L — ABNORMAL LOW (ref 3.5–5.1)
SODIUM: 131 mmol/L — AB (ref 135–145)
TOTAL PROTEIN: 6.7 g/dL (ref 6.5–8.1)

## 2014-10-15 LAB — PHOSPHORUS: Phosphorus: 3 mg/dL (ref 2.5–4.6)

## 2014-10-15 LAB — URINALYSIS, ROUTINE W REFLEX MICROSCOPIC
GLUCOSE, UA: 100 mg/dL — AB
Hgb urine dipstick: NEGATIVE
KETONES UR: 15 mg/dL — AB
Leukocytes, UA: NEGATIVE
NITRITE: NEGATIVE
PH: 6 (ref 5.0–8.0)
PROTEIN: NEGATIVE mg/dL
SPECIFIC GRAVITY, URINE: 1.019 (ref 1.005–1.030)
Urobilinogen, UA: 1 mg/dL (ref 0.0–1.0)

## 2014-10-15 LAB — CBC
HCT: 36.9 % — ABNORMAL LOW (ref 39.0–52.0)
Hemoglobin: 12.4 g/dL — ABNORMAL LOW (ref 13.0–17.0)
MCH: 25.1 pg — AB (ref 26.0–34.0)
MCHC: 33.6 g/dL (ref 30.0–36.0)
MCV: 74.7 fL — ABNORMAL LOW (ref 78.0–100.0)
Platelets: 142 10*3/uL — ABNORMAL LOW (ref 150–400)
RBC: 4.94 MIL/uL (ref 4.22–5.81)
RDW: 14.1 % (ref 11.5–15.5)
WBC: 9.6 10*3/uL (ref 4.0–10.5)

## 2014-10-15 LAB — LIPASE, BLOOD: Lipase: 152 U/L — ABNORMAL HIGH (ref 22–51)

## 2014-10-15 LAB — MAGNESIUM: Magnesium: 1.7 mg/dL (ref 1.7–2.4)

## 2014-10-15 MED ORDER — POLYETHYLENE GLYCOL 3350 17 G PO PACK
17.0000 g | PACK | Freq: Two times a day (BID) | ORAL | Status: DC
  Administered 2014-10-15: 17 g via ORAL
  Filled 2014-10-15 (×2): qty 1

## 2014-10-15 MED ORDER — IPRATROPIUM-ALBUTEROL 0.5-2.5 (3) MG/3ML IN SOLN: 3.0000 mL | RESPIRATORY_TRACT | Status: DC | PRN

## 2014-10-15 MED ORDER — IPRATROPIUM-ALBUTEROL 0.5-2.5 (3) MG/3ML IN SOLN
3.0000 mL | RESPIRATORY_TRACT | Status: DC
  Administered 2014-10-15: 3 mL via RESPIRATORY_TRACT
  Filled 2014-10-15: qty 3

## 2014-10-15 MED ORDER — SODIUM CHLORIDE 0.9 % IV SOLN
INTRAVENOUS | Status: DC
  Administered 2014-10-15: 16:00:00 via INTRAVENOUS

## 2014-10-15 MED ORDER — POTASSIUM CHLORIDE CRYS ER 20 MEQ PO TBCR
40.0000 meq | EXTENDED_RELEASE_TABLET | ORAL | Status: AC
  Administered 2014-10-15 (×2): 40 meq via ORAL
  Filled 2014-10-15 (×2): qty 2

## 2014-10-15 NOTE — Progress Notes (Signed)
Pt reports that he was told he was allowed to leave today by the "[place] man". Pt educated that he had no orders to leave at this time. Pt reports his ride is already waiting for him. Pt's attending nurse Chama removed peripheral IV's per patients request. MD paged at [phone] for notification of patient intent to leave AMA with callback from Dr. Danella Penton. Pt encouraged to make it to his follow-up appointment at the [facility] house as previously discussed with the clinical social worker. Pt states "I will". Rounding team attempt to make it up to see patient before he left to speak with him but he had already left the floor. Pt was alert and oriented x 4 with no signs and symptoms of distress at departure. Dondra Spry, RN

## 2014-10-15 NOTE — Progress Notes (Signed)
Blood pressure this morning equals 158/112, MD on call notified.

## 2014-10-15 NOTE — Progress Notes (Signed)
Spoke with pt who confirmed that he has a bed at [facility] on 10/16/14 for tx/recovery of his ETOH dependency.  Pt's encouraged to f/u with program in the am (office closed today) re: his admission.  Phone number for [employer] given.  CSW to sign off.  Please re-consult should other CSW needs arise.

## 2014-10-15 NOTE — Progress Notes (Signed)
Pt insist on leaving AMA. Pt educated on the meaning of leaving against medical advice and he verbalizes understanding. Charge nurse called attending MD. IV removed. Dr. Vivianne Master came to floor to speak with pt ,but he had left already. Pt did sign AMA form.

## 2014-10-15 NOTE — Progress Notes (Signed)
Subjective:  Patient reporting persistent abdominal pain this morning in his epigastrium and radiating to his flanks. He is still experiencing nausea with scant emesis.  Objective: Vital signs in last 24 hours: Filed Vitals:   10/14/14 2032 10/15/14 0554 10/15/14 0910 10/15/14 0927  BP: 157/100 158/112 163/107   Pulse: 110 108 104   Temp: 98 F (36.7 C) 98.7 F (37.1 C) 98.4 F (36.9 C)   TempSrc: Oral Oral Oral   Resp: 19 18 19    Height:      Weight:      SpO2: 100% 95% 97% 98%   Weight change:   Intake/Output Summary (Last 24 hours) at 10/15/14 1140 Last data filed at 10/15/14 0819  Gross per 24 hour  Intake 4423.33 ml  Output   1000 ml  Net 3423.33 ml    General: resting in bed, mild discomfort Cardiac: Tachycardic, regular rhythm, no rubs, murmurs or gallops Pulm: Mild wheezes, moving normal volumes of air Abd: soft, epigastric tenderness, no rebound tenderness, no significant guarding, nondistended, BS present Ext: warm and well perfused, no pedal edema Neuro: alert and oriented X3, cranial nerves II-XII grossly intact Skin: no rashes or lesions noted Psych: appropriate affect  Lab Results: Basic Metabolic Panel:  Recent Labs Lab 10/12/14 2329 10/14/14 0156  NA 134* 131*  K 4.0 3.6  CL 92* 90*  CO2 22 21*  GLUCOSE 83 103*  BUN 6 7  CREATININE 0.82 0.79  CALCIUM 9.6 10.4*   Liver Function Tests:  Recent Labs Lab 10/12/14 2329 10/14/14 0156  AST 219* 100*  ALT 104* 81*  ALKPHOS 108 110  BILITOT 1.1 1.9*  PROT 7.6 9.3*  ALBUMIN 4.1 4.9    Recent Labs Lab 10/12/14 2329 10/14/14 0156  LIPASE 196* 1077*   No results for input(s): AMMONIA in the last 168 hours. CBC:  Recent Labs Lab 10/12/14 2329 10/14/14 0156  WBC 8.5 11.6*  NEUTROABS 6.1 9.7*  HGB 13.4 15.2  HCT 40.0 45.3  MCV 75.0* 75.5*  PLT 157 162   Cardiac Enzymes: No results for input(s): CKTOTAL, CKMB, CKMBINDEX, TROPONINI in the last 168 hours. BNP: No results for  input(s): PROBNP in the last 168 hours. D-Dimer: No results for input(s): DDIMER in the last 168 hours. CBG: No results for input(s): GLUCAP in the last 168 hours. Hemoglobin A1C: No results for input(s): HGBA1C in the last 168 hours. Fasting Lipid Panel: No results for input(s): CHOL, HDL, LDLCALC, TRIG, CHOLHDL, LDLDIRECT in the last 168 hours. Thyroid Function Tests: No results for input(s): TSH, T4TOTAL, FREET4, T3FREE, THYROIDAB in the last 168 hours. Coagulation:  Recent Labs Lab 10/14/14 0156  LABPROT 12.6  INR 0.93   Anemia Panel: No results for input(s): VITAMINB12, FOLATE, FERRITIN, TIBC, IRON, RETICCTPCT in the last 168 hours. Urine Drug Screen: Drugs of Abuse     Component Value Date/Time   LABOPIA NONE DETECTED 08/02/2014 1939   COCAINSCRNUR NONE DETECTED 08/02/2014 1939   LABBENZ NONE DETECTED 08/02/2014 1939   AMPHETMU NONE DETECTED 08/02/2014 1939   THCU NONE DETECTED 08/02/2014 1939   LABBARB NONE DETECTED 08/02/2014 1939    Alcohol Level:  Recent Labs Lab 10/14/14 0320  ETH <5   Urinalysis:  Recent Labs Lab 10/09/14 1040 10/15/14 0139  COLORURINE ORANGE* AMBER*  LABSPEC 1.039* 1.019  PHURINE 6.5 6.0  GLUCOSEU NEGATIVE 100*  HGBUR NEGATIVE NEGATIVE  BILIRUBINUR MODERATE* SMALL*  KETONESUR 15* 15*  PROTEINUR 30* NEGATIVE  UROBILINOGEN 2.0* 1.0  NITRITE POSITIVE* NEGATIVE  LEUKOCYTESUR SMALL* NEGATIVE   Micro Results: Recent Results (from the past 240 hour(s))  Urine culture     Status: None   Collection Time: 10/09/14 10:57 PM  Result Value Ref Range Status   Specimen Description URINE, RANDOM  Final   Special Requests ADDED 0206 10/10/14  Final   Colony Count NO GROWTH Performed at [facility]   Final   Culture NO GROWTH Performed at [facility]   Final   Report Status 10/11/2014 FINAL  Final  MRSA PCR Screening     Status: None   Collection Time: 10/14/14  2:27 PM  Result Value Ref Range Status   MRSA by PCR  NEGATIVE NEGATIVE Final    Comment:        The GeneXpert MRSA Assay (FDA approved for NASAL specimens only), is one component of a comprehensive MRSA colonization surveillance program. It is not intended to diagnose MRSA infection nor to guide or monitor treatment for MRSA infections.    Studies/Results: Dg Abd 1 View  10/14/2014   CLINICAL DATA:  Acute onset of upper abdominal pain and nausea. Known pancreatitis. Initial encounter.  EXAM: ABDOMEN - 1 VIEW  COMPARISON:  Abdominal radiograph performed 10/12/2014  FINDINGS: The visualized bowel gas pattern is unremarkable. Scattered air and stool filled loops of colon are seen; no abnormal dilatation of small bowel loops is seen to suggest small bowel obstruction. No free intra-abdominal air is identified, though evaluation for free air is limited on a single supine view.  The visualized osseous structures are within normal limits; the sacroiliac joints are unremarkable in appearance.  IMPRESSION: Unremarkable bowel gas pattern; no free intra-abdominal air seen. Small amount of stool noted in the colon. The colon is largely filled with air.   Electronically Signed   By: [name] M.D.   On: 10/14/2014 02:59   US Abdomen Limited Ruq  10/14/2014   CLINICAL DATA:  Elevated liver enzymes  EXAM: US ABDOMEN LIMITED - RIGHT UPPER QUADRANT  COMPARISON:  None.  FINDINGS: Gallbladder:  Mild sludge within the gallbladder lumen. No calculi are evident. Normal gallbladder wall thickness. Non tender over the gallbladder.  Common bile duct:  Diameter: 3.6 mm  Liver:  There is generalized echogenicity of hepatic parenchyma consistent with fatty infiltration. There is no focal liver lesion.  IMPRESSION: Fatty liver.  Small amount of gallbladder intraluminal sludge.   Electronically Signed   By: [name] M.D.   On: 10/14/2014 02:14   Medications: I have reviewed the patient's current medications. Scheduled Meds: . amLODipine  10 mg Oral Daily  .  enoxaparin (LOVENOX) injection  40 mg Subcutaneous Q24H  . folic acid  1 mg Oral Daily  . ipratropium-albuterol  3 mL Nebulization Q4H  . multivitamin with minerals  1 tablet Oral Daily  . nicotine  21 mg Transdermal Daily  . pantoprazole (PROTONIX) IV  40 mg Intravenous Q24H  . polyethylene glycol  17 g Oral BID  . senna-docusate  1 tablet Oral BID  . thiamine  100 mg Oral Daily   Or  . thiamine  100 mg Intravenous Daily   Continuous Infusions: . dextrose 5 % and 0.9% NaCl 200 mL/hr at 10/15/14 0640   PRN Meds:.albuterol, HYDROmorphone (DILAUDID) injection, LORazepam **OR** LORazepam, magnesium hydroxide, ondansetron Assessment/Plan: Active Problems:   Alcoholic pancreatitis   Pancreatitis  Acute pancreatitis: Persistent epigastric pain today not much improved since initial admission. Not yet tolerating orals. Likely secondary to alcohol use. Ranson score of  0 (no LVH) indicating a low risk for mortality. -D5 normal saline at 200 mL per hour (given anion gap metabolic acidosis as described below). Will switch to normal saline if gap has closed. -Dilaudid 1 mg every 4 hours when necessary -Trial advancement to clears -No need for further imaging at this point given little evidence of complications. -Urinalysis pending -Protonix 40 mg daily to address any possible concurrent alcoholic gastritis  Alcohol abuse: Minimal CIWA scores. Admission alcohol yesterday was negative. -CIWA protocol -Magnesium and phosphorus pending  Anion gap metabolic acidosis: Labs pending today. On admission, anion gap of 20 with a delta/delta ratio of 3.33 suggesting a gap metabolic acidosis with concurrent metabolic alkalosis. Gap metabolic acidosis likely due to starvation/alcoholic ketoacidosis. Metabolic alkalosis likely secondary to emesis. Urinalysis consistent as there are times. -D5 normal saline at 200 mL/h  Mildly elevated AST and ALT: Check liver enzymes pending today. AST of 100 and ALT of 81.  No concurrent rise in alkaline phosphatase. Total bilirubin is elevated at 1.9. Ultrasound only remarkable for small amount of gallbladder intraluminal sludge. -Hepatitis panel pending  Hypertension: Continues to be hypertensive. At home, patient is on hydrochlorothiazide 25 mg daily. -Hold home hydrochlorothiazide given possible association with pancreatitis. -Started amlodipine 10 mg daily this AM -Will focus on better pain control at this point.  Asthma: Wheezing on exam today.  -Continue home albuterol -Scheduled DuoNebs 44 treatments  Diet: Clears Prophylaxis: Lovenox 40 mg daily  Code: Full  Dispo: Disposition is deferred at this time, awaiting improvement of current medical problems. Anticipated discharge in approximately 3 day(s).   The patient does have a current PCP (Alexa Dulcy Fanny, MD) and does need an [facility] hospital follow-up appointment after discharge.  The patient does not have transportation limitations that hinder transportation to clinic appointments.   LOS: 1 day   Services Needed at time of discharge: Y = Yes, Blank = No PT:   OT:   RN:   Equipment:   Other:    Harold Barban, MD 10/15/2014, 11:40 AM

## 2014-10-16 NOTE — Discharge Summary (Signed)
Patient left AMA.   Name: Riley Kim MRN: [id number] DOB: 28-Jun-1975 39 y.o. PCP: [name], MD  Date of Admission: 10/14/2014  1:09 AM Date of Discharge: 10/16/2014 Attending Physician: Dr. [name]  Discharge Diagnosis: Active Problems:   Alcoholic pancreatitis   Pancreatitis  Discharge Medications:   Medication List    ASK your doctor about these medications        acetaminophen 500 MG tablet  Commonly known as:  TYLENOL  Take 500 mg by mouth every 6 (six) hours as needed for moderate pain.     albuterol 108 (90 BASE) MCG/ACT inhaler  Commonly known as:  PROVENTIL HFA;VENTOLIN HFA  Inhale 2 puffs into the lungs every 6 (six) hours as needed for wheezing or shortness of breath.     folic acid 1 MG tablet  Commonly known as:  FOLVITE  Take 1 tablet (1 mg total) by mouth daily.     hydrochlorothiazide 25 MG tablet  Commonly known as:  HYDRODIURIL  Take 25 mg by mouth daily.     HYDROcodone-acetaminophen 5-325 MG per tablet  Commonly known as:  NORCO/VICODIN  Take 2 tablets by mouth every 4 (four) hours as needed.     ondansetron 4 MG disintegrating tablet  Commonly known as:  ZOFRAN ODT  Take 1 tablet (4 mg total) by mouth every 8 (eight) hours as needed for nausea or vomiting.     ondansetron 4 MG tablet  Commonly known as:  ZOFRAN  Take 1 tablet (4 mg total) by mouth every 6 (six) hours.     OVER THE COUNTER MEDICATION  Take 2 tablets by mouth at bedtime as needed (For Sleep).     promethazine 25 MG suppository  Commonly known as:  PHENERGAN  Place 1 suppository (25 mg total) rectally every 6 (six) hours as needed for nausea.     sucralfate 1 G tablet  Commonly known as:  CARAFATE  Take 1 tablet (1 g total) by mouth 4 (four) times daily -  with meals and at bedtime.        Disposition and follow-up:   Patient left AMA.   Acute pancreatitis: Would recommend that patient transition from hydrochlorothiazide to amlodipine in the  setting of recurrent pancreatitis.  Mildly elevated AST and ALT:  An acute hepatitis panel is still pending at the time of discharge AGAINST MEDICAL ADVICE.  2.  Labs / imaging needed at time of follow-up: none  3.  Pending labs/ test needing follow-up: acute hepatitis panel  Follow-up Appointments:   Discharge Instructions:   Consultations:    Procedures Performed:  Dg Abd 1 View  10/14/2014   CLINICAL DATA:  Acute onset of upper abdominal pain and nausea. Known pancreatitis. Initial encounter.  EXAM: ABDOMEN - 1 VIEW  COMPARISON:  Abdominal radiograph performed 10/12/2014  FINDINGS: The visualized bowel gas pattern is unremarkable. Scattered air and stool filled loops of colon are seen; no abnormal dilatation of small bowel loops is seen to suggest small bowel obstruction. No free intra-abdominal air is identified, though evaluation for free air is limited on a single supine view.  The visualized osseous structures are within normal limits; the sacroiliac joints are unremarkable in appearance.  IMPRESSION: Unremarkable bowel gas pattern; no free intra-abdominal air seen. Small amount of stool noted in the colon. The colon is largely filled with air.   Electronically Signed   By: [name] M.D.   On: 10/14/2014 02:59   Dg Abd 1  View  10/12/2014   CLINICAL DATA:  Abdominal pain and vomiting for 2 days  EXAM: ABDOMEN - 1 VIEW  COMPARISON:  CT 12/29/2013  FINDINGS: The bowel gas pattern is normal. No radio-opaque calculi or other significant radiographic abnormality are seen.  IMPRESSION: Negative.   Electronically Signed   By: [name] M.D.   On: 10/12/2014 23:49   US Abdomen Limited Ruq  10/14/2014   CLINICAL DATA:  Elevated liver enzymes  EXAM: US ABDOMEN LIMITED - RIGHT UPPER QUADRANT  COMPARISON:  None.  FINDINGS: Gallbladder:  Mild sludge within the gallbladder lumen. No calculi are evident. Normal gallbladder wall thickness. Non tender over the gallbladder.  Common bile  duct:  Diameter: 3.6 mm  Liver:  There is generalized echogenicity of hepatic parenchyma consistent with fatty infiltration. There is no focal liver lesion.  IMPRESSION: Fatty liver.  Small amount of gallbladder intraluminal sludge.   Electronically Signed   By: [name] M.D.   On: 10/14/2014 02:14    2D Echo: none  Cardiac Cath: none  Admission HPI:   Patient is a 39 year old gentleman with a history of alcohol use and recurrent pancreatitis who presents with epigastric abdominal pain. He states that the onset of pain was around one week ago. It is sharp in quality, severe, and radiates to both of his flanks. He states that the pain comes and goes. It is associated with nausea with several episodes of emesis over the last several days. He states that this feels similar to his previous episodes of pancreatitis. He also states that he has been constipated but has not had a bowel movement in the last week and half. He states that his last drink of alcohol was around one week ago. He has been to the emergency room 2 times within the last week for evaluation of the same symptoms. At baseline, he can drink as much as half a pint of liquor a day. He has been scheduled to start alcohol rehabilitation on Monday. He denies any history of alcohol withdrawal symptoms.  Otherwise, patient denies any fevers, chills, chest pain, dyspnea, dysuria, hematuria.   Hospital Course by problem list: Active Problems:   Alcoholic pancreatitis   Pancreatitis   Acute pancreatitis: Patient presenting with epigastric pain with an elevated lipase of 1077 consistent with previous episodes of acute pancreatitis likely secondary to alcohol use. Other etiologies less likely given no evidence of hypertriglyceridemia in the past and no evidence of gallstones on right upper quadrant ultrasound. Patient's epigastric pain remained unimproved through hospital day 2 on Dilaudid 1 mg every 4 hours when necessary. Trial to advance  to clears was also not yet successful. Patient was started on Protonix 40 mg daily for possible concurrent alcoholic gastritis. Patient's anion gap metabolic acidosis resolved after fluid resuscitation with D5 normal saline. Overall Ranson score of 0 (no LVH) indicating low risk for mortality. No further imaging was required given little evidence of complications. However, patient left the hospital at around 1 AM on 10/16/2014 AGAINST MEDICAL ADVICE. Reports are that patient said that he was allowed to leave by some provider although no order or intention of discharge existed. Would recommend that patient transition from hydrochlorothiazide to amlodipine in the setting of recurrent pancreatitis.  Alcohol abuse:  Patient with minimal CIWA scores throughout his admission and was planning on initiating alcoholic rehabilitation as an outpatient starting on 10/16/2014 prior to admission.  Mildly elevated AST and ALT:  Initial elevated AST and ALT of 219 and  104 respectively. They trended down by hospital day 2 to within normal limits. An acute hepatitis panel is still pending at the time of discharge AGAINST MEDICAL ADVICE.  Hypertension:  Patient converted from hydrochlorothiazide 25 mg daily to amlodipine 10 mg daily given history of recurrent pancreatitis. Would ensure that patient has proper antihypertensives regimen given his leaving AMA.  Asthma: Patient with some wheezing and was given DuoNeb treatments as an inpatient.   Discharge Vitals:   BP 162/107 mmHg  Pulse 91  Temp(Src) 98 F (36.7 C) (Oral)  Resp 20  Ht  (1.88 m)  Wt 211 lb (95.709 kg)  BMI 27.08 kg/m2  SpO2 100%  Discharge Labs:  Results for orders placed or performed during the hospital encounter of 10/14/14 (from the past 24 hour(s))  CBC     Status: Abnormal   Collection Time: 10/15/14  1:57 PM  Result Value Ref Range   WBC 9.6 4.0 - 10.5 K/uL   RBC 4.94 4.22 - 5.81 MIL/uL   Hemoglobin 12.4 (L) 13.0 - 17.0 g/dL   HCT  16.1 (L) 09.6 - 52.0 %   MCV 74.7 (L) 78.0 - 100.0 fL   MCH 25.1 (L) 26.0 - 34.0 pg   MCHC 33.6 30.0 - 36.0 g/dL   RDW 04.5 40.9 - 81.1 %   Platelets 142 (L) 150 - 400 K/uL  Comprehensive metabolic panel     Status: Abnormal   Collection Time: 10/15/14  1:57 PM  Result Value Ref Range   Sodium 131 (L) 135 - 145 mmol/L   Potassium 3.1 (L) 3.5 - 5.1 mmol/L   Chloride 95 (L) 101 - 111 mmol/L   CO2 26 22 - 32 mmol/L   Glucose, Bld 131 (H) 65 - 99 mg/dL   BUN <5 (L) 6 - 20 mg/dL   Creatinine, Ser 9.14 (L) 0.61 - 1.24 mg/dL   Calcium 9.4 8.9 - 78.2 mg/dL   Total Protein 6.7 6.5 - 8.1 g/dL   Albumin 3.2 (L) 3.5 - 5.0 g/dL   AST 41 15 - 41 U/L   ALT 36 17 - 63 U/L   Alkaline Phosphatase 72 38 - 126 U/L   Total Bilirubin 1.0 0.3 - 1.2 mg/dL   GFR calc non Af Amer >60 >60 mL/min   GFR calc Af Amer >60 >60 mL/min   Anion gap 10 5 - 15  Lipase, blood     Status: Abnormal   Collection Time: 10/15/14  1:57 PM  Result Value Ref Range   Lipase 152 (H) 22 - 51 U/L  Magnesium     Status: None   Collection Time: 10/15/14  1:57 PM  Result Value Ref Range   Magnesium 1.7 1.7 - 2.4 mg/dL  Phosphorus     Status: None   Collection Time: 10/15/14  1:57 PM  Result Value Ref Range   Phosphorus 3.0 2.5 - 4.6 mg/dL    Signed: Harold Barban, MD 10/16/2014, 12:52 PM    Services Ordered on Discharge: none Equipment Ordered on Discharge: none

## 2014-10-16 NOTE — Progress Notes (Signed)
IMTS Daily Progress Note  Patient left AMA this morning around 1 AM. Please refer to note from Dr. Danella Penton from earlier this morning.   Harold Barban, MD, PhD Internal Medicine Teaching Service

## 2014-10-16 NOTE — Progress Notes (Signed)
Paged by RN stating that pt wanting to leave AMA. He already signed AMA papers. Told RN he has an appt tomorrow morning to get to and that his ride is on the way to the hospital. Riley Kim up to the floor to speak to the patient but he already left.   [name], MD Internal Medicine Resident, PGY [facility] Internal Medicine Program Pager: [phone]

## 2014-10-18 LAB — HEPATITIS PANEL, ACUTE
Hep A IgM: NEGATIVE
Hep B C IgM: NEGATIVE
Hepatitis B Surface Ag: NEGATIVE

## 2015-04-10 ENCOUNTER — Encounter (HOSPITAL_COMMUNITY): Payer: Self-pay | Admitting: Emergency Medicine

## 2015-04-10 ENCOUNTER — Emergency Department (HOSPITAL_COMMUNITY)
Admission: EM | Admit: 2015-04-10 | Discharge: 2015-04-10 | Disposition: A | Discharge status: Home / Self Care | Payer: Medicaid Other | Attending: Emergency Medicine | Admitting: Emergency Medicine

## 2015-04-10 DIAGNOSIS — Z880 Allergy status to penicillin: Secondary | ICD-10-CM | POA: Insufficient documentation

## 2015-04-10 DIAGNOSIS — J45909 Unspecified asthma, uncomplicated: Secondary | ICD-10-CM | POA: Insufficient documentation

## 2015-04-10 DIAGNOSIS — Z79899 Other long term (current) drug therapy: Secondary | ICD-10-CM | POA: Insufficient documentation

## 2015-04-10 DIAGNOSIS — R1084 Generalized abdominal pain: Secondary | ICD-10-CM

## 2015-04-10 DIAGNOSIS — M1990 Unspecified osteoarthritis, unspecified site: Secondary | ICD-10-CM | POA: Insufficient documentation

## 2015-04-10 DIAGNOSIS — F17210 Nicotine dependence, cigarettes, uncomplicated: Secondary | ICD-10-CM | POA: Insufficient documentation

## 2015-04-10 LAB — CBC
HEMATOCRIT: 37.1 % — AB (ref 39.0–52.0)
Hemoglobin: 12 g/dL — ABNORMAL LOW (ref 13.0–17.0)
MCH: 24.7 pg — ABNORMAL LOW (ref 26.0–34.0)
MCHC: 32.3 g/dL (ref 30.0–36.0)
MCV: 76.3 fL — ABNORMAL LOW (ref 78.0–100.0)
PLATELETS: 202 10*3/uL (ref 150–400)
RBC: 4.86 MIL/uL (ref 4.22–5.81)
RDW: 14.5 % (ref 11.5–15.5)
WBC: 7.9 10*3/uL (ref 4.0–10.5)

## 2015-04-10 LAB — COMPREHENSIVE METABOLIC PANEL
ALT: 29 U/L (ref 17–63)
AST: 37 U/L (ref 15–41)
Albumin: 4.3 g/dL (ref 3.5–5.0)
Alkaline Phosphatase: 46 U/L (ref 38–126)
Anion gap: 8 (ref 5–15)
BUN: 8 mg/dL (ref 6–20)
CO2: 24 mmol/L (ref 22–32)
CREATININE: 0.72 mg/dL (ref 0.61–1.24)
Calcium: 10 mg/dL (ref 8.9–10.3)
Chloride: 103 mmol/L (ref 101–111)
GFR calc Af Amer: >60 mL/min (ref >60)
Glucose, Bld: 95 mg/dL (ref 65–99)
POTASSIUM: 3.8 mmol/L (ref 3.5–5.1)
SODIUM: 135 mmol/L (ref 135–145)
TOTAL PROTEIN: 7.5 g/dL (ref 6.5–8.1)
Total Bilirubin: 0.8 mg/dL (ref 0.3–1.2)

## 2015-04-10 LAB — LIPASE, BLOOD: LIPASE: 35 U/L (ref 11–51)

## 2015-04-10 MED ORDER — RANITIDINE HCL 150 MG/10ML PO SYRP
300.0000 mg | ORAL_SOLUTION | Freq: Once | ORAL | Status: AC
  Administered 2015-04-10: 300 mg via ORAL
  Filled 2015-04-10 (×2): qty 20

## 2015-04-10 MED ORDER — ALBUTEROL SULFATE (2.5 MG/3ML) 0.083% IN NEBU
INHALATION_SOLUTION | RESPIRATORY_TRACT | Status: AC
  Administered 2015-04-10: 5 mg
  Filled 2015-04-10: qty 6

## 2015-04-10 MED ORDER — SUCRALFATE 1 GM/10ML PO SUSP
1.0000 g | Freq: Once | ORAL | Status: AC
  Administered 2015-04-10: 1 g via ORAL
  Filled 2015-04-10: qty 10

## 2015-04-10 NOTE — Discharge Instructions (Signed)
Abdominal Pain, Adult Riley Kim,See a primary care physician within 3 days for close follow-up. Do not drink any alcohol at all as this will make your abdominal pain worse. Return to emergency department immediately for any worsening symptoms. Thank you. Many things can cause belly (abdominal) pain. Most times, the belly pain is not dangerous. Many cases of belly pain can be watched and treated at home. HOME CARE   Do not take medicines that help you go poop (laxatives) unless told to by your doctor.  Only take medicine as told by your doctor.  Eat or drink as told by your doctor. Your doctor will tell you if you should be on a special diet. GET HELP IF:  You do not know what is causing your belly pain.  You have belly pain while you are sick to your stomach (nauseous) or have runny poop (diarrhea).  You have pain while you pee or poop.  Your belly pain wakes you up at night.  You have belly pain that gets worse or better when you eat.  You have belly pain that gets worse when you eat fatty foods.  You have a fever. GET HELP RIGHT AWAY IF:   The pain does not go away within 2 hours.  You keep throwing up (vomiting).  The pain changes and is only in the right or left part of the belly.  You have bloody or tarry looking poop. MAKE SURE YOU:   Understand these instructions.  Will watch your condition.  Will get help right away if you are not doing well or get worse.   This information is not intended to replace advice given to you by your health care provider. Make sure you discuss any questions you have with your health care provider.   Document Released: 10/08/2007 Document Revised: 05/12/2014 Document Reviewed: 12/29/2012 Elsevier Interactive Patient Education [employer]2016 [employer] Inc.

## 2015-04-10 NOTE — ED Notes (Signed)
Oni MD at bedside  

## 2015-04-10 NOTE — ED Notes (Signed)
Pt. reports asthma attack onset yesterday with dry cough and wheezing , pt. also articulated pancreatitis flare up with upper abdominal pain and mild nausea onset yesterday , denies emesis or diarrhea.

## 2015-04-10 NOTE — ED Notes (Signed)
Per EMT at [facility], pt was asleep in the lobby and asked to leave by [employer]. Pt then decided to check in. Unclear why patient was in lobby in the first place??

## 2015-04-10 NOTE — ED Notes (Addendum)
Upon entering patient's room, initial request from patient was to turn out the lights and to let him sleep. Pt informed that this RN needed to perform her assessment and then the doctor would need to come in to assess him as well.

## 2015-04-10 NOTE — ED Provider Notes (Signed)
CSN: [id number]     Arrival date & time 04/10/15  0055 History  By signing my name below, I, Freida Busmaniana Omoyeni, attest that this documentation has been prepared under the direction and in the presence of Tomasita CrumbleAdeleke Bryttany Tortorelli, MD . Electronically Signed: Freida Busmaniana Omoyeni, Scribe. 04/10/2015. 3:27 AM.    Chief Complaint  Patient presents with  . Asthma  . Abdominal Pain    The history is provided by the patient. No language interpreter was used.   HPI Comments:  Riley Kim is a 39 y.o. male with a history of pancreatitis, who presents to the Emergency Department complaining of LUQ abdominal pain which began a few days ago. He notes his pain is similar to past bouts of pancreatitis. He states he has decreased his ETOH consumption since he was first diagnosed but admits he had 1 beer yesterday AM. No alleviating factors noted. Pt has no other complaints or symptoms at this time.   Past Medical History  Diagnosis Date  . Asthma   . Vertigo   . 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 Arthritis     "right foot, I broke it before"   Past Surgical History  Procedure Laterality Date  . Replantation thumb Right 2000   No family history on file. Social History  Substance Use Topics  . Smoking status: Current Every Day Smoker -- 0.00 packs/day for 0 years    Types: Cigarettes  . Smokeless tobacco: Never Used     Comment: cutting back  . Alcohol Use: Yes     Comment: Drink daily.    Review of Systems 10 systems reviewed and all are negative for acute change except as noted in the HPI.  Allergies  Penicillins and Lisinopril  Home Medications   Prior to Admission medications   Medication Sig Start Date End Date Taking? Authorizing Provider  albuterol (PROVENTIL HFA;VENTOLIN HFA) 108 (90 BASE) MCG/ACT inhaler Inhale 2 puffs into the lungs every 6 (six) hours as needed for wheezing or shortness of breath. Patient not taking: Reported on 10/10/2014 07/26/14    Alexa Dulcy FannyM Richardson, MD  folic acid (FOLVITE) 1 MG tablet Take 1 tablet (1 mg total) by mouth daily. Patient not taking: Reported on 10/10/2014 07/26/14   Alexa Dulcy FannyM Richardson, MD  HYDROcodone-acetaminophen (NORCO/VICODIN) 5-325 MG per tablet Take 2 tablets by mouth every 4 (four) hours as needed. Patient not taking: Reported on 04/10/2015 10/13/14   Catha GosselinHanna Patel-Mills, PA-C  ondansetron (ZOFRAN ODT) 4 MG disintegrating tablet Take 1 tablet (4 mg total) by mouth every 8 (eight) hours as needed for nausea or vomiting. Patient not taking: Reported on 04/10/2015 10/10/14   Shon Batonourtney F Horton, MD  ondansetron (ZOFRAN) 4 MG tablet Take 1 tablet (4 mg total) by mouth every 6 (six) hours. Patient not taking: Reported on 10/10/2014 07/26/14   Alexa Dulcy FannyM Richardson, MD  promethazine (PHENERGAN) 25 MG suppository Place 1 suppository (25 mg total) rectally every 6 (six) hours as needed for nausea. Patient not taking: Reported on 08/03/2014 06/04/14   Derwood KaplanAnkit Nanavati, MD  sucralfate (CARAFATE) 1 G tablet Take 1 tablet (1 g total) by mouth 4 (four) times daily -  with meals and at bedtime. Patient not taking: Reported on 04/10/2015 10/10/14   Shon Batonourtney F Horton, MD   BP 119/81 mmHg  Pulse 100  Resp 16  SpO2 98% Physical Exam  Constitutional: He is oriented to person, place, and time. Vital signs are normal. He appears  well-developed and well-nourished.  Non-toxic appearance. He does not appear ill. No distress.  HENT:  Head: Normocephalic and atraumatic.  Nose: Nose normal.  Mouth/Throat: Oropharynx is clear and moist. No oropharyngeal exudate.  Eyes: Conjunctivae and EOM are normal. Pupils are equal, round, and reactive to light. No scleral icterus.  Neck: Normal range of motion. Neck supple. No tracheal deviation, no edema, no erythema and normal range of motion present. No thyroid mass and no thyromegaly present.  Cardiovascular: Normal rate, regular rhythm, S1 normal, S2 normal, normal heart sounds, intact distal pulses and  normal pulses.  Exam reveals no gallop and no friction rub.   No murmur heard. Pulmonary/Chest: Effort normal and breath sounds normal. No respiratory distress. He has no wheezes. He has no rhonchi. He has no rales.  Abdominal: Soft. Normal appearance and bowel sounds are normal. He exhibits no distension, no ascites and no mass. There is no hepatosplenomegaly. There is no tenderness. There is no rebound, no guarding and no CVA tenderness.  Musculoskeletal: Normal range of motion. He exhibits no edema or tenderness.  Lymphadenopathy:    He has no cervical adenopathy.  Neurological: He is alert and oriented to person, place, and time. He has normal strength. No cranial nerve deficit or sensory deficit.  Skin: Skin is warm, dry and intact. No petechiae and no rash noted. He is not diaphoretic. No erythema. No pallor.  Psychiatric: He has a normal mood and affect. His behavior is normal. Judgment normal.  Nursing note and vitals reviewed.   ED Course  Procedures   DIAGNOSTIC STUDIES:  Oxygen Saturation is 98% on RA, normal by my interpretation.    COORDINATION OF CARE:  3:27 AM Discussed treatment plan with pt at bedside and pt agreed to plan.  Labs Review Labs Reviewed  CBC - Abnormal; Notable for the following:    Hemoglobin 12.0 (*)    HCT 37.1 (*)    MCV 76.3 (*)    MCH 24.7 (*)    All other components within normal limits  LIPASE, BLOOD  COMPREHENSIVE METABOLIC PANEL  URINALYSIS, ROUTINE W REFLEX MICROSCOPIC (NOT AT [facility])    Imaging Review No results found. I have personally reviewed and evaluated these images and lab results as part of my medical decision-making.   EKG Interpretation None      MDM   Final diagnoses:  None   Patient presents to emergency department for abdominal pain. He states is consistent with pancreatitis and he had alcohol yesterday. Pain radiates to his back. He denies any vomiting but he has had nausea. Has no diarrhea or changes in his  urination. She has notes problems with asthma however he states it has come back to normal and there is no wheezing on my exam. He has albuterol at home. He was given sucralfate and ranitidine for possible gastritis. He appears well and in no acute distress, support counseling the room. Vital signs were within his normal limits and he is safe for discharge.    I personally performed the services described in this documentation, which was scribed in my presence. The recorded information has been reviewed and is accurate.      Tomasita Crumble, MD 04/10/15 [phone]

## 2015-06-04 ENCOUNTER — Emergency Department (HOSPITAL_COMMUNITY)
Admission: EM | Admit: 2015-06-04 | Discharge: 2015-06-04 | Disposition: A | Discharge status: Home / Self Care | Payer: Medicaid Other | Attending: Emergency Medicine | Admitting: Emergency Medicine

## 2015-06-04 ENCOUNTER — Encounter (HOSPITAL_COMMUNITY): Payer: Self-pay | Admitting: Emergency Medicine

## 2015-06-04 DIAGNOSIS — R059 Cough, unspecified: Secondary | ICD-10-CM

## 2015-06-04 DIAGNOSIS — R062 Wheezing: Secondary | ICD-10-CM

## 2015-06-04 DIAGNOSIS — Z8739 Personal history of other diseases of the musculoskeletal system and connective tissue: Secondary | ICD-10-CM | POA: Insufficient documentation

## 2015-06-04 DIAGNOSIS — R51 Headache: Secondary | ICD-10-CM | POA: Insufficient documentation

## 2015-06-04 DIAGNOSIS — J45901 Unspecified asthma with (acute) exacerbation: Secondary | ICD-10-CM | POA: Insufficient documentation

## 2015-06-04 DIAGNOSIS — Z8719 Personal history of other diseases of the digestive system: Secondary | ICD-10-CM | POA: Insufficient documentation

## 2015-06-04 DIAGNOSIS — Z79899 Other long term (current) drug therapy: Secondary | ICD-10-CM | POA: Insufficient documentation

## 2015-06-04 DIAGNOSIS — F17210 Nicotine dependence, cigarettes, uncomplicated: Secondary | ICD-10-CM | POA: Insufficient documentation

## 2015-06-04 DIAGNOSIS — R05 Cough: Secondary | ICD-10-CM

## 2015-06-04 DIAGNOSIS — Z880 Allergy status to penicillin: Secondary | ICD-10-CM | POA: Insufficient documentation

## 2015-06-04 MED ORDER — ALBUTEROL SULFATE HFA 108 (90 BASE) MCG/ACT IN AERS
2.0000 | INHALATION_SPRAY | Freq: Once | RESPIRATORY_TRACT | Status: AC
  Administered 2015-06-04: 2 via RESPIRATORY_TRACT
  Filled 2015-06-04: qty 6.7

## 2015-06-04 MED ORDER — BENZONATATE 100 MG PO CAPS: 200.0000 mg | ORAL_CAPSULE | Freq: Two times a day (BID) | ORAL | Status: DC | PRN

## 2015-06-04 MED ORDER — OXYMETAZOLINE HCL 0.05 % NA SOLN: 1.0000 | Freq: Two times a day (BID) | NASAL | Status: DC

## 2015-06-04 MED ORDER — IPRATROPIUM-ALBUTEROL 0.5-2.5 (3) MG/3ML IN SOLN
3.0000 mL | Freq: Once | RESPIRATORY_TRACT | Status: AC
  Administered 2015-06-04: 3 mL via RESPIRATORY_TRACT
  Filled 2015-06-04: qty 3

## 2015-06-04 NOTE — ED Notes (Signed)
Pt sts HA, body aches, fever and increased asthma sx x 10 days

## 2015-06-04 NOTE — ED Provider Notes (Signed)
CSN: [id number]     Arrival date & time 06/04/15  1018 History   First MD Initiated Contact with Patient 06/04/15 1113     Chief Complaint  Patient presents with  . Headache  . Asthma     (Consider location/radiation/quality/duration/timing/severity/associated sxs/prior Treatment) HPI   Patient is a 40 year old male past medical history of asthma presents the ED with complaint of body aches, onset 10 days. Patient reports over the past week and a half he has been having chills, subjective fever, body aches, headache, nasal congestion, sore throat, productive cough, wheezing and shortness of breath. Patient states his fever, chills, body aches have persisted this week and notes his wheezing, cough and SOB have worsened. He notes he has been taking Thermaflu without relief. Patient reports he is out of his albuterol inhaler. Denies visual changes, lightheadedness, dizziness, chest pain, palpitations, abdominal pain, nausea, vomiting, diarrhea, urinary symptoms, numbness, tingling, weakness, syncope.   Past Medical History  Diagnosis Date  . Asthma   . Vertigo   . 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 Arthritis     "right foot, I broke it before"   Past Surgical History  Procedure Laterality Date  . Replantation thumb Right 2000   History reviewed. No pertinent family history. Social History  Substance Use Topics  . Smoking status: Current Every Day Smoker -- 0.00 packs/day for 0 years    Types: Cigarettes  . Smokeless tobacco: Never Used     Comment: cutting back  . Alcohol Use: Yes     Comment: Drink daily.    Review of Systems  Constitutional: Positive for fever (subjective) and chills.  HENT: Positive for congestion and sore throat.   Respiratory: Positive for cough, shortness of breath and wheezing.   Neurological: Positive for headaches.  All other systems reviewed and are negative.     Allergies  Penicillins and  Lisinopril  Home Medications   Prior to Admission medications   Medication Sig Start Date End Date Taking? Authorizing Provider  albuterol (PROVENTIL HFA;VENTOLIN HFA) 108 (90 BASE) MCG/ACT inhaler Inhale 2 puffs into the lungs every 6 (six) hours as needed for wheezing or shortness of breath. 07/26/14  Yes Alexa Dulcy Fanny, MD  Chlorphen-Pseudoephed-APAP (THERAFLU FLU/COLD PO) Take 1 tablet by mouth every 6 (six) hours as needed (cold/flu symptoms).   Yes Historical Provider, MD  benzonatate (TESSALON) 100 MG capsule Take 2 capsules (200 mg total) by mouth 2 (two) times daily as needed for cough. 06/04/15   Barrett Henle, PA-C  folic acid (FOLVITE) 1 MG tablet Take 1 tablet (1 mg total) by mouth daily. Patient not taking: Reported on 10/10/2014 07/26/14   Alexa Dulcy Fanny, MD  HYDROcodone-acetaminophen (NORCO/VICODIN) 5-325 MG per tablet Take 2 tablets by mouth every 4 (four) hours as needed. Patient not taking: Reported on 04/10/2015 10/13/14   Catha Gosselin, PA-C  ondansetron (ZOFRAN ODT) 4 MG disintegrating tablet Take 1 tablet (4 mg total) by mouth every 8 (eight) hours as needed for nausea or vomiting. Patient not taking: Reported on 04/10/2015 10/10/14   Shon Baton, MD  ondansetron (ZOFRAN) 4 MG tablet Take 1 tablet (4 mg total) by mouth every 6 (six) hours. Patient not taking: Reported on 10/10/2014 07/26/14   Alexa Dulcy Fanny, MD  oxymetazoline (AFRIN NASAL SPRAY) 0.05 % nasal spray Place 1 spray into both nostrils 2 (two) times daily. 06/04/15   Barrett Henle, PA-C  promethazine (PHENERGAN) 25  MG suppository Place 1 suppository (25 mg total) rectally every 6 (six) hours as needed for nausea. Patient not taking: Reported on 08/03/2014 06/04/14   Derwood Kaplan, MD  sucralfate (CARAFATE) 1 G tablet Take 1 tablet (1 g total) by mouth 4 (four) times daily -  with meals and at bedtime. Patient not taking: Reported on 04/10/2015 10/10/14   Shon Baton, MD   BP  125/97 mmHg  Pulse 85  Temp(Src) 98.4 F (36.9 C) (Oral)  Resp 18  SpO2 100% Physical Exam  Constitutional: He is oriented to person, place, and time. He appears well-developed and well-nourished. No distress.  HENT:  Head: Normocephalic and atraumatic.  Right Ear: Tympanic membrane normal.  Left Ear: Tympanic membrane normal.  Nose: Nose normal. Right sinus exhibits no maxillary sinus tenderness and no frontal sinus tenderness. Left sinus exhibits no maxillary sinus tenderness and no frontal sinus tenderness.  Mouth/Throat: Uvula is midline, oropharynx is clear and moist and mucous membranes are normal. No oropharyngeal exudate.  Eyes: Conjunctivae and EOM are normal. Pupils are equal, round, and reactive to light. Right eye exhibits no discharge. Left eye exhibits no discharge. No scleral icterus.  Neck: Normal range of motion. Neck supple.  Cardiovascular: Normal rate, regular rhythm, normal heart sounds and intact distal pulses.   Pulmonary/Chest: Effort normal. No respiratory distress. He has wheezes (scattered expiratory wheezes). He has no rales. He exhibits no tenderness.  Abdominal: Soft. Bowel sounds are normal. He exhibits no distension and no mass. There is no tenderness. There is no rebound and no guarding.  Musculoskeletal: Normal range of motion. He exhibits no edema.  Lymphadenopathy:    He has no cervical adenopathy.  Neurological: He is alert and oriented to person, place, and time.  Skin: Skin is warm and dry.  Nursing note and vitals reviewed.   ED Course  Procedures (including critical care time) Labs Review Labs Reviewed - No data to display  Imaging Review No results found. I have personally reviewed and evaluated these images and lab results as part of my medical decision-making.  Filed Vitals:   06/04/15 1100 06/04/15 1130  BP: 128/92 125/97  Pulse: 85 85  Temp:    Resp:       MDM   Final diagnoses:  Wheezing  Cough    Patient presents with  subjective fever, headache, chills, body aches, nasal congestion, sore throat, cough, wheezing. History of asthma, reports he is out of his albuterol inhaler. VSS. Exam revealed scattered expiratory wheezes, no rales/rhonchi, no signs of respiratory distress, pt able to speak in full sentences. Patient given neb treatment in the ED. On reevaluation patient reports he is feeling much better, wheezing had significantly improved on reevaluation. I suspect patient's symptoms are likely due to viral URI associated with asthma exacerbation and do not feel any further workup or imaging is warranted at this time. Plan to discharge patient home with albuterol inhaler, decongestion and antitussive. Advised patient to follow up with his PCP.  Evaluation does not show pathology requring ongoing emergent intervention or admission. Pt is hemodynamically stable and mentating appropriately. Discussed findings/results and plan with patient/guardian, who agrees with plan. All questions answered. Return precautions discussed and outpatient follow up given.      Satira Sark [place], [place] 06/04/15 1244  Doug Sou, MD 06/04/15 1743

## 2015-06-04 NOTE — Discharge Instructions (Signed)
Take your medications as prescribed. Continue drinking fluids to remain hydrated. Follow-up with your primary care in the next 4-5 days.  Return to the emergency department if symptoms worsen or new onset of fever, shortness of breath, wheezing, productive cough, chest pain, abdominal pain, vomiting, coughing up blood, vomiting blood, lightheadedness, dizziness, syncope, unable to keep fluids/food down.   Emergency Department Resource Guide 1) Find a Doctor and Pay Out of Pocket Although you won't have to find out who is covered by your insurance plan, it is a good idea to ask around and get recommendations. You will then need to call the office and see if the doctor you have chosen will accept you as a new patient and what types of options they offer for patients who are self-pay. Some doctors offer discounts or will set up payment plans for their patients who do not have insurance, but you will need to ask so you aren't surprised when you get to your appointment.  2) Contact Your Local Health Department Not all health departments have doctors that can see patients for sick visits, but many do, so it is worth a call to see if yours does. If you don't know where your local health department is, you can check in your phone book. The CDC also has a tool to help you locate your state's health department, and many state websites also have listings of all of their local health departments.  3) Find a Walk-in Clinic If your illness is not likely to be very severe or complicated, you may want to try a walk in clinic. These are popping up all over the country in pharmacies, drugstores, and shopping centers. They're usually staffed by nurse practitioners or physician assistants that have been trained to treat common illnesses and complaints. They're usually fairly quick and inexpensive. However, if you have serious medical issues or chronic medical problems, these are probably not your best option.  No Primary  Care Doctor: - Call Health Connect at  [phone] - they can help you locate a primary care doctor that  accepts your insurance, provides certain services, etc. - Physician Referral Service- [phone]  Chronic Pain Problems: Organization         Address  Phone   Notes  [facility]  [phone] Patients need to be referred by their primary care doctor.   Medication Assistance: Organization         Address  Phone   Notes  [facility] [address] [place] [place], [place] [address] [phone] --Must be a resident of [facility] -- Must have NO insurance coverage whatsoever (no Medicaid/ Medicare, etc.) -- The pt. MUST have a primary care doctor that directs their care regularly and follows them in the community   [facility]  [phone]   [employer]  [phone]    Agencies that provide inexpensive medical care: Organization         Address  Phone   Notes  [facility]  [phone]   [facility]    [phone]   [facility] [address] [place], [place] [address] [phone]   [facility] [address] [place]. [address] [place] [phone]   [facility]anned Parenthood    [phone]   [facility]    [phone]   [facility] [place] [facility]  [address], [place] Phone:  [phone],  Fax:  [phone] Hours of Operation:  9 am - 6 pm, M-F.  Also accepts Medicaid/Medicare and self-pay.  [facility] for [place], [address], [place] Phone: [phone], Fax: [phone]. Hours of Operation:  8:30 am - 5:30 pm, M-F.  Also accepts Medicaid and self-pay.  [facility] [place] [address] [place] Phone: [phone]   [place], [place], [place] [phone], Ext. [phone] Mondays & Thursdays: 7-9 AM.  First 15 patients are seen on a first  come, first serve basis.    [place] Providers:  Organization         Address  Phone   Notes  [facility] [address], [place] [phone] Also accepts self-pay patients.  [facility] [address], [place]  [phone]   [place], [address], [place] [phone]   [facility] [address] [place] [phone]   Lucianne Lei [address], [place]   [phone] Only accepts [place] Access [place] patients after they have their name applied to their card.   Self-Pay (no insurance) in [facility] [place] [facility]:  Organization         Address  Phone   Notes  Sickle Cell Patients, [facility] Internal Medicine [place] [phone]   [facility] Urgent Care [place] [phone]   [facility] [place]  [place], [place], [place] [phone]   [facility]/Dr. Osei-Bonsu  [address] [place] or [address], [place] [phone] Phone number for both [place] and [place] locations is the same.  [facility] [address] [place] [phone]   [facility] [address], [place] or [address] [phone]   [facility] [address] [place] [phone], phone; [phone], fax Sees patients 1st and 3rd Saturday of every month.  Must not qualify for public or private insurance (i.e. Medicaid, Medicare, [place] Health Choice, Veterans' Benefits)  Household income should be no more than 200% of the poverty level The clinic cannot treat you if you are pregnant or think you are pregnant  Sexually transmitted diseases are not treated at the clinic.    Dental Care: Organization          Address  Phone  Notes  [facility] [place] [phone] Accepts children up to age 36 who are enrolled in [place] or [place]; pregnant women with a Medicaid card; and children who have applied for Medicaid or [place] Health Choice, but were declined, whose parents can pay a reduced fee at time of service.  [facility] of [facility]  [address], [place] [phone] Accepts children up to age 98 who are enrolled in [place] or [place]; pregnant women with a Medicaid card; and children who have applied for Medicaid or [place] Health Choice, but were declined, whose parents can pay a reduced fee at time of service.  [place] [facility] Access [facility]  [place] [phone] Patients are seen by appointment only. Walk-ins are not  accepted. [place] will see patients 13 years of age and older. Monday - Tuesday (8am-5pm) Most Wednesdays (8:30-5pm) $30 per visit, cash only  [facility] Adult [facility] Access [facility]  [address], [facility] [phone] Patients are seen by appointment only. Walk-ins are not accepted. [place] will see patients 33 years of age and older. One Wednesday Evening (Monthly: Volunteer Based).  $30 per visit, cash only  [place]  [phone] for adults; Children under age 30, call Graduate Pediatric Dentistry at [phone]. Children aged 41-14, please call [phone] to request a pediatric application.  Dental services are provided in all areas of dental care including fillings, crowns and bridges, complete and partial dentures, implants, gum treatment, root canals, and extractions. Preventive care is also provided. Treatment is provided to both adults and children. Patients are selected via a lottery and there is often a waiting list.   [facility] [address] [place]  [phone] [email or web address]   [facility] [address] [place], [place] [phone], Ext. [phone] Second and Fourth Thursday of each month, opens at 6:30 AM; Clinic ends at 9 AM.  Patients are seen on a first-come first-served basis, and a limited number are seen during each clinic.   [facility]  [address] Hillard Danker [place], [place] [phone]   Eligibility Requirements You must have lived in [place], [place], or [place] counties for at least the last three months.   You cannot be eligible for state or federal sponsored [employer], including [employer], [place], or [employer].   You generally cannot be eligible for healthcare insurance through your employer.    How to apply: Eligibility screenings are held every Tuesday and Wednesday afternoon from 1:00 pm until 4:00 pm. You do not need an appointment for the interview!  [facility] [address], [place], [place]   [place]  [employer] Department  [place]  [phone]    Behavioral Health Resources in the Community: Intensive Outpatient Programs Organization         Address  Phone  Notes  [place]. [address], [place], [place] [phone]   [facility] Outpatient [address] [place], [place]   ADS: [facility] [address] [place], [place]   [place] [address],  [place], [place] or [phone]   Substance Abuse Resources Organization         Address  Phone  Notes  Alcohol and Drug Services  [phone]   [place]  [phone]   [facility] [place]   Chinita Pester  [phone]   Residential & Outpatient Substance Abuse Program  [phone]   Psychological  Services Organization         Address  Phone  Notes  [facility]  [place]  [phone]   [place] [address], [place] or [phone]    Mobile Crisis Teams Organization         Address  Phone  Notes  [facility], Mobile Crisis Care Unit  [phone]   [facility]  [address] [place], [place]   [facility] [address] [place] [phone]    Self-Help/Support Groups  Organization         Address  Phone             Notes  [facility] [place] - variety of support groups  [phone]- [phone] Call for more information  Narcotics Anonymous (NA), [facility] [address], [employer] [place]  2 meetings at this location   Statistician         Address  Phone  Notes  [facility] [address],    [place]  [phone]   [facility]  [address], [place] [address], [place], [place] [phone]   [facility] Treatment Facility [address] [place], [place] [phone] Admissions: 8am-3pm M-F  [facility] [address],    [place], [place] [phone]   [facility] [address] [place], [place], [place] [phone]   [facility] [address]  [place], [place] [phone]   [facility] - Intensive Outpatient [address]urell [address], [place], [place] [phone]   [facility] [address] [place]  [place], [place] [phone] or [phone]   [facility] ([facility]) [address], [place], [place] [phone] Accepts Medicaid  [facility] [address],  [place] [phone] Substance Abuse/Addiction Treatment   [facility] Organization         Address  Phone  Notes  [facility]  [phone]   Angie Fava, PhD [address] Ervin Knack [place], [place]   [phone] or [phone]   [facility] Behavioral   [address] [place], [place] [phone]   [facility] [address] [place], [place] [phone] Insurance/Medicaid/sponsorship through [facility] and Families [address]                                    [place], [place] [phone] Therapy/tele-psych/case  [facility] [address], [place] [phone]    Dr. Lolly Mustache  [phone]   [facility]  [employer] [facility] Dept. 1) [address] [place] 2) [address], [place] 3)  [address], [place] [phone]  [phone]   [facility] Child Abuse Hotline [phone] or [phone] (After Hours)

## 2015-08-29 ENCOUNTER — Encounter (HOSPITAL_COMMUNITY): Payer: Self-pay

## 2015-08-29 ENCOUNTER — Emergency Department (HOSPITAL_COMMUNITY)
Admission: EM | Admit: 2015-08-29 | Discharge: 2015-08-30 | Disposition: A | Discharge status: Home / Self Care | Payer: Medicaid Other | Attending: Emergency Medicine | Admitting: Emergency Medicine

## 2015-08-29 DIAGNOSIS — F1010 Alcohol abuse, uncomplicated: Secondary | ICD-10-CM | POA: Insufficient documentation

## 2015-08-29 DIAGNOSIS — F17210 Nicotine dependence, cigarettes, uncomplicated: Secondary | ICD-10-CM | POA: Insufficient documentation

## 2015-08-29 DIAGNOSIS — J45909 Unspecified asthma, uncomplicated: Secondary | ICD-10-CM | POA: Insufficient documentation

## 2015-08-29 DIAGNOSIS — Z79899 Other long term (current) drug therapy: Secondary | ICD-10-CM | POA: Insufficient documentation

## 2015-08-29 DIAGNOSIS — Z880 Allergy status to penicillin: Secondary | ICD-10-CM | POA: Insufficient documentation

## 2015-08-29 DIAGNOSIS — K852 Alcohol induced acute pancreatitis without necrosis or infection: Secondary | ICD-10-CM | POA: Insufficient documentation

## 2015-08-29 DIAGNOSIS — M1990 Unspecified osteoarthritis, unspecified site: Secondary | ICD-10-CM | POA: Insufficient documentation

## 2015-08-29 LAB — CBC WITH DIFFERENTIAL/PLATELET
BASOS ABS: 0.1 10*3/uL (ref 0.0–0.1)
Basophils Relative: 1 %
Eosinophils Absolute: 0 10*3/uL (ref 0.0–0.7)
Eosinophils Relative: 0 %
HEMATOCRIT: 39.9 % (ref 39.0–52.0)
Hemoglobin: 13.1 g/dL (ref 13.0–17.0)
LYMPHS ABS: 1.1 10*3/uL (ref 0.7–4.0)
LYMPHS PCT: 12 %
MCH: 24.6 pg — AB (ref 26.0–34.0)
MCHC: 32.8 g/dL (ref 30.0–36.0)
MCV: 74.9 fL — AB (ref 78.0–100.0)
MONO ABS: 0.7 10*3/uL (ref 0.1–1.0)
MONOS PCT: 7 %
NEUTROS ABS: 7.9 10*3/uL — AB (ref 1.7–7.7)
Neutrophils Relative %: 80 %
Platelets: 192 10*3/uL (ref 150–400)
RBC: 5.33 MIL/uL (ref 4.22–5.81)
RDW: 15.4 % (ref 11.5–15.5)
WBC: 9.8 10*3/uL (ref 4.0–10.5)

## 2015-08-29 LAB — LIPASE, BLOOD: Lipase: 84 U/L — ABNORMAL HIGH (ref 11–51)

## 2015-08-29 LAB — COMPREHENSIVE METABOLIC PANEL
ALT: 35 U/L (ref 17–63)
ANION GAP: 17 — AB (ref 5–15)
AST: 86 U/L — AB (ref 15–41)
Albumin: 4.5 g/dL (ref 3.5–5.0)
Alkaline Phosphatase: 58 U/L (ref 38–126)
BUN: 13 mg/dL (ref 6–20)
CHLORIDE: 91 mmol/L — AB (ref 101–111)
CO2: 25 mmol/L (ref 22–32)
Calcium: 10.3 mg/dL (ref 8.9–10.3)
Creatinine, Ser: 1.11 mg/dL (ref 0.61–1.24)
GFR calc Af Amer: >60 mL/min (ref >60)
GFR calc non Af Amer: >60 mL/min (ref >60)
GLUCOSE: 94 mg/dL (ref 65–99)
POTASSIUM: 5.7 mmol/L — AB (ref 3.5–5.1)
Sodium: 133 mmol/L — ABNORMAL LOW (ref 135–145)
Total Bilirubin: 2 mg/dL — ABNORMAL HIGH (ref 0.3–1.2)
Total Protein: 8.2 g/dL — ABNORMAL HIGH (ref 6.5–8.1)

## 2015-08-29 MED ORDER — SODIUM CHLORIDE 0.9 % IV BOLUS (SEPSIS)
1000.0000 mL | Freq: Once | INTRAVENOUS | Status: AC
  Administered 2015-08-29: 1000 mL via INTRAVENOUS

## 2015-08-29 MED ORDER — HYDROMORPHONE HCL 1 MG/ML IJ SOLN
1.0000 mg | Freq: Once | INTRAMUSCULAR | Status: AC
  Administered 2015-08-30: 1 mg via INTRAVENOUS
  Filled 2015-08-29: qty 1

## 2015-08-29 NOTE — ED Provider Notes (Signed)
CSN: [id number]     Arrival date & time 08/29/15  2234 History   First MD Initiated Contact with Patient 08/29/15 2240     Chief Complaint  Patient presents with  . Pancreatitis     (Consider location/radiation/quality/duration/timing/severity/associated sxs/prior Treatment) HPI   Patient has a PMH of asthma, Vertigo, alcohol-induced pancreatitis, arthritis, hx of replantated thumb comes to the ER with complaints of epigastric pain. He believes this to be a pancreatitis flair. He reports drinking alcohol this past Sunday and eating a large greasy burger and fries on Sunday and again on Monday. He has been having pain and throwing up since 4/26. He knows that alcohol and greasy food causes pancreatitis but he states he had it anyways. He has not had fevers, confusion, diaphoresis,  headache, weakness (general or focal), change of vision,  neck pain, dysphagia, aphagia, chest pain, shortness of breath,  back pain, diarrhea, lower extremity swelling, rash.  Riley Kim is a 40 y.o. male  PCP: Riley Snare, MD   Past Medical History  Diagnosis Date  . Asthma   . Vertigo   . 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 Arthritis     "right foot, I broke it before"   Past Surgical History  Procedure Laterality Date  . Replantation thumb Right 2000   History reviewed. No pertinent family history. Social History  Substance Use Topics  . Smoking status: Current Every Day Smoker -- 0.00 packs/day for 0 years    Types: Cigarettes  . Smokeless tobacco: Never Used     Comment: cutting back  . Alcohol Use: Yes     Comment: Drink daily.    Review of Systems  Review of Systems All other systems negative except as documented in the HPI. All pertinent positives and negatives as reviewed in the HPI.   Allergies  Penicillins and Lisinopril  Home Medications   Prior to Admission medications   Medication Sig Start Date End Date Taking?  Authorizing Provider  albuterol (PROVENTIL HFA;VENTOLIN HFA) 108 (90 BASE) MCG/ACT inhaler Inhale 2 puffs into the lungs every 6 (six) hours as needed for wheezing or shortness of breath. 07/26/14  Yes Riley Lucrezia Starch, MD  Chlorphen-Pseudoephed-APAP (THERAFLU FLU/COLD PO) Take 1 tablet by mouth every 6 (six) hours as needed (cold/flu symptoms).   Yes Historical Provider, MD  ibuprofen (ADVIL,MOTRIN) 200 MG tablet Take 200 mg by mouth every 6 (six) hours as needed for moderate pain.   Yes Historical Provider, MD  HYDROcodone-acetaminophen (NORCO/VICODIN) 5-325 MG tablet Take 1-2 tablets by mouth every 4 (four) hours as needed. 08/30/15   Riley Thoma Neva Seat, PA-C  ondansetron (ZOFRAN) 4 MG tablet Take 1 tablet (4 mg total) by mouth every 6 (six) hours. 08/30/15   Riley Spicer Neva Seat, PA-C   BP 122/79 mmHg  Pulse 85  Temp(Src) 98.3 F (36.8 C) (Oral)  Resp 15  Ht  (1.88 m)  Wt 99.791 kg  BMI 28.23 kg/m2  SpO2 95% Physical Exam  Constitutional: He appears well-developed and well-nourished. No distress.  HENT:  Head: Normocephalic and atraumatic.  Right Ear: Tympanic membrane and ear canal normal.  Left Ear: Tympanic membrane and ear canal normal.  Nose: Nose normal.  Mouth/Throat: Uvula is midline, oropharynx is clear and moist and mucous membranes are normal.  Eyes: Pupils are equal, round, and reactive to light.  Neck: Normal range of motion. Neck supple.  Cardiovascular: Normal rate and regular rhythm.   Pulmonary/Chest:  Effort normal.  Abdominal: Soft. Bowel sounds are normal. There is no hepatosplenomegaly. There is tenderness (diffuse tenderness to abdomen). There is no rigidity, no rebound, no guarding and no CVA tenderness.  No signs of abdominal distention. Abd is soft  Musculoskeletal:  No LE swelling  Neurological: He is alert.  Skin: Skin is warm and dry. No rash noted.  Nursing note and vitals reviewed.   ED Course  Procedures (including critical care time) Labs Review Labs  Reviewed  COMPREHENSIVE METABOLIC PANEL - Abnormal; Notable for the following:    Sodium 133 (*)    Potassium 5.7 (*)    Chloride 91 (*)    Total Protein 8.2 (*)    AST 86 (*)    Total Bilirubin 2.0 (*)    Anion gap 17 (*)    All other components within normal limits  CBC WITH DIFFERENTIAL/PLATELET - Abnormal; Notable for the following:    MCV 74.9 (*)    MCH 24.6 (*)    Neutro Abs 7.9 (*)    All other components within normal limits  LIPASE, BLOOD - Abnormal; Notable for the following:    Lipase 84 (*)    All other components within normal limits  POTASSIUM    Imaging Review No results found. I have personally reviewed and evaluated these images and lab results as part of my medical decision-making.   EKG Interpretation   Date/Time:  Thursday August 30 2015 00:10:31 EDT Ventricular Rate:  94 PR Interval:  128 QRS Duration: 80 QT Interval:  368 QTC Calculation: 460 R Axis:   65 Text Interpretation:  Sinus rhythm Baseline wander in lead(s) III No  significant change since last tracing Confirmed by BEATON  MD, Riley  ([id number]) on 08/30/2015 12:13:01 AM      MDM   Final diagnoses:  Alcohol induced acute pancreatitis    The patient's potassium initially came up as 5.7, but it was pulled off the line and when rechecked is 4.3 which is normal. Otherwise his lipase is 84 not significantly elevated. He is given a dose of pain medicines in the emergency department as well as nausea medicine which improved his symptoms significantly. He has been given education about not drinking alcohol to help prevent alcohol induced pancreatitis which she has had before and is familiar with. Begin the nausea and pain medicine for home. I recommend he follow-up with his primary care doctor or his GI.  Medications  sodium chloride 0.9 % bolus 1,000 mL (0 mLs Intravenous Stopped 08/30/15 0013)  HYDROmorphone (DILAUDID) injection 1 mg (1 mg Intravenous Given 08/30/15 0013)    I discussed  results, diagnoses and plan with Riley Kim. They voice there understanding and questions were answered. We discussed follow-up recommendations and return precautions.    Riley Peliffany Wyndell Cardiff, PA-C 08/30/15 0141  Riley MawKristen N Ward, DO 08/30/15 [id number]

## 2015-08-29 NOTE — ED Notes (Signed)
Pt from home with what he believes to be a flare up of pancreatitis . Pt states drinking some alcohol Sunday night and has been throwing since

## 2015-08-30 LAB — POTASSIUM: Potassium: 4.3 mmol/L (ref 3.5–5.1)

## 2015-08-30 MED ORDER — HYDROCODONE-ACETAMINOPHEN 5-325 MG PO TABS: 1.0000 | ORAL_TABLET | ORAL | Status: DC | PRN

## 2015-08-30 MED ORDER — ONDANSETRON HCL 4 MG PO TABS: 4.0000 mg | ORAL_TABLET | Freq: Four times a day (QID) | ORAL | Status: DC

## 2015-08-30 MED ORDER — OXYCODONE-ACETAMINOPHEN 5-325 MG PO TABS
1.0000 | ORAL_TABLET | Freq: Once | ORAL | Status: AC
Start: 2015-08-30 — End: 2015-08-30
  Administered 2015-08-30: 1 via ORAL
  Filled 2015-08-30: qty 1

## 2015-08-30 NOTE — ED Notes (Signed)
Pt given water for fluid challenge 

## 2015-08-30 NOTE — Discharge Instructions (Signed)

## 2015-09-03 ENCOUNTER — Emergency Department (HOSPITAL_COMMUNITY)
Admission: AD | Admit: 2015-09-03 | Discharge: 2015-09-03 | Disposition: A | Discharge status: Home / Self Care | Payer: Self-pay | Source: Ambulatory Visit | Attending: Emergency Medicine | Admitting: Emergency Medicine

## 2015-09-03 ENCOUNTER — Emergency Department (HOSPITAL_COMMUNITY): Payer: Self-pay

## 2015-09-03 ENCOUNTER — Encounter (HOSPITAL_COMMUNITY): Payer: Self-pay | Admitting: *Deleted

## 2015-09-03 DIAGNOSIS — Z791 Long term (current) use of non-steroidal anti-inflammatories (NSAID): Secondary | ICD-10-CM | POA: Insufficient documentation

## 2015-09-03 DIAGNOSIS — J45909 Unspecified asthma, uncomplicated: Secondary | ICD-10-CM | POA: Insufficient documentation

## 2015-09-03 DIAGNOSIS — F17210 Nicotine dependence, cigarettes, uncomplicated: Secondary | ICD-10-CM | POA: Insufficient documentation

## 2015-09-03 DIAGNOSIS — S52592A Other fractures of lower end of left radius, initial encounter for closed fracture: Secondary | ICD-10-CM

## 2015-09-03 DIAGNOSIS — M1990 Unspecified osteoarthritis, unspecified site: Secondary | ICD-10-CM | POA: Insufficient documentation

## 2015-09-03 DIAGNOSIS — W010XXA Fall on same level from slipping, tripping and stumbling without subsequent striking against object, initial encounter: Secondary | ICD-10-CM

## 2015-09-03 DIAGNOSIS — S52502A Unspecified fracture of the lower end of left radius, initial encounter for closed fracture: Secondary | ICD-10-CM

## 2015-09-03 DIAGNOSIS — M25532 Pain in left wrist: Secondary | ICD-10-CM | POA: Insufficient documentation

## 2015-09-03 DIAGNOSIS — Y92511 Restaurant or cafe as the place of occurrence of the external cause: Secondary | ICD-10-CM

## 2015-09-03 DIAGNOSIS — Y998 Other external cause status: Secondary | ICD-10-CM

## 2015-09-03 DIAGNOSIS — Z79899 Other long term (current) drug therapy: Secondary | ICD-10-CM | POA: Insufficient documentation

## 2015-09-03 DIAGNOSIS — W19XXXA Unspecified fall, initial encounter: Secondary | ICD-10-CM

## 2015-09-03 DIAGNOSIS — Z7951 Long term (current) use of inhaled steroids: Secondary | ICD-10-CM | POA: Insufficient documentation

## 2015-09-03 MED ORDER — OXYCODONE-ACETAMINOPHEN 5-325 MG PO TABS: 2.0000 | ORAL_TABLET | ORAL | Status: DC | PRN

## 2015-09-03 MED ORDER — OXYCODONE-ACETAMINOPHEN 5-325 MG PO TABS
1.0000 | ORAL_TABLET | Freq: Once | ORAL | Status: AC
  Administered 2015-09-03: 1 via ORAL
  Filled 2015-09-03: qty 1

## 2015-09-03 NOTE — Discharge Instructions (Signed)
Radial Fracture  A radial fracture is a break in the radius bone, which is the long bone of the forearm that is on the same side as your thumb. Your forearm is the part of your arm that is between your elbow and your wrist. It is made up of two bones: the radius and the ulna.  Most radial fractures occur near the wrist (distal radialfracture) or near the elbow (radial head fracture). A distal radial fracture is the most common type of broken arm. This fracture usually occurs about an inch above the wrist. Fractures of the middle part of the bone are less common.  CAUSES   Falling with your arm outstretched is the most common cause of a radial fracture. Other causes include:   Car accidents.   Bike accidents.   A direct blow to the middle part of the radius.  RISK FACTORS   You may be at greater risk for a distal radial fracture if you are 60 years of age or older.   You may be at greater risk for a radial head fracture if you are:    Male.    30-40 years old.   You may be at a greater risk for all types of radial fractures if you have a condition that causes your bones to be weak or thin (osteoporosis).  SIGNS AND SYMPTOMS  A radial fracture causes pain immediately after the injury. Other signs and symptoms include:   An abnormal bend or bump in your arm (deformity).   Swelling.   Bruising.   Numbness or tingling.   Tenderness.   Limited movement.  DIAGNOSIS   Your health care provider may diagnose a radial fracture based on:   Your symptoms.   Your medical history, including any recent injury.   A physical exam. Your health care provider will look for any deformity and feel for tenderness over the break. Your health care provider will also check whether the bone is out of place.   An X-ray exam to confirm the diagnosis and learn more about the type of fracture.  TREATMENT  The goals of treatment are to get the bone in proper position for healing and to keep it from moving so it will heal over  time. Your treatment will depend on many factors, especially the type of fracture that you have.   If the fractured bone:    Is in the correct position (nondisplaced), you may only need to wear a cast or a splint.    Has a slightly displaced fracture, you may need to have the bones moved back into place manually (closed reduction) before the splint or cast is put on.   You may have a temporary splint before you have a plaster cast. The splint allows room for some swelling. After a few days, a cast can replace the splint.    You may have to wear the cast for about 6 weeks or as directed by your health care provider.    The cast may be changed after about 3 weeks or as directed by your health care provider.   After your cast is taken off, you may need physical therapy to regain full movement in your wrist or elbow.   You may need emergency surgery if you have:    A fractured bone that is out of position (displaced).    A fracture with multiple fragments (comminuted fracture).    A fracture that breaks the skin (open fracture). This type of   This helps to reduce swelling and pain.  Apply ice to the injured area:  Put ice in a plastic bag.  Place a towel between your skin and the bag.  Leave the ice on for 20 minutes, 2-3 times per day.  Move your fingers often to avoid stiffness and to minimize swelling.  If you have a plaster or fiberglass cast:  Do not try to scratch the skin under the cast using sharp or pointed objects.  Check the skin around the cast every day. You may put lotion on any red or sore areas.  Keep your cast dry and clean.  If you have a plaster splint:  Wear the splint as directed.  Loosen the elastic around  the splint if your fingers become numb and tingle, or if they turn cold and blue.  Do not put pressure on any part of your cast until it is fully hardened. Rest your cast only on a pillow for the first 24 hours.  Protect your cast or splint while bathing or showering, as directed by your health care provider. Do not put your cast or splint into water.  Take medicines only as directed by your health care provider.  Return to activities, such as sports, as directed by your health care provider. Ask your health care provider what activities are safe for you.  Keep all follow-up visits as directed by your health care provider. This is important. SEEK MEDICAL CARE IF:  Your pain medicine is not helping.  Your cast gets damaged or it breaks.  Your cast becomes loose.  Your cast gets wet.  You have more severe pain or swelling than you did before the cast.  You have severe pain when stretching your fingers.  You continue to have pain or stiffness in your elbow or your wrist after your cast is taken off. SEEK IMMEDIATE MEDICAL CARE IF:  You cannot move your fingers.  You lose feeling in your fingers or your hand.  Your hand or your fingers turn cold and pale or blue.  You notice a bad smell coming from your cast.  You have drainage from underneath your cast.  You have new stains from blood or drainage seeping through your cast.   This information is not intended to replace advice given to you by your health care provider. Make sure you discuss any questions you have with your health care provider.   Follow-up with hand orthopedic provider for reevaluation as soon as possible. Keep arm and splint. Apply ice to affected area. Keep arm elevated as much as possible. Take pain medication and ibuprofen at home for pain. Return to the emergency department if you experience severe worsening of your symptoms, increased redness and swelling around your wrist, numbness or tingling in your  hand, fevers, chills, chest pain or shortness of breath.

## 2015-09-03 NOTE — ED Notes (Signed)
Per [facility]. Patient fell at work on eggs and is now c/o left wrist pain.

## 2015-09-03 NOTE — ED Provider Notes (Signed)
CSN: [id number]     Arrival date & time 09/03/15  1235 History  By signing my name below, I, Riley Kim, attest that this documentation has been prepared under the direction and in the presence of Riley Kim [employer].  Electronically Signed: Iona Kim, ED Scribe 09/03/2015 at 2:28 PM.  Chief Complaint  Patient presents with  . Wrist Pain  . Fall    The history is provided by the patient. No language interpreter was used.   HPI Comments: Riley Kim is a 40 y.o. male with no pertinent PMHx who presents to the Emergency Department complaining of sudden onset, left wrist pain s/p fall today in which he slipped at work, fell backwards and landed on his left hand. Pt reports associated lower back pain. No other associated symptoms noted. Wrist pain is worsened with flexion and extension. No other worsening or alleviating factors noted. Pt denies numbness, tingling, weakness, or any other pertinent symptoms.   Past Medical History  Diagnosis Date  . Asthma   . Vertigo   . 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 Arthritis     "right foot, I broke it before"   Past Surgical History  Procedure Laterality Date  . Replantation thumb Right 2000   History reviewed. No pertinent family history. Social History  Substance Use Topics  . Smoking status: Current Every Day Smoker -- 0.00 packs/day for 0 years    Types: Cigarettes  . Smokeless tobacco: Never Used     Comment: cutting back  . Alcohol Use: Yes     Comment: Drink daily.    Review of Systems  Musculoskeletal: Positive for back pain and arthralgias.       Left wrist pain  Neurological: Negative for weakness and numbness.  All other systems reviewed and are negative.    Allergies  Penicillins and Lisinopril  Home Medications   Prior to Admission medications   Medication Sig Start Date End Date Taking? Authorizing Provider  albuterol (PROVENTIL HFA;VENTOLIN  HFA) 108 (90 BASE) MCG/ACT inhaler Inhale 2 puffs into the lungs every 6 (six) hours as needed for wheezing or shortness of breath. 07/26/14   Alexa Lucrezia Starch, MD  Chlorphen-Pseudoephed-APAP (THERAFLU FLU/COLD PO) Take 1 tablet by mouth every 6 (six) hours as needed (cold/flu symptoms).    Historical Provider, MD  HYDROcodone-acetaminophen (NORCO/VICODIN) 5-325 MG tablet Take 1-2 tablets by mouth every 4 (four) hours as needed. 08/30/15   Tiffany Neva Seat, PA-C  ibuprofen (ADVIL,MOTRIN) 200 MG tablet Take 200 mg by mouth every 6 (six) hours as needed for moderate pain.    Historical Provider, MD  ondansetron (ZOFRAN) 4 MG tablet Take 1 tablet (4 mg total) by mouth every 6 (six) hours. 08/30/15   Tiffany Neva Seat, PA-C   BP 101/66 mmHg  Pulse 83  Temp(Src) 97.6 F (36.4 C) (Oral)  Resp 16  SpO2 97% Physical Exam  Constitutional: He is oriented to person, place, and time. He appears well-developed and well-nourished. No distress.  HENT:  Head: Normocephalic and atraumatic.  Eyes: Conjunctivae are normal. Right eye exhibits no discharge. Left eye exhibits no discharge. No scleral icterus.  Cardiovascular: Normal rate.   Pulmonary/Chest: Effort normal.  Musculoskeletal: Normal range of motion. He exhibits tenderness.  Significant TTP over the distal radial head. Pain with flexion and extension of wrist. No obvious wound or deformity. No decreased ROM of fingers. Intact distal pulses.   Midline spinal TTP of lumbar spine. Negative  straight leg raise. Full ROM of cervical, thoracic, and lumbar spine.  Neurological: He is alert and oriented to person, place, and time. Coordination normal.  Skin: Skin is warm and dry. No rash noted. He is not diaphoretic. No erythema. No pallor.  Psychiatric: He has a normal mood and affect. His behavior is normal.  Nursing note and vitals reviewed.   ED Course  Procedures (including critical care time) DIAGNOSTIC STUDIES: Oxygen Saturation is 97% on RA, normal by my  interpretation.    COORDINATION OF CARE: 1:13 PM Discussed treatment plan which includes Dg lumbar spine complete, DG wrist complete left, and percocet/roxicet with pt at bedside and pt agreed to plan.  Labs Review Labs Reviewed - No data to display  Imaging Review Dg Lumbar Spine Complete  09/03/2015  CLINICAL DATA:  Pain following fall EXAM: LUMBAR SPINE - COMPLETE 4+ VIEW COMPARISON:  None. FINDINGS: Frontal, lateral, spot lumbosacral lateral, and bilateral oblique views were obtained. There are 5 non-rib-bearing lumbar type vertebral bodies. There is no fracture or spondylolisthesis. The disc spaces appear normal. There is no appreciable facet arthropathy. IMPRESSION: No fracture or spondylolisthesis.  No appreciable arthropathy. Electronically Signed   By: Bretta BangWilliam  Woodruff III M.D.   On: 09/03/2015 14:16   Dg Wrist Complete Left  09/03/2015  CLINICAL DATA:  Pain following fall onto outstretched hand EXAM: LEFT WRIST - COMPLETE 3+ VIEW COMPARISON:  None. FINDINGS: Frontal, oblique, lateral, and ulnar deviation scaphoid images were obtained. There is a fracture of the distal radius extending from the metaphysis -diaphysis junction to the joint surface along the more medial aspect. Alignment is essentially anatomic. There is no other demonstrable fracture. No dislocation. There is cystic change in the distal scaphoid which appears benign. There is no appreciable joint space narrowing. IMPRESSION: Nondisplaced fracture distal radius. No other fractures. No dislocation. Benign cystic change in the distal scaphoid. No appreciable joint space narrowing. Electronically Signed   By: Bretta BangWilliam  Woodruff III M.D.   On: 09/03/2015 14:15   I have personally reviewed and evaluated these images as part of my medical decision-making.   EKG Interpretation None      MDM   Final diagnoses:  Fall, initial encounter  Wrist pain, acute, left   X-ray wrist reveals nondisplaced fracture of distal radius. No  abnormalities on lumbar spine x-ray. Patient ambulatory in ED without difficulty. No neuro deficits. Patient placed in a short arm sugar tong splint while in the ED. Pain managed in the ED as well. He'll be discharged home with orthopedic follow-up, referral given. Rice precautions and pain medication indicated and discussed. Return precautions outlined in patient discharge instructions.  I personally performed the services described in this documentation, which was scribed in my presence. The recorded information has been reviewed and is accurate.       Lester KinsmanSamantha Kim [place], PA-C 09/03/15 1453  Richardean Canalavid H Yao, MD 09/03/15 1500

## 2015-09-03 NOTE — MAU Note (Signed)
States he was walking at work and fell on some egg shells. Thinks he broke his L wrist. No swelling noted. Ice pack applied.

## 2015-09-03 NOTE — MAU Note (Signed)
To [facility] via [facility]

## 2015-09-03 NOTE — MAU Note (Signed)
Moving about constantly, repeating himself. Difficult to assess. Blanche EastJ. Rasch, NP in triage to assess pt.

## 2015-09-03 NOTE — ED Notes (Signed)
RT called

## 2015-09-03 NOTE — MAU Provider Note (Signed)
S:  Riley Kim is a 40 y.o. male presenting to MAU with concerns about pain in his left wrist/left arm.  20 minutes prior to coming into MAU the patient reports that he slipped at his work site. When he fell he tried putting all his weight on his left arm/ hand. When he bends his arm at his elbow he feels shooting/sharp pain in his left arm.   Last night he drank alcohol. " I feel hungover this morning" "Im not trying to sue [employer] however I just want to find out what is wrong with my hand".   He denies alcohol use this morning    O:  GENERAL: Well-developed, well-nourished male in some distress. Patient appropriate however slurring his speech  LUNGS: Effort normal SKIN: Warm, dry and without erythema MUSCULOSKELETAL: Pain with flexion and extension of left wrist and left fingers. + decrease ROM of left fingers.   Filed Vitals:   09/03/15 1100  BP: 134/84  Pulse: 95  Temp: 98.1 F (36.7 C)  Resp: 22   MDM:  Discussed plan of care with Dr. Molli KnockStinson [facility] ED physician contacted, will make arrangements for [facility]. Dr. Particia NearingHaviland is the accepting physician.    A:  Fall, initial encounter  Wrist pain, acute, left  Distal radius fracture, left, closed, initial encounter   P:  [facility] for [facility]   Duane LopeJennifer I Davy [place]moreland, NP 09/03/2015 4:59 PM

## 2015-09-03 NOTE — ED Notes (Signed)
Off floor for testing 

## 2015-09-03 NOTE — ED Notes (Signed)
Bed: [facility] Expected date:  Expected time:  Means of arrival:  Comments[facility]

## 2015-09-24 ENCOUNTER — Emergency Department (HOSPITAL_COMMUNITY): Payer: Self-pay

## 2015-09-24 ENCOUNTER — Emergency Department (HOSPITAL_COMMUNITY)
Admission: EM | Admit: 2015-09-24 | Discharge: 2015-09-25 | Disposition: A | Discharge status: Home / Self Care | Payer: Self-pay | Attending: Emergency Medicine | Admitting: Emergency Medicine

## 2015-09-24 DIAGNOSIS — R17 Unspecified jaundice: Secondary | ICD-10-CM | POA: Insufficient documentation

## 2015-09-24 DIAGNOSIS — Z79899 Other long term (current) drug therapy: Secondary | ICD-10-CM | POA: Insufficient documentation

## 2015-09-24 DIAGNOSIS — R1010 Upper abdominal pain, unspecified: Secondary | ICD-10-CM | POA: Insufficient documentation

## 2015-09-24 DIAGNOSIS — R7401 Elevation of levels of liver transaminase levels: Secondary | ICD-10-CM

## 2015-09-24 DIAGNOSIS — K860 Alcohol-induced chronic pancreatitis: Secondary | ICD-10-CM | POA: Insufficient documentation

## 2015-09-24 DIAGNOSIS — F1010 Alcohol abuse, uncomplicated: Secondary | ICD-10-CM | POA: Insufficient documentation

## 2015-09-24 DIAGNOSIS — R740 Nonspecific elevation of levels of transaminase and lactic acid dehydrogenase [LDH]: Secondary | ICD-10-CM | POA: Insufficient documentation

## 2015-09-24 DIAGNOSIS — M19071 Primary osteoarthritis, right ankle and foot: Secondary | ICD-10-CM | POA: Insufficient documentation

## 2015-09-24 DIAGNOSIS — R112 Nausea with vomiting, unspecified: Secondary | ICD-10-CM | POA: Insufficient documentation

## 2015-09-24 DIAGNOSIS — J45909 Unspecified asthma, uncomplicated: Secondary | ICD-10-CM | POA: Insufficient documentation

## 2015-09-24 DIAGNOSIS — I10 Essential (primary) hypertension: Secondary | ICD-10-CM | POA: Insufficient documentation

## 2015-09-24 DIAGNOSIS — F17210 Nicotine dependence, cigarettes, uncomplicated: Secondary | ICD-10-CM | POA: Insufficient documentation

## 2015-09-24 LAB — COMPREHENSIVE METABOLIC PANEL
ALBUMIN: 4.7 g/dL (ref 3.5–5.0)
ALK PHOS: 87 U/L (ref 38–126)
ALT: 61 U/L (ref 17–63)
AST: 134 U/L — AB (ref 15–41)
Anion gap: 12 (ref 5–15)
BILIRUBIN TOTAL: 1.5 mg/dL — AB (ref 0.3–1.2)
BUN: 9 mg/dL (ref 6–20)
CO2: 23 mmol/L (ref 22–32)
Calcium: 10 mg/dL (ref 8.9–10.3)
Chloride: 97 mmol/L — ABNORMAL LOW (ref 101–111)
Creatinine, Ser: 0.72 mg/dL (ref 0.61–1.24)
GFR calc Af Amer: >60 mL/min (ref >60)
GFR calc non Af Amer: >60 mL/min (ref >60)
GLUCOSE: 94 mg/dL (ref 65–99)
POTASSIUM: 3.9 mmol/L (ref 3.5–5.1)
SODIUM: 132 mmol/L — AB (ref 135–145)
TOTAL PROTEIN: 8.5 g/dL — AB (ref 6.5–8.1)

## 2015-09-24 LAB — URINALYSIS, ROUTINE W REFLEX MICROSCOPIC
GLUCOSE, UA: NEGATIVE mg/dL
HGB URINE DIPSTICK: NEGATIVE
KETONES UR: 40 mg/dL — AB
NITRITE: POSITIVE — AB
PH: 5.5 (ref 5.0–8.0)
Protein, ur: 30 mg/dL — AB
Specific Gravity, Urine: 1.04 — ABNORMAL HIGH (ref 1.005–1.030)

## 2015-09-24 LAB — CBC
HEMATOCRIT: 39.2 % (ref 39.0–52.0)
Hemoglobin: 13 g/dL (ref 13.0–17.0)
MCH: 24.5 pg — AB (ref 26.0–34.0)
MCHC: 33.2 g/dL (ref 30.0–36.0)
MCV: 74 fL — ABNORMAL LOW (ref 78.0–100.0)
Platelets: 156 10*3/uL (ref 150–400)
RBC: 5.3 MIL/uL (ref 4.22–5.81)
RDW: 14.7 % (ref 11.5–15.5)
WBC: 7.3 10*3/uL (ref 4.0–10.5)

## 2015-09-24 LAB — URINE MICROSCOPIC-ADD ON

## 2015-09-24 LAB — LIPASE, BLOOD: Lipase: 111 U/L — ABNORMAL HIGH (ref 11–51)

## 2015-09-24 MED ORDER — HYDROMORPHONE HCL 1 MG/ML IJ SOLN
1.0000 mg | Freq: Once | INTRAMUSCULAR | Status: AC
  Administered 2015-09-24: 1 mg via INTRAVENOUS
  Filled 2015-09-24: qty 1

## 2015-09-24 MED ORDER — METOCLOPRAMIDE HCL 5 MG/ML IJ SOLN
10.0000 mg | Freq: Once | INTRAMUSCULAR | Status: AC
  Administered 2015-09-24: 10 mg via INTRAVENOUS
  Filled 2015-09-24: qty 2

## 2015-09-24 MED ORDER — SODIUM CHLORIDE 0.9 % IV BOLUS (SEPSIS)
1000.0000 mL | Freq: Once | INTRAVENOUS | Status: AC
  Administered 2015-09-24: 1000 mL via INTRAVENOUS

## 2015-09-24 MED ORDER — ONDANSETRON 4 MG PO TBDP
4.0000 mg | ORAL_TABLET | Freq: Once | ORAL | Status: AC | PRN
  Administered 2015-09-24: 4 mg via ORAL
  Filled 2015-09-24: qty 1

## 2015-09-24 MED ORDER — OXYCODONE-ACETAMINOPHEN 5-325 MG PO TABS
1.0000 | ORAL_TABLET | ORAL | Status: DC | PRN
  Administered 2015-09-24: 1 via ORAL
  Filled 2015-09-24: qty 1

## 2015-09-24 NOTE — ED Notes (Signed)
Writer made pt aware that urine is needed for sample, urinal at bedside. 

## 2015-09-24 NOTE — ED Notes (Signed)
Per EMS patient comes from home for RUQ pain. Patient has PMH pancreatitis and drank a 16oz beer earlier trying to help with pain.  No v/d.

## 2015-09-24 NOTE — ED Provider Notes (Signed)
CSN: [id number]     Arrival date & time 09/24/15  1755 History   First MD Initiated Contact with Patient 09/24/15 2234     Chief Complaint  Patient presents with  . Abdominal Pain     (Consider location/radiation/quality/duration/timing/severity/associated sxs/prior Treatment) HPI Comments: Riley Kim is a 40 y.o. male with a PMHx of asthma, alcohol induced pancreatitis, and arthritis, who presents to the ED with complaints of gradual onset upper abdominal pain 2 days that began after he ate spaghetti with red sauce, cheeseburger, and fries. He states that this feels similar to when he has had pancreatitis in the past. He describes his pain as 10/10 constant sharp upper abdominal pain radiating into his back, worse with eating or drinking, and unrelieved with Advil and Percocet given in triage. Associated symptoms include nausea and 2 episodes of nonbloody nonbilious emesis. He reports that anytime he eats anything with red sauce it seems to trigger similar symptoms. He attempted to drink one 16 ounce beer this morning to help with the pain but states this made it worse. He denies any alcohol use prior to onset of symptoms. States zofran in triage initially helped until he had to drink water with his percocet and then his nausea returned.  He denies any fevers, chills, chest pain, shortness breath, diarrhea, constipation, melena, hematochezia, hematemesis, obstipation, dysuria, hematuria, numbness, tingling, weakness, recent travel, sick contacts, suspicious food intake, or chronic NSAID use.  Chart review reveals that he had a RUQ U/S in 10/2014 that showed biliary sludge. He has required admission for his pancreatitis in the past, but when it's "not that bad" he can get pain control, nausea control, and has managed his symptoms at home; most recently last month he was able to do this.   Patient is a 40 y.o. male presenting with abdominal pain. The history is provided by the patient and  medical records. No language interpreter was used.  Abdominal Pain Pain location:  LUQ, RUQ and epigastric Pain quality: sharp   Pain radiates to:  Back Pain severity:  Severe Onset quality:  Gradual Duration:  2 days Timing:  Constant Progression:  Unchanged Chronicity:  Recurrent Context: alcohol use and eating   Context: not recent travel, not sick contacts and not suspicious food intake   Relieved by:  Nothing Worsened by:  Eating Ineffective treatments:  NSAIDs (and percocet in triage) Associated symptoms: nausea and vomiting   Associated symptoms: no chest pain, no chills, no constipation, no diarrhea, no dysuria, no fever, no flatus, no hematemesis, no hematochezia, no hematuria, no melena and no shortness of breath   Risk factors: alcohol abuse   Risk factors: has not had multiple surgeries and no NSAID use     Past Medical History  Diagnosis Date  . Asthma   . Vertigo   . 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 Arthritis     "right foot, I broke it before"   Past Surgical History  Procedure Laterality Date  . Replantation thumb Right 2000   No family history on file. Social History  Substance Use Topics  . Smoking status: Current Every Day Smoker -- 0.00 packs/day for 0 years    Types: Cigarettes  . Smokeless tobacco: Never Used     Comment: cutting back  . Alcohol Use: Yes     Comment: Drink daily.    Review of Systems  Constitutional: Negative for fever and chills.  Respiratory: Negative for  shortness of breath.   Cardiovascular: Negative for chest pain.  Gastrointestinal: Positive for nausea, vomiting and abdominal pain. Negative for diarrhea, constipation, blood in stool, melena, hematochezia, flatus and hematemesis.  Genitourinary: Negative for dysuria and hematuria.  Musculoskeletal: Negative for myalgias and arthralgias.  Skin: Negative for color change.  Allergic/Immunologic: Negative for  immunocompromised state.  Neurological: Negative for weakness and numbness.  Psychiatric/Behavioral: Negative for confusion.   10 Systems reviewed and are negative for acute change except as noted in the HPI.    Allergies  Penicillins and Lisinopril  Home Medications   Prior to Admission medications   Medication Sig Start Date End Date Taking? Authorizing Provider  ibuprofen (ADVIL,MOTRIN) 200 MG tablet Take 200 mg by mouth every 6 (six) hours as needed for moderate pain.   Yes Historical Provider, MD  albuterol (PROVENTIL HFA;VENTOLIN HFA) 108 (90 BASE) MCG/ACT inhaler Inhale 2 puffs into the lungs every 6 (six) hours as needed for wheezing or shortness of breath. 07/26/14   Alexa Lucrezia Starch, MD  Chlorphen-Pseudoephed-APAP (THERAFLU FLU/COLD PO) Take 1 tablet by mouth every 6 (six) hours as needed (cold/flu symptoms).    Historical Provider, MD  HYDROcodone-acetaminophen (NORCO/VICODIN) 5-325 MG tablet Take 1-2 tablets by mouth every 4 (four) hours as needed. 08/30/15   Tiffany Neva Seat, PA-C  ondansetron (ZOFRAN) 4 MG tablet Take 1 tablet (4 mg total) by mouth every 6 (six) hours. 08/30/15   Marlon Pel, PA-C  oxyCODONE-acetaminophen (PERCOCET/ROXICET) 5-325 MG tablet Take 2 tablets by mouth every 4 (four) hours as needed for severe pain. 09/03/15   [name] Dowless, PA-C   BP 140/80 mmHg  Pulse 80  Temp(Src) 99.1 F (37.3 C) (Oral)  Resp 18  SpO2 99% Physical Exam  Constitutional: He is oriented to person, place, and time. Vital signs are normal. He appears well-developed and well-nourished.  Non-toxic appearance. No distress.  Afebrile, nontoxic, NAD  HENT:  Head: Normocephalic and atraumatic.  Mouth/Throat: Oropharynx is clear and moist and mucous membranes are normal.  Eyes: Conjunctivae and EOM are normal. Right eye exhibits no discharge. Left eye exhibits no discharge.  Neck: Normal range of motion. Neck supple.  Cardiovascular: Normal rate, regular rhythm, normal heart  sounds and intact distal pulses.  Exam reveals no gallop and no friction rub.   No murmur heard. Pulmonary/Chest: Effort normal and breath sounds normal. No respiratory distress. He has no decreased breath sounds. He has no wheezes. He has no rhonchi. He has no rales.  Abdominal: Soft. Normal appearance and bowel sounds are normal. He exhibits no distension. There is tenderness in the right upper quadrant, epigastric area and left upper quadrant. There is positive Murphy's sign (difficult to assess due to discomfort, but inspiration with palpation is painful). There is no rigidity, no rebound, no guarding, no CVA tenderness and no tenderness at McBurney's point.    Soft, nondistended, +BS throughout, with upper abdominal TTP diffusely across the upper abdomen, no r/g/r, murphy's exam difficult to perform due to pt discomfort but states that pain worsens with inspiration during palpation of the RUQ although he's still able to inspire fully, neg mcburney's, no CVA TTP   Musculoskeletal: Normal range of motion.  Neurological: He is alert and oriented to person, place, and time. He has normal strength. No sensory deficit.  Skin: Skin is warm, dry and intact. No rash noted.  Psychiatric: He has a normal mood and affect.  Nursing note and vitals reviewed.   ED Course  Procedures (including critical care time)  Labs Review Labs Reviewed  LIPASE, BLOOD - Abnormal; Notable for the following:    Lipase 111 (*)    All other components within normal limits  COMPREHENSIVE METABOLIC PANEL - Abnormal; Notable for the following:    Sodium 132 (*)    Chloride 97 (*)    Total Protein 8.5 (*)    AST 134 (*)    Total Bilirubin 1.5 (*)    All other components within normal limits  CBC - Abnormal; Notable for the following:    MCV 74.0 (*)    MCH 24.5 (*)    All other components within normal limits  URINALYSIS, ROUTINE W REFLEX MICROSCOPIC (NOT AT [facility]) - Abnormal; Notable for the following:    Color,  Urine ORANGE (*)    Specific Gravity, Urine 1.040 (*)    Bilirubin Urine MODERATE (*)    Ketones, ur 40 (*)    Protein, ur 30 (*)    Nitrite POSITIVE (*)    Leukocytes, UA SMALL (*)    All other components within normal limits  URINE MICROSCOPIC-ADD ON - Abnormal; Notable for the following:    Squamous Epithelial / LPF 0-5 (*)    Bacteria, UA FEW (*)    All other components within normal limits  URINE CULTURE  I-STAT TROPOININ, ED    Imaging Review No results found. I have personally reviewed and evaluated these images and lab results as part of my medical decision-making.   EKG Interpretation   Date/Time:  Monday Sep 24 2015 23:08:18 EDT Ventricular Rate:  74 PR Interval:  130 QRS Duration: 87 QT Interval:  410 QTC Calculation: 455 R Axis:   78 Text Interpretation:  Sinus rhythm No significant change since last  tracing Confirmed by [facility] MD, ERIN ([id number]) on 09/25/2015 12:28:06 AM      MDM   Final diagnoses:  Upper abdominal pain  Non-intractable vomiting with nausea, vomiting of unspecified type  Alcohol-induced chronic pancreatitis (HCC)  Elevated transaminase level  Elevated bilirubin  Essential hypertension  Alcohol abuse    40 y.o. male here with upper abd pain x2 days which he thinks is his pancreatitis. Some n/v as well. Tried to drink alcohol prior to arrival to help with it and it made it worse. States symptoms started after eating red sauce with spaghetti and cheeseburger/fries. Denies EtOH use prior to onset of symptoms. On exam, diffusely tender in upper abdomen, with RUQ tenderness worse with inspiration but difficult to obtain good murphy's exam due to pt discomfort, no r/g/r. Lipase 111, so could be mild pancreatitis, but it seems that he's had low-level elevations before and has been managed well outpatient. CMP with mildly low Na 132 similar to prior; AST 134 slightly higher than prior but similar to his last labs both likely from EtOH use; bili  1.5 which is lower than in prior evaluations. CBC WNL. Will await U/A and add on trop/EKG to ensure this isn't cardiac. Given zofran and percocet without significant ongoing relief. Will give reglan and dilaudid, and fluids. Will obtain Abd U/S to ensure this isn't gallbladder since he's had an U/S that showed gallbladder sludge in the past. Will reassess shortly   1:00 AM  U/A with moderate bili, ketones and protein, +nitrite, 0-5 squamous, 6-30 WBC, few bacteria with mucous present-- this is odd since he has no urinary symptoms for UTI, chart review reveals he's had +nitrites in urine on 10/09/14 but culture reveals no growth, could be false positive from the ?bilirubin vs  contamination? Unclear but without symptoms of UTI, will culture but will not empirically treat. Trop neg. EKG unremarkable. U/S pending. At this time, will sign care over to Sabino Dick NP who will follow up with U/S-- if no cholecystitis and pt can tolerate PO prior to leaving, he can manage his pancreatitis outpatient, will write for zantac/zofran/norco and instructions to f/up with PCP in 3 days. Low-fat diet discussed. Please see her notes for further documentation of care/dispo.   BP 140/90 mmHg  Pulse 80  Temp(Src) 99.1 F (37.3 C) (Oral)  Resp 18  SpO2 100%  Meds ordered this encounter  Medications  . ondansetron (ZOFRAN-ODT) disintegrating tablet 4 mg    Sig:   . DISCONTD: oxyCODONE-acetaminophen (PERCOCET/ROXICET) 5-325 MG per tablet 1 tablet    Sig:   . sodium chloride 0.9 % bolus 1,000 mL    Sig:   . HYDROmorphone (DILAUDID) injection 1 mg    Sig:   . metoCLOPramide (REGLAN) injection 10 mg    Sig:   . ondansetron (ZOFRAN) 8 MG tablet    Sig: Take 1 tablet (8 mg total) by mouth every 8 (eight) hours as needed for nausea or vomiting.    Dispense:  10 tablet    Refill:  0    Order Specific Question:  Supervising Provider    Answer:  Hyacinth Meeker, BRIAN [[id number]]  . HYDROcodone-acetaminophen (NORCO) 5-325 MG tablet     Sig: Take 1 tablet by mouth every 6 (six) hours as needed for severe pain.    Dispense:  15 tablet    Refill:  0    Order Specific Question:  Supervising Provider    Answer:  MILLER, BRIAN [[id number]]  . ranitidine (ZANTAC) 150 MG tablet    Sig: Take 1 tablet (150 mg total) by mouth 2 (two) times daily.    Dispense:  30 tablet    Refill:  0    Order Specific Question:  Supervising Provider    Answer:  Eber Hong [[id number]]       Kimoni Pagliarulo Camprubi-Soms, PA-C 09/25/15 0102  Alvira Monday, MD 09/25/15 1320

## 2015-09-24 NOTE — ED Notes (Signed)
Pain medication given in Triage. Patient advised about side effects of medications and  to avoid driving for a minimum of 4 hours.  

## 2015-09-24 NOTE — ED Notes (Signed)
Called x 2 in waiting room without answer.

## 2015-09-25 DIAGNOSIS — M1990 Unspecified osteoarthritis, unspecified site: Secondary | ICD-10-CM | POA: Insufficient documentation

## 2015-09-25 DIAGNOSIS — R1110 Vomiting, unspecified: Secondary | ICD-10-CM | POA: Insufficient documentation

## 2015-09-25 DIAGNOSIS — F17210 Nicotine dependence, cigarettes, uncomplicated: Secondary | ICD-10-CM | POA: Insufficient documentation

## 2015-09-25 DIAGNOSIS — R34 Anuria and oliguria: Secondary | ICD-10-CM | POA: Insufficient documentation

## 2015-09-25 DIAGNOSIS — Z880 Allergy status to penicillin: Secondary | ICD-10-CM | POA: Insufficient documentation

## 2015-09-25 DIAGNOSIS — K5900 Constipation, unspecified: Secondary | ICD-10-CM | POA: Insufficient documentation

## 2015-09-25 DIAGNOSIS — R079 Chest pain, unspecified: Secondary | ICD-10-CM | POA: Insufficient documentation

## 2015-09-25 DIAGNOSIS — M549 Dorsalgia, unspecified: Secondary | ICD-10-CM | POA: Insufficient documentation

## 2015-09-25 DIAGNOSIS — Z79899 Other long term (current) drug therapy: Secondary | ICD-10-CM | POA: Insufficient documentation

## 2015-09-25 DIAGNOSIS — J45909 Unspecified asthma, uncomplicated: Secondary | ICD-10-CM | POA: Insufficient documentation

## 2015-09-25 DIAGNOSIS — R1084 Generalized abdominal pain: Secondary | ICD-10-CM | POA: Insufficient documentation

## 2015-09-25 LAB — I-STAT TROPONIN, ED: TROPONIN I, POC: 0.01 ng/mL (ref 0.00–0.08)

## 2015-09-25 MED ORDER — ONDANSETRON HCL 8 MG PO TABS: 8.0000 mg | ORAL_TABLET | Freq: Three times a day (TID) | ORAL | Status: DC | PRN

## 2015-09-25 MED ORDER — GI COCKTAIL ~~LOC~~
30.0000 mL | Freq: Once | ORAL | Status: AC
  Administered 2015-09-25: 30 mL via ORAL
  Filled 2015-09-25: qty 30

## 2015-09-25 MED ORDER — HYDROCODONE-ACETAMINOPHEN 5-325 MG PO TABS: 1.0000 | ORAL_TABLET | Freq: Four times a day (QID) | ORAL | Status: DC | PRN

## 2015-09-25 MED ORDER — DICYCLOMINE HCL 10 MG PO CAPS
20.0000 mg | ORAL_CAPSULE | Freq: Once | ORAL | Status: AC
  Administered 2015-09-25: 20 mg via ORAL
  Filled 2015-09-25: qty 2

## 2015-09-25 MED ORDER — RANITIDINE HCL 150 MG PO TABS: 150.0000 mg | ORAL_TABLET | Freq: Two times a day (BID) | ORAL | Status: DC

## 2015-09-25 NOTE — Discharge Instructions (Signed)
Your abdominal pain is likely from pancreatitis but could be due to an ulcer in your stomach or gastritis (irritation of the stomach lining). Eat a diet of clear broths, jello, and clear liquids for 1-2 days until symptoms improve, and then you can advance your diet to something like bland chicken and rice and other easy to digest foods (see list attached). You should take over the counter zantac daily to help with symptoms, and avoid spicy/fatty/acidic foods, avoid soda/coffee/tea. AVOID ALCOHOL USE AS THIS MAKES ALL YOUR CONDITIONS WORSE! Avoid red sauces, like pasta or pizza sauce, as these are acidic and can make symptoms worse. Avoid laying down flat within 30 minutes of eating. Avoid NSAIDs like ibuprofen/aleve/motrin/etc on an empty stomach. May consider using over the counter tums/maalox as needed for additional relief. Use zofran as directed as needed for nausea. Use tylenol or norco as needed for pain but don't drive or operate machinery while taking this medication. Follow up with your regular doctor in 3-5 days for ongoing evaluation of your abdominal pain. Return to the ER for changes or worsening symptoms.  Abdominal (belly) pain can be caused by many things. Your caregiver performed an examination and possibly ordered blood/urine tests and imaging (CT scan, x-rays, ultrasound). Many cases can be observed and treated at home after initial evaluation in the emergency department. Even though you are being discharged home, abdominal pain can be unpredictable. Therefore, you need a repeated exam if your pain does not resolve, returns, or worsens. Most patients with abdominal pain don't have to be admitted to the hospital or have surgery, but serious problems like appendicitis and gallbladder attacks can start out as nonspecific pain. Many abdominal conditions cannot be diagnosed in one visit, so follow-up evaluations are very important. SEEK IMMEDIATE MEDICAL ATTENTION IF YOU DEVELOP ANY OF THE  FOLLOWING SYMPTOMS:  The pain does not go away or becomes severe.   A temperature above 101 develops.   Repeated vomiting occurs (multiple episodes).   The pain becomes localized to portions of the abdomen. The right side could possibly be appendicitis. In an adult, the left lower portion of the abdomen could be colitis or diverticulitis.   Blood is being passed in stools or vomit (bright red or black tarry stools).   Return also if you develop chest pain, difficulty breathing, dizziness or fainting, or become confused, poorly responsive, or inconsolable (young children).  The constipation stays for more than 4 days.   There is belly (abdominal) or rectal pain.   You do not seem to be getting better.      Abdominal Pain, Adult Many things can cause belly (abdominal) pain. Most times, the belly pain is not dangerous. Many cases of belly pain can be watched and treated at home. HOME CARE   Do not take medicines that help you go poop (laxatives) unless told to by your doctor.  Only take medicine as told by your doctor.  Eat or drink as told by your doctor. Your doctor will tell you if you should be on a special diet. GET HELP IF:  You do not know what is causing your belly pain.  You have belly pain while you are sick to your stomach (nauseous) or have runny poop (diarrhea).  You have pain while you pee or poop.  Your belly pain wakes you up at night.  You have belly pain that gets worse or better when you eat.  You have belly pain that gets worse when you eat fatty  foods.  You have a fever. GET HELP RIGHT AWAY IF:   The pain does not go away within 2 hours.  You keep throwing up (vomiting).  The pain changes and is only in the right or left part of the belly.  You have bloody or tarry looking poop. MAKE SURE YOU:   Understand these instructions.  Will watch your condition.  Will get help right away if you are not doing well or get worse.   This information  is not intended to replace advice given to you by your health care provider. Make sure you discuss any questions you have with your health care provider.   Document Released: 10/08/2007 Document Revised: 05/12/2014 Document Reviewed: 12/29/2012 Elsevier Interactive Patient Education 2016 [employer]  Nausea and Vomiting Nausea is a sick feeling that often comes before throwing up (vomiting). Vomiting is a reflex where stomach contents come out of your mouth. Vomiting can cause severe loss of body fluids (dehydration). Children and elderly adults can become dehydrated quickly, especially if they also have diarrhea. Nausea and vomiting are symptoms of a condition or disease. It is important to find the cause of your symptoms. CAUSES   Direct irritation of the stomach lining. This irritation can result from increased acid production (gastroesophageal reflux disease), infection, food poisoning, taking certain medicines (such as nonsteroidal anti-inflammatory drugs), alcohol use, or tobacco use.  Signals from the brain.These signals could be caused by a headache, heat exposure, an inner ear disturbance, increased pressure in the brain from injury, infection, a tumor, or a concussion, pain, emotional stimulus, or metabolic problems.  An obstruction in the gastrointestinal tract (bowel obstruction).  Illnesses such as diabetes, hepatitis, gallbladder problems, appendicitis, kidney problems, cancer, sepsis, atypical symptoms of a heart attack, or eating disorders.  Medical treatments such as chemotherapy and radiation.  Receiving medicine that makes you sleep (general anesthetic) during surgery. DIAGNOSIS Your caregiver may ask for tests to be done if the problems do not improve after a few days. Tests may also be done if symptoms are severe or if the reason for the nausea and vomiting is not clear. Tests may include:  Urine tests.  Blood tests.  Stool tests.  Cultures (to look for evidence  of infection).  X-rays or other imaging studies. Test results can help your caregiver make decisions about treatment or the need for additional tests. TREATMENT You need to stay well hydrated. Drink frequently but in small amounts.You may wish to drink water, sports drinks, clear broth, or eat frozen ice pops or gelatin dessert to help stay hydrated.When you eat, eating slowly may help prevent nausea.There are also some antinausea medicines that may help prevent nausea. HOME CARE INSTRUCTIONS   Take all medicine as directed by your caregiver.  If you do not have an appetite, do not force yourself to eat. However, you must continue to drink fluids.  If you have an appetite, eat a normal diet unless your caregiver tells you differently.  Eat a variety of complex carbohydrates (rice, wheat, potatoes, bread), lean meats, yogurt, fruits, and vegetables.  Avoid high-fat foods because they are more difficult to digest.  Drink enough water and fluids to keep your urine clear or pale yellow.  If you are dehydrated, ask your caregiver for specific rehydration instructions. Signs of dehydration may include:  Severe thirst.  Dry lips and mouth.  Dizziness.  Dark urine.  Decreasing urine frequency and amount.  Confusion.  Rapid breathing or pulse. SEEK IMMEDIATE MEDICAL CARE IF:  You have blood or brown flecks (like coffee grounds) in your vomit.  You have black or bloody stools.  You have a severe headache or stiff neck.  You are confused.  You have severe abdominal pain.  You have chest pain or trouble breathing.  You do not urinate at least once every 8 hours.  You develop cold or clammy skin.  You continue to vomit for longer than 24 to 48 hours.  You have a fever. MAKE SURE YOU:   Understand these instructions.  Will watch your condition.  Will get help right away if you are not doing well or get worse.   This information is not intended to replace advice  given to you by your health care provider. Make sure you discuss any questions you have with your health care provider.   Document Released: 04/21/2005 Document Revised: 07/14/2011 Document Reviewed: 09/18/2010 Elsevier Interactive Patient Education [employer]

## 2015-09-25 NOTE — ED Provider Notes (Signed)
CSN: [id number]     Arrival date & time 09/24/15  1755 History   First MD Initiated Contact with Patient 09/24/15 2234     Chief Complaint  Patient presents with  . Abdominal Pain     (Consider location/radiation/quality/duration/timing/severity/associated sxs/prior Treatment) HPI  Past Medical History  Diagnosis Date  . Asthma   . Vertigo   . 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 Arthritis     "right foot, I broke it before"   Past Surgical History  Procedure Laterality Date  . Replantation thumb Right 2000   No family history on file. Social History  Substance Use Topics  . Smoking status: Current Every Day Smoker -- 0.00 packs/day for 0 years    Types: Cigarettes  . Smokeless tobacco: Never Used     Comment: cutting back  . Alcohol Use: Yes     Comment: Drink daily.    Review of Systems    Allergies  Penicillins and Lisinopril  Home Medications   Prior to Admission medications   Medication Sig Start Date End Date Taking? Authorizing Provider  ibuprofen (ADVIL,MOTRIN) 200 MG tablet Take 200 mg by mouth every 6 (six) hours as needed for moderate pain.   Yes Historical Provider, MD  albuterol (PROVENTIL HFA;VENTOLIN HFA) 108 (90 BASE) MCG/ACT inhaler Inhale 2 puffs into the lungs every 6 (six) hours as needed for wheezing or shortness of breath. 07/26/14   Alexa Lucrezia Starch, MD  Chlorphen-Pseudoephed-APAP (THERAFLU FLU/COLD PO) Take 1 tablet by mouth every 6 (six) hours as needed (cold/flu symptoms).    Historical Provider, MD  HYDROcodone-acetaminophen (NORCO) 5-325 MG tablet Take 1 tablet by mouth every 6 (six) hours as needed for severe pain. 09/25/15   Mercedes Camprubi-Soms, PA-C  HYDROcodone-acetaminophen (NORCO/VICODIN) 5-325 MG tablet Take 1-2 tablets by mouth every 4 (four) hours as needed. 08/30/15   Tiffany Neva Seat, PA-C  ondansetron (ZOFRAN) 4 MG tablet Take 1 tablet (4 mg total) by mouth every 6 (six) hours. 08/30/15    Tiffany Neva Seat, PA-C  ondansetron (ZOFRAN) 8 MG tablet Take 1 tablet (8 mg total) by mouth every 8 (eight) hours as needed for nausea or vomiting. 09/25/15   Mercedes Camprubi-Soms, PA-C  oxyCODONE-acetaminophen (PERCOCET/ROXICET) 5-325 MG tablet Take 2 tablets by mouth every 4 (four) hours as needed for severe pain. 09/03/15   [name] Dowless, PA-C  ranitidine (ZANTAC) 150 MG tablet Take 1 tablet (150 mg total) by mouth 2 (two) times daily. 09/25/15   Mercedes Camprubi-Soms, PA-C   BP 140/90 mmHg  Pulse 80  Temp(Src) 99.1 F (37.3 C) (Oral)  Resp 18  SpO2 100% Physical Exam  ED Course  Procedures (including critical care time) Labs Review Labs Reviewed  LIPASE, BLOOD - Abnormal; Notable for the following:    Lipase 111 (*)    All other components within normal limits  COMPREHENSIVE METABOLIC PANEL - Abnormal; Notable for the following:    Sodium 132 (*)    Chloride 97 (*)    Total Protein 8.5 (*)    AST 134 (*)    Total Bilirubin 1.5 (*)    All other components within normal limits  CBC - Abnormal; Notable for the following:    MCV 74.0 (*)    MCH 24.5 (*)    All other components within normal limits  URINALYSIS, ROUTINE W REFLEX MICROSCOPIC (NOT AT [facility]) - Abnormal; Notable for the following:    Color, Urine ORANGE (*)  Specific Gravity, Urine 1.040 (*)    Bilirubin Urine MODERATE (*)    Ketones, ur 40 (*)    Protein, ur 30 (*)    Nitrite POSITIVE (*)    Leukocytes, UA SMALL (*)    All other components within normal limits  URINE MICROSCOPIC-ADD ON - Abnormal; Notable for the following:    Squamous Epithelial / LPF 0-5 (*)    Bacteria, UA FEW (*)    All other components within normal limits  URINE CULTURE  I-STAT TROPOININ, ED    Imaging Review Koreas Abdomen Complete  09/25/2015  CLINICAL DATA:  Acute onset of right upper quadrant abdominal pain and tenderness. Elevated LFTs and lipase. Nausea and vomiting. Initial encounter. EXAM: ABDOMEN ULTRASOUND COMPLETE  COMPARISON:  CT of the abdomen and pelvis from 12/29/2013 FINDINGS: Gallbladder: Sludge and gravel are noted within the gallbladder. The gallbladder is otherwise unremarkable in appearance. No gallbladder wall thickening or pericholecystic fluid is seen. No ultrasonographic Murphy's sign is elicited. Common bile duct: Diameter: 0.3 cm, within normal limits in caliber. Liver: No focal lesion identified. Increased parenchymal echogenicity and coarsened echotexture, compatible with fatty infiltration. IVC: No abnormality visualized. Pancreas: Not well characterized due to overlying bowel gas. Spleen: Size and appearance within normal limits. Right Kidney: Length: 12.3 cm. Echogenicity within normal limits. No mass or hydronephrosis visualized. Left Kidney: Length: 11.9 cm. Echogenicity within normal limits. No mass or hydronephrosis visualized. Abdominal aorta: No aneurysm visualized. Not well characterized distally due to overlying bowel gas. Other findings: None. IMPRESSION: 1. No acute abnormality seen within the abdomen. 2. Mild sludge and gravel noted within the gallbladder. Gallbladder otherwise unremarkable in appearance. No evidence for obstruction or cholecystitis. 3. Diffuse fatty infiltration within the liver. Electronically Signed   By: Roanna RaiderJeffery  Chang M.D.   On: 09/25/2015 01:23   I have personally reviewed and evaluated these images and lab results as part of my medical decision-making.   EKG Interpretation   Date/Time:  Monday Sep 24 2015 23:08:18 EDT Ventricular Rate:  74 PR Interval:  130 QRS Duration: 87 QT Interval:  410 QTC Calculation: 455 R Axis:   78 Text Interpretation:  Sinus rhythm No significant change since last  tracing Confirmed by [facility] Medical CenterCHLOSSMAN MD, ERIN ([id number]) on 09/25/2015 12:28:06 AM     Ultrasound reviewed, revealing sludge.  No gallstones or obstruction, which is same as previous ultrasounds is being discharged home.  Her previous PA instructions MDM   Final  diagnoses:  Upper abdominal pain  Non-intractable vomiting with nausea, vomiting of unspecified type  Alcohol-induced chronic pancreatitis (HCC)  Elevated transaminase level  Elevated bilirubin  Essential hypertension  Alcohol abuse         Earley FavorGail Afomia Blackley, NP 09/25/15 0133  Benjiman CoreNathan Pickering, MD 09/26/15 [phone]

## 2015-09-26 ENCOUNTER — Encounter (HOSPITAL_COMMUNITY): Payer: Self-pay | Admitting: Adult Health

## 2015-09-26 ENCOUNTER — Emergency Department (HOSPITAL_COMMUNITY)
Admission: EM | Admit: 2015-09-26 | Discharge: 2015-09-26 | Disposition: A | Discharge status: Home / Self Care | Payer: Medicaid Other | Attending: Emergency Medicine | Admitting: Emergency Medicine

## 2015-09-26 DIAGNOSIS — R1084 Generalized abdominal pain: Secondary | ICD-10-CM

## 2015-09-26 LAB — COMPREHENSIVE METABOLIC PANEL
ALBUMIN: 4.1 g/dL (ref 3.5–5.0)
ALK PHOS: 88 U/L (ref 38–126)
ALT: 63 U/L (ref 17–63)
ANION GAP: 11 (ref 5–15)
AST: 150 U/L — AB (ref 15–41)
BUN: 7 mg/dL (ref 6–20)
CALCIUM: 10 mg/dL (ref 8.9–10.3)
CO2: 24 mmol/L (ref 22–32)
Chloride: 98 mmol/L — ABNORMAL LOW (ref 101–111)
Creatinine, Ser: 0.81 mg/dL (ref 0.61–1.24)
GFR calc non Af Amer: >60 mL/min (ref >60)
GLUCOSE: 92 mg/dL (ref 65–99)
POTASSIUM: 3.8 mmol/L (ref 3.5–5.1)
Sodium: 133 mmol/L — ABNORMAL LOW (ref 135–145)
TOTAL PROTEIN: 7.6 g/dL (ref 6.5–8.1)
Total Bilirubin: 1.5 mg/dL — ABNORMAL HIGH (ref 0.3–1.2)

## 2015-09-26 LAB — CBC
HEMATOCRIT: 40 % (ref 39.0–52.0)
HEMOGLOBIN: 12.8 g/dL — AB (ref 13.0–17.0)
MCH: 24.3 pg — AB (ref 26.0–34.0)
MCHC: 32 g/dL (ref 30.0–36.0)
MCV: 75.9 fL — ABNORMAL LOW (ref 78.0–100.0)
Platelets: 130 10*3/uL — ABNORMAL LOW (ref 150–400)
RBC: 5.27 MIL/uL (ref 4.22–5.81)
RDW: 14.6 % (ref 11.5–15.5)
WBC: 6.7 10*3/uL (ref 4.0–10.5)

## 2015-09-26 LAB — LIPASE, BLOOD: Lipase: 92 U/L — ABNORMAL HIGH (ref 11–51)

## 2015-09-26 LAB — URINE CULTURE: Culture: NO GROWTH

## 2015-09-26 MED ORDER — ONDANSETRON 4 MG PO TBDP
ORAL_TABLET | ORAL | Status: AC
  Filled 2015-09-26: qty 1

## 2015-09-26 MED ORDER — ONDANSETRON 4 MG PO TBDP
4.0000 mg | ORAL_TABLET | Freq: Once | ORAL | Status: AC | PRN
  Administered 2015-09-26: 4 mg via ORAL

## 2015-09-26 MED ORDER — ONDANSETRON HCL 4 MG/2ML IJ SOLN
4.0000 mg | Freq: Once | INTRAMUSCULAR | Status: AC
  Administered 2015-09-26: 4 mg via INTRAVENOUS
  Filled 2015-09-26: qty 2

## 2015-09-26 MED ORDER — SODIUM CHLORIDE 0.9 % IV BOLUS (SEPSIS)
2000.0000 mL | Freq: Once | INTRAVENOUS | Status: AC
  Administered 2015-09-26: 2000 mL via INTRAVENOUS

## 2015-09-26 MED ORDER — HYDROMORPHONE HCL 1 MG/ML IJ SOLN
1.0000 mg | Freq: Once | INTRAMUSCULAR | Status: AC
  Administered 2015-09-26: 1 mg via INTRAVENOUS
  Filled 2015-09-26: qty 1

## 2015-09-26 MED ORDER — MORPHINE SULFATE (PF) 4 MG/ML IV SOLN
6.0000 mg | Freq: Once | INTRAVENOUS | Status: AC
  Administered 2015-09-26: 6 mg via INTRAVENOUS
  Filled 2015-09-26: qty 2

## 2015-09-26 NOTE — ED Provider Notes (Signed)
History  By signing my name below, I, Earmon Phoenix, attest that this documentation has been prepared under the direction and in the presence of Tomasita Crumble, MD. Electronically Signed: Earmon Phoenix, ED Scribe. 09/26/2015. 3:55 AM.  Chief Complaint  Patient presents with  . Abdominal Pain   The history is provided by the patient and medical records. No language interpreter was used.    HPI Comments:  Riley Kim is a 40 y.o. male with PMHx of alcohol-induced pancreatitis who presents to the Emergency Department complaining of abdominal pain that began 3 days ago. He reports associated rib pain, back pain and chest pain that began yesterday. He states he was at [facility] yesterday and received prescriptions for Zofran, Vicodin and Zantac in which he has not filled yet. He reports associated vomiting, constipation and decreased urinary output he feels is from dehydration. He states this feels like his normal pancreatitis flare up. He states he feels like this was exacerbated by something he ate. Touching the area increases his pain. He denies alleviating factors. He denies fever, chills, diarrhea.   Past Medical History  Diagnosis Date  . Asthma   . Vertigo   . 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 Arthritis     "right foot, I broke it before"   Past Surgical History  Procedure Laterality Date  . Replantation thumb Right 2000   History reviewed. No pertinent family history. Social History  Substance Use Topics  . Smoking status: Current Every Day Smoker -- 0.00 packs/day for 0 years    Types: Cigarettes  . Smokeless tobacco: Never Used     Comment: cutting back  . Alcohol Use: Yes     Comment: Drink daily.    Review of Systems A complete 10 system review of systems was obtained and all systems are negative except as noted in the HPI and PMH.   Allergies  Penicillins and Lisinopril  Home Medications   Prior to Admission  medications   Medication Sig Start Date End Date Taking? Authorizing Provider  ondansetron (ZOFRAN) 4 MG tablet Take 1 tablet (4 mg total) by mouth every 6 (six) hours. 08/30/15  Yes Tiffany Neva Seat, PA-C  albuterol (PROVENTIL HFA;VENTOLIN HFA) 108 (90 BASE) MCG/ACT inhaler Inhale 2 puffs into the lungs every 6 (six) hours as needed for wheezing or shortness of breath. Patient not taking: Reported on 09/26/2015 07/26/14   Alexa Lucrezia Starch, MD  HYDROcodone-acetaminophen (NORCO) 5-325 MG tablet Take 1 tablet by mouth every 6 (six) hours as needed for severe pain. 09/25/15   Mercedes Camprubi-Soms, PA-C  HYDROcodone-acetaminophen (NORCO/VICODIN) 5-325 MG tablet Take 1-2 tablets by mouth every 4 (four) hours as needed. Patient not taking: Reported on 09/26/2015 08/30/15   Marlon Pel, PA-C  ondansetron (ZOFRAN) 8 MG tablet Take 1 tablet (8 mg total) by mouth every 8 (eight) hours as needed for nausea or vomiting. 09/25/15   Mercedes Camprubi-Soms, PA-C  oxyCODONE-acetaminophen (PERCOCET/ROXICET) 5-325 MG tablet Take 2 tablets by mouth every 4 (four) hours as needed for severe pain. Patient not taking: Reported on 09/26/2015 09/03/15   [name] Dowless, PA-C  ranitidine (ZANTAC) 150 MG tablet Take 1 tablet (150 mg total) by mouth 2 (two) times daily. 09/25/15   Mercedes Camprubi-Soms, PA-C   Triage Vitals: BP 182/98 mmHg  Pulse 94  Temp(Src) 98.3 F (36.8 C) (Oral)  Resp 20  SpO2 100% Physical Exam  Constitutional: He is oriented to person, place, and time.  Vital signs are normal. He appears well-developed and well-nourished.  Non-toxic appearance. He does not appear ill. No distress.  HENT:  Head: Normocephalic and atraumatic.  Nose: Nose normal.  Mouth/Throat: Oropharynx is clear and moist. No oropharyngeal exudate.  Eyes: Conjunctivae and EOM are normal. Pupils are equal, round, and reactive to light. No scleral icterus.  Neck: Normal range of motion. Neck supple. No tracheal deviation, no edema,  no erythema and normal range of motion present. No thyroid mass and no thyromegaly present.  Cardiovascular: Normal rate, regular rhythm, S1 normal, S2 normal, normal heart sounds, intact distal pulses and normal pulses.  Exam reveals no gallop and no friction rub.   No murmur heard. Pulmonary/Chest: Effort normal and breath sounds normal. No respiratory distress. He has no wheezes. He has no rhonchi. He has no rales.  Abdominal: Soft. Normal appearance and bowel sounds are normal. He exhibits no distension, no ascites and no mass. There is no hepatosplenomegaly. There is tenderness. There is no rebound, no guarding and no CVA tenderness.  Diffused tenderness to palpation of the abdomen.  Musculoskeletal: Normal range of motion. He exhibits no edema or tenderness.  Lymphadenopathy:    He has no cervical adenopathy.  Neurological: He is alert and oriented to person, place, and time. He has normal strength. No cranial nerve deficit or sensory deficit.  Skin: Skin is warm, dry and intact. No petechiae and no rash noted. He is not diaphoretic. No erythema. No pallor.  Psychiatric: He has a normal mood and affect. His behavior is normal. Judgment normal.  Nursing note and vitals reviewed.   ED Course  Procedures (including critical care time) DIAGNOSTIC STUDIES: Oxygen Saturation is 100% on RA, normal by my interpretation.   COORDINATION OF CARE: 2:32 AM- Will order IV fluids and Zofran. Pt verbalizes understanding and agrees to plan.  Medications  ondansetron (ZOFRAN-ODT) 4 MG disintegrating tablet (not administered)  ondansetron (ZOFRAN-ODT) disintegrating tablet 4 mg (4 mg Oral Given 09/26/15 0008)  morphine 4 MG/ML injection 6 mg (6 mg Intravenous Given 09/26/15 0242)  sodium chloride 0.9 % bolus 2,000 mL (2,000 mLs Intravenous New Bag/Given 09/26/15 0242)  ondansetron (ZOFRAN) injection 4 mg (4 mg Intravenous Given 09/26/15 0242)    Labs Review Labs Reviewed  LIPASE, BLOOD - Abnormal;  Notable for the following:    Lipase 92 (*)    All other components within normal limits  COMPREHENSIVE METABOLIC PANEL - Abnormal; Notable for the following:    Sodium 133 (*)    Chloride 98 (*)    AST 150 (*)    Total Bilirubin 1.5 (*)    All other components within normal limits  CBC - Abnormal; Notable for the following:    Hemoglobin 12.8 (*)    MCV 75.9 (*)    MCH 24.3 (*)    Platelets 130 (*)    All other components within normal limits    Imaging Review Koreas Abdomen Complete  09/25/2015  CLINICAL DATA:  Acute onset of right upper quadrant abdominal pain and tenderness. Elevated LFTs and lipase. Nausea and vomiting. Initial encounter. EXAM: ABDOMEN ULTRASOUND COMPLETE COMPARISON:  CT of the abdomen and pelvis from 12/29/2013 FINDINGS: Gallbladder: Sludge and gravel are noted within the gallbladder. The gallbladder is otherwise unremarkable in appearance. No gallbladder wall thickening or pericholecystic fluid is seen. No ultrasonographic Murphy's sign is elicited. Common bile duct: Diameter: 0.3 cm, within normal limits in caliber. Liver: No focal lesion identified. Increased parenchymal echogenicity and coarsened echotexture, compatible with  fatty infiltration. IVC: No abnormality visualized. Pancreas: Not well characterized due to overlying bowel gas. Spleen: Size and appearance within normal limits. Right Kidney: Length: 12.3 cm. Echogenicity within normal limits. No mass or hydronephrosis visualized. Left Kidney: Length: 11.9 cm. Echogenicity within normal limits. No mass or hydronephrosis visualized. Abdominal aorta: No aneurysm visualized. Not well characterized distally due to overlying bowel gas. Other findings: None. IMPRESSION: 1. No acute abnormality seen within the abdomen. 2. Mild sludge and gravel noted within the gallbladder. Gallbladder otherwise unremarkable in appearance. No evidence for obstruction or cholecystitis. 3. Diffuse fatty infiltration within the liver.  Electronically Signed   By: [name] M.D.   On: 09/25/2015 01:23   I have personally reviewed and evaluated these images and lab results as part of my medical decision-making.   EKG Interpretation   Date/Time:  Wednesday Sep 26 2015 02:08:32 EDT Ventricular Rate:  75 PR Interval:  125 QRS Duration: 96 QT Interval:  408 QTC Calculation: 456 R Axis:   71 Text Interpretation:  Sinus rhythm No significant change since last  tracing Confirmed by Erroll Luna [phone]) on 09/26/2015 2:14:33 AM      MDM   Final diagnoses:  None    Patient presents tot he emergency department for abdominal pain. He states is consistent with his pancreatitis. He felt somewhat better after his recent stay at [facility] with states his symptoms came back. He was given Zofran 2 doses of Dilaudid in the emergency department with good resolution of his pain. He artery has prescriptions from his last visit. He states he can get those filled today. Primary care follow-up advised in 3 days, he appears well and in no acute distress, vital signs were within his normal limits and he is safe for discharge.    I personally performed the services described in this documentation, which was scribed in my presence. The recorded information has been reviewed and is accurate.      Tomasita Crumble, MD 09/26/15 [phone]

## 2015-09-26 NOTE — Discharge Instructions (Signed)
Abdominal Pain, Adult Mr. [name], fill your prescriptions and take medications as indicated. See a primary care physician within 3 days for close follow-up. If symptoms worsen, come back to emergency department immediately. Thank you. Many things can cause belly (abdominal) pain. Most times, the belly pain is not dangerous. Many cases of belly pain can be watched and treated at home. HOME CARE   Do not take medicines that help you go poop (laxatives) unless told to by your doctor.  Only take medicine as told by your doctor.  Eat or drink as told by your doctor. Your doctor will tell you if you should be on a special diet. GET HELP IF:  You do not know what is causing your belly pain.  You have belly pain while you are sick to your stomach (nauseous) or have runny poop (diarrhea).  You have pain while you pee or poop.  Your belly pain wakes you up at night.  You have belly pain that gets worse or better when you eat.  You have belly pain that gets worse when you eat fatty foods.  You have a fever. GET HELP RIGHT AWAY IF:   The pain does not go away within 2 hours.  You keep throwing up (vomiting).  The pain changes and is only in the right or left part of the belly.  You have bloody or tarry looking poop. MAKE SURE YOU:   Understand these instructions.  Will watch your condition.  Will get help right away if you are not doing well or get worse.   This information is not intended to replace advice given to you by your health care provider. Make sure you discuss any questions you have with your health care provider.   Document Released: 10/08/2007 Document Revised: 05/12/2014 Document Reviewed: 12/29/2012 Elsevier Interactive Patient Education [employer]2016 [employer] Inc.

## 2015-09-26 NOTE — ED Notes (Signed)
Presents with upper abdominal pain, chest pain and vomiting. He reports that he is having a pancreatitis flare up and he feels worse after leaving [place] yesterday.  3 bouts of emesis in 24 hours. Pain is described as severe. Unable to hold down fluids.

## 2015-10-05 NOTE — MAU Provider Note (Deleted)
S:  [name] is a 40 y.o. male presenting to MAU with concerns about pain in his left wrist/left arm.  20 minutes prior to coming into MAU the patient reports that he slipped at his work site. When he fell he tried putting all his weight on his left arm/ hand. When he bends his arm at his elbow he feels shooting/sharp pain in his left arm.   Last night he drank alcohol. " I feel hungover this morning" "Im not trying to sue [employer] however I just want to find out what is wrong with my hand".   He denies alcohol use this morning    O:  GENERAL: Well-developed, well-nourished male in some distress. Patient appropriate however slurring his speech  LUNGS: Effort normal SKIN: Warm, dry and without erythema MUSCULOSKELETAL: Pain with flexion and extension of left wrist and left fingers. + decrease ROM of left fingers.   Filed Vitals:   09/03/15 1100  BP: 134/84  Pulse: 95  Temp: 98.1 F (36.7 C)  Resp: 22   MDM:  Discussed plan of care with Dr. Molli KnockStinson [place] ED physician contacted, will make [facility]. Dr. Particia NearingHaviland is the accepting physician.    A:  Fall, initial encounter  Wrist pain, acute, left  Distal radius fracture, left, closed, initial encounter   P:  [facility]   Duane LopeJennifer I Jaylan Hinojosa, NP 09/03/2015 4:59 PM

## 2016-04-21 ENCOUNTER — Other Ambulatory Visit: Payer: Self-pay | Admitting: Internal Medicine

## 2016-09-18 ENCOUNTER — Emergency Department (HOSPITAL_COMMUNITY): Payer: Self-pay

## 2016-09-18 ENCOUNTER — Emergency Department (HOSPITAL_COMMUNITY)
Admission: EM | Admit: 2016-09-18 | Discharge: 2016-09-19 | Disposition: A | Discharge status: Home / Self Care | Payer: Self-pay | Attending: Emergency Medicine | Admitting: Emergency Medicine

## 2016-09-18 ENCOUNTER — Encounter (HOSPITAL_COMMUNITY): Payer: Self-pay

## 2016-09-18 DIAGNOSIS — K859 Acute pancreatitis without necrosis or infection, unspecified: Secondary | ICD-10-CM | POA: Insufficient documentation

## 2016-09-18 DIAGNOSIS — F17210 Nicotine dependence, cigarettes, uncomplicated: Secondary | ICD-10-CM | POA: Insufficient documentation

## 2016-09-18 DIAGNOSIS — I10 Essential (primary) hypertension: Secondary | ICD-10-CM | POA: Insufficient documentation

## 2016-09-18 DIAGNOSIS — R1013 Epigastric pain: Secondary | ICD-10-CM

## 2016-09-18 DIAGNOSIS — Z79899 Other long term (current) drug therapy: Secondary | ICD-10-CM | POA: Insufficient documentation

## 2016-09-18 LAB — CBC
HCT: 42.6 % (ref 39.0–52.0)
Hemoglobin: 14.3 g/dL (ref 13.0–17.0)
MCH: 25.2 pg — ABNORMAL LOW (ref 26.0–34.0)
MCHC: 33.6 g/dL (ref 30.0–36.0)
MCV: 75.1 fL — ABNORMAL LOW (ref 78.0–100.0)
Platelets: DECREASED 10*3/uL (ref 150–400)
RBC: 5.67 MIL/uL (ref 4.22–5.81)
RDW: 14.5 % (ref 11.5–15.5)
WBC: 8.6 10*3/uL (ref 4.0–10.5)

## 2016-09-18 LAB — COMPREHENSIVE METABOLIC PANEL
ALBUMIN: 4.5 g/dL (ref 3.5–5.0)
ALK PHOS: 64 U/L (ref 38–126)
ALT: 19 U/L (ref 17–63)
ANION GAP: 14 (ref 5–15)
AST: 47 U/L — AB (ref 15–41)
BUN: 8 mg/dL (ref 6–20)
CALCIUM: 10.3 mg/dL (ref 8.9–10.3)
CO2: 28 mmol/L (ref 22–32)
Chloride: 92 mmol/L — ABNORMAL LOW (ref 101–111)
Creatinine, Ser: 0.82 mg/dL (ref 0.61–1.24)
GFR calc Af Amer: >60 mL/min (ref >60)
GFR calc non Af Amer: >60 mL/min (ref >60)
GLUCOSE: 98 mg/dL (ref 65–99)
POTASSIUM: 4 mmol/L (ref 3.5–5.1)
SODIUM: 134 mmol/L — AB (ref 135–145)
Total Bilirubin: 1.5 mg/dL — ABNORMAL HIGH (ref 0.3–1.2)
Total Protein: 8.5 g/dL — ABNORMAL HIGH (ref 6.5–8.1)

## 2016-09-18 LAB — LIPASE, BLOOD: Lipase: 132 U/L — ABNORMAL HIGH (ref 11–51)

## 2016-09-18 MED ORDER — ONDANSETRON 4 MG PO TBDP
4.0000 mg | ORAL_TABLET | Freq: Once | ORAL | Status: AC
  Administered 2016-09-18: 4 mg via ORAL

## 2016-09-18 MED ORDER — DICYCLOMINE HCL 10 MG/ML IM SOLN
20.0000 mg | Freq: Once | INTRAMUSCULAR | Status: AC
  Administered 2016-09-18: 20 mg via INTRAMUSCULAR
  Filled 2016-09-18: qty 2

## 2016-09-18 MED ORDER — MORPHINE SULFATE (PF) 4 MG/ML IV SOLN
4.0000 mg | Freq: Once | INTRAVENOUS | Status: AC
  Administered 2016-09-18: 4 mg via INTRAVENOUS
  Filled 2016-09-18: qty 1

## 2016-09-18 MED ORDER — ONDANSETRON 4 MG PO TBDP
ORAL_TABLET | ORAL | Status: AC
  Filled 2016-09-18: qty 1

## 2016-09-18 MED ORDER — SODIUM CHLORIDE 0.9 % IV BOLUS (SEPSIS)
1000.0000 mL | Freq: Once | INTRAVENOUS | Status: AC
  Administered 2016-09-18: 1000 mL via INTRAVENOUS

## 2016-09-18 NOTE — ED Notes (Signed)
ED Provider at bedside. 

## 2016-09-18 NOTE — ED Triage Notes (Signed)
Pt reports upper abdominal pain that radiates to his back, onset two days ago associated with vomiting. He states this feels similar to his pancreatitis flare ups. Unable to tolerate PO.

## 2016-09-19 MED ORDER — PANTOPRAZOLE SODIUM 20 MG PO TBEC: 20.0000 mg | DELAYED_RELEASE_TABLET | Freq: Two times a day (BID) | ORAL | 0 refills | Status: AC

## 2016-09-19 MED ORDER — ACETAMINOPHEN 500 MG PO TABS
1000.0000 mg | ORAL_TABLET | Freq: Once | ORAL | Status: AC
  Administered 2016-09-19: 1000 mg via ORAL
  Filled 2016-09-19: qty 2

## 2016-09-19 MED ORDER — OXYCODONE HCL 5 MG PO TABS
5.0000 mg | ORAL_TABLET | Freq: Once | ORAL | Status: AC
  Administered 2016-09-19: 5 mg via ORAL
  Filled 2016-09-19: qty 1

## 2016-09-19 MED ORDER — DICYCLOMINE HCL 20 MG PO TABS: 20.0000 mg | ORAL_TABLET | Freq: Two times a day (BID) | ORAL | 0 refills | Status: DC

## 2016-09-19 MED ORDER — ONDANSETRON 4 MG PO TBDP: 4.0000 mg | ORAL_TABLET | Freq: Three times a day (TID) | ORAL | 0 refills | Status: DC | PRN

## 2016-09-19 MED ORDER — OXYCODONE HCL 5 MG PO TABS: 5.0000 mg | ORAL_TABLET | ORAL | 0 refills | Status: DC | PRN

## 2016-09-19 NOTE — ED Notes (Signed)
Pt departed in NAD, refused use of wheelchair.  

## 2016-09-19 NOTE — ED Provider Notes (Signed)
[facility] Provider Note   CSN: [id number] Arrival date & time: 09/18/16  1847     History   Chief Complaint Chief Complaint  Patient presents with  . Abdominal Pain    HPI Riley Kim is a 41 y.o. male.  HPI   41 year old male with a history of pancreatitis presents with concern for epigastric abdominal pain beginning Tuesday morning. Reports that it is epigastric with radiation to the back and right upper quadrant. It worsens with eating. Reports that he's been unable to keep anything down. Denies diarrhea. Reports constipation today. Reports his mom thought he had a fever yesterday, gave him something for it. Reports this feels identical to his prior episodes of pancreatitis. Reports that he drank alcohol on Saturday night. Denies NSAID use. Denies history of abdominal surgeries. Reports pain is severe at this time. Reports that his pancreatitis pain has improved with Dilaudid injections in the past.  Past Medical History:  Diagnosis Date  . 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 Arthritis    "right foot, I broke it before"  . Asthma   . Vertigo     Patient Active Problem List   Diagnosis Date Noted  . Pancreatitis 10/14/2014  . Elevated transaminase level   . Elevated bilirubin   . 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 Essential hypertension 12/31/2013  . Alcohol abuse 12/29/2013  . Mild persistent asthma in adult without complication 12/29/2013  . Smoking greater than 20 pack years 12/29/2013    Past Surgical History:  Procedure Laterality Date  . REPLANTATION THUMB Right 2000       Home Medications    Prior to Admission medications   Medication Sig Start Date End Date Taking? Authorizing Provider  albuterol (PROVENTIL HFA;VENTOLIN HFA) 108 (90 BASE) MCG/ACT inhaler Inhale 2 puffs into the lungs every 6 (six) hours as needed for wheezing or shortness of breath. Patient not taking: Reported on 09/26/2015  07/26/14   Servando SnareBurns, Alexa R, MD  dicyclomine (BENTYL) 20 MG tablet Take 1 tablet (20 mg total) by mouth 2 (two) times daily. 09/19/16   Alvira MondaySchlossman, Riley Mcquiston, MD  ondansetron (ZOFRAN ODT) 4 MG disintegrating tablet Take 1 tablet (4 mg total) by mouth every 8 (eight) hours as needed for nausea or vomiting. 09/19/16   Alvira MondaySchlossman, Riley Morton, MD  oxyCODONE (ROXICODONE) 5 MG immediate release tablet Take 1 tablet (5 mg total) by mouth every 4 (four) hours as needed for severe pain. 09/19/16   Alvira MondaySchlossman, Riley Hovis, MD  pantoprazole (PROTONIX) 20 MG tablet Take 1 tablet (20 mg total) by mouth 2 (two) times daily. 09/19/16   Alvira MondaySchlossman, Riley Hackley, MD    Family History No family history on file.  Social History Social History  Substance Use Topics  . Smoking status: Current Every Day Smoker    Packs/day: 0.00    Years: 0.00    Types: Cigarettes  . Smokeless tobacco: Never Used     Comment: cutting back  . Alcohol use Yes     Comment: Drink daily.     Allergies   Penicillins and Lisinopril   Review of Systems Review of Systems  Constitutional: Negative for fever.  HENT: Negative for sore throat.   Eyes: Negative for visual disturbance.  Respiratory: Negative for shortness of breath.   Cardiovascular: Negative for chest pain.  Gastrointestinal: Positive for abdominal pain, constipation, nausea and vomiting. Negative for diarrhea.  Genitourinary: Negative for difficulty urinating.  Musculoskeletal: Positive for back pain. Negative for  neck stiffness.  Skin: Negative for rash.  Neurological: Negative for syncope and headaches.     Physical Exam Updated Vital Signs BP 140/86   Pulse 80   Temp 98.7 F (37.1 C) (Oral)   Resp 16   SpO2 94%   Physical Exam  Constitutional: He is oriented to person, place, and time. He appears well-developed and well-nourished. No distress.  HENT:  Head: Normocephalic and atraumatic.  Eyes: Conjunctivae and EOM are normal.  Neck: Normal range of motion.  Cardiovascular:  Normal rate, regular rhythm, normal heart sounds and intact distal pulses.  Exam reveals no gallop and no friction rub.   No murmur heard. Pulmonary/Chest: Effort normal and breath sounds normal. No respiratory distress. He has no wheezes. He has no rales.  Abdominal: Soft. He exhibits no distension. There is tenderness (epigastric, RUQ).  Musculoskeletal: He exhibits no edema.  Neurological: He is alert and oriented to person, place, and time.  Skin: Skin is warm and dry. He is not diaphoretic.  Nursing note and vitals reviewed.    ED Treatments / Results  Labs (all labs ordered are listed, but only abnormal results are displayed) Labs Reviewed  CBC - Abnormal; Notable for the following:       Result Value   MCV 75.1 (*)    MCH 25.2 (*)    All other components within normal limits  COMPREHENSIVE METABOLIC PANEL - Abnormal; Notable for the following:    Sodium 134 (*)    Chloride 92 (*)    Total Protein 8.5 (*)    AST 47 (*)    Total Bilirubin 1.5 (*)    All other components within normal limits  LIPASE, BLOOD - Abnormal; Notable for the following:    Lipase 132 (*)    All other components within normal limits  URINALYSIS, ROUTINE W REFLEX MICROSCOPIC    EKG  EKG Interpretation None       Radiology US Abdomen Limited Ruq  Result Date: 09/18/2016 CLINICAL DATA:  Epigastric pain for 2 days with elevated lipase. EXAM: US ABDOMEN LIMITED - RIGHT UPPER QUADRANT COMPARISON:  None. FINDINGS: Gallbladder: No gallstones or wall thickening visualized. No sonographic Murphy sign noted by sonographer. Previously seen sludge in the gallbladder is not identified on the current study. Common bile duct: Diameter: 4 mm Liver: The hepatic parenchyma is echogenic without focal lesion. IMPRESSION: 1. No cholelithiasis or other evidence acute cholecystitis. 2. Echogenic hepatic parenchyma likely indicates hepatic steatosis. Electronically Signed   By: Deatra Robinson M.D.   On: 09/18/2016 23:19      Procedures Procedures (including critical care time)  Medications Ordered in ED Medications  oxyCODONE (Oxy IR/ROXICODONE) immediate release tablet 5 mg (not administered)  acetaminophen (TYLENOL) tablet 1,000 mg (not administered)  ondansetron (ZOFRAN-ODT) disintegrating tablet 4 mg (4 mg Oral Given 09/18/16 1931)  sodium chloride 0.9 % bolus 1,000 mL (0 mLs Intravenous Stopped 09/19/16 0007)  dicyclomine (BENTYL) injection 20 mg (20 mg Intramuscular Given 09/18/16 2227)  morphine 4 MG/ML injection 4 mg (4 mg Intravenous Given 09/18/16 2227)     Initial Impression / Assessment and Plan / ED Course  I have reviewed the triage vital signs and the nursing notes.  Pertinent labs & imaging results that were available during my care of the patient were reviewed by me and considered in my medical decision making (see chart for details).     41 year old male with history of alcohol-induced pancreatitis presents with concern for epigastric pain that he reports  similar to prior episodes. Labs show mild elevation in AST which is improved from prior. Lipase is elevated to 132, not technically pancreatitis, however feel it suggests an element of pancreatic inflammation. Given pain in radiating to the right side worse with eating, right upper quadrant ultrasound was done which showed no evidence of cholecystitis, however did show fatty liver. Encourage alcohol cessation and primary care physician follow-up. Patient was given Bentyl and 1 dose of morphine and Zofran in the emergency department. Reports improvement of pain.  Able tolerate by mouth.  Reviewed patient in the [place] controlled substances reporting system, and he was found to have no narcotic prescriptions in the last 6 months. Given degree of pain and evidence of mild pancreatitis, discussed that we'll give him 15 tablets of oxycodone, discussed risks, and encouraged him to use other medications when possible. He is given a prescription  for Bentyl, Protonix, oxycodone, and Zofran.  Patient discharged in stable condition with understanding of reasons to return.   Final Clinical Impressions(s) / ED Diagnoses   Final diagnoses:  Epigastric pain  Acute mild pancreatitis, unspecified complication status, unspecified pancreatitis type    New Prescriptions New Prescriptions   DICYCLOMINE (BENTYL) 20 MG TABLET    Take 1 tablet (20 mg total) by mouth 2 (two) times daily.   ONDANSETRON (ZOFRAN ODT) 4 MG DISINTEGRATING TABLET    Take 1 tablet (4 mg total) by mouth every 8 (eight) hours as needed for nausea or vomiting.   OXYCODONE (ROXICODONE) 5 MG IMMEDIATE RELEASE TABLET    Take 1 tablet (5 mg total) by mouth every 4 (four) hours as needed for severe pain.   PANTOPRAZOLE (PROTONIX) 20 MG TABLET    Take 1 tablet (20 mg total) by mouth 2 (two) times daily.     Alvira Monday, MD 09/19/16 [phone]

## 2016-09-19 NOTE — ED Notes (Signed)
ED Provider at bedside. 

## 2017-01-09 ENCOUNTER — Encounter (HOSPITAL_COMMUNITY): Payer: Self-pay

## 2017-01-09 ENCOUNTER — Ambulatory Visit (INDEPENDENT_AMBULATORY_CARE_PROVIDER_SITE_OTHER): Payer: Self-pay

## 2017-01-09 ENCOUNTER — Telehealth (HOSPITAL_COMMUNITY): Payer: Self-pay | Admitting: Family

## 2017-01-09 ENCOUNTER — Ambulatory Visit (HOSPITAL_COMMUNITY)
Admission: EM | Admit: 2017-01-09 | Discharge: 2017-01-09 | Disposition: A | Discharge status: Home / Self Care | Payer: Self-pay | Attending: Family | Admitting: Family

## 2017-01-09 DIAGNOSIS — S63501A Unspecified sprain of right wrist, initial encounter: Secondary | ICD-10-CM
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
## 2017-01-09 NOTE — Discharge Instructions (Signed)
Suspect wrist sprain.  Ice, ace wrap and elevation If continued pain, please contact orthopedic office as may need further evaluation.  If there is no improvement in your symptoms, or if there is any worsening of symptoms, or if you have any additional concerns, please return for re-evaluation; or, if we are closed, consider going to the Emergency Room for evaluation if symptoms urgent.

## 2017-01-09 NOTE — Telephone Encounter (Signed)
Please call pt and see how right wrist swelling is.   Has it improved with ice, elevation, ace wrap?   Any numbness?   ( saw him at urgent care in [place])

## 2017-01-12 ENCOUNTER — Telehealth: Payer: Self-pay | Admitting: Family

## 2017-01-12 NOTE — Telephone Encounter (Signed)
Left message for patient to return call back.  

## 2017-01-12 NOTE — Telephone Encounter (Signed)
FYI clinical staff  I called to follow up after suspected wrist sprain.  Spoke with patient about right wrist; he states that cannot tell if numbness, swelling has improved. Continues to throb. He has been taking ibuprofen with some relief.   I advised him to come back for revaluation today which he stated he would come back to urgent care.    Please on look out for patient.

## 2017-01-14 NOTE — Telephone Encounter (Signed)
Left message for patient to return call back. Unable to reach patient.

## 2017-01-15 ENCOUNTER — Emergency Department (HOSPITAL_COMMUNITY): Payer: Self-pay

## 2017-01-15 ENCOUNTER — Encounter (HOSPITAL_COMMUNITY): Payer: Self-pay

## 2017-01-15 ENCOUNTER — Emergency Department (HOSPITAL_COMMUNITY)
Admission: EM | Admit: 2017-01-15 | Discharge: 2017-01-16 | Disposition: A | Discharge status: Home / Self Care | Payer: Self-pay | Attending: Emergency Medicine | Admitting: Emergency Medicine

## 2017-01-15 DIAGNOSIS — K852 Alcohol induced acute pancreatitis without necrosis or infection: Secondary | ICD-10-CM

## 2017-01-15 DIAGNOSIS — R112 Nausea with vomiting, unspecified: Secondary | ICD-10-CM

## 2017-01-15 DIAGNOSIS — Z7289 Other problems related to lifestyle: Secondary | ICD-10-CM

## 2017-01-15 DIAGNOSIS — J45909 Unspecified asthma, uncomplicated: Secondary | ICD-10-CM | POA: Insufficient documentation

## 2017-01-15 DIAGNOSIS — R062 Wheezing: Secondary | ICD-10-CM

## 2017-01-15 DIAGNOSIS — F10988 Alcohol use, unspecified with other alcohol-induced disorder: Secondary | ICD-10-CM | POA: Insufficient documentation

## 2017-01-15 DIAGNOSIS — R42 Dizziness and giddiness: Secondary | ICD-10-CM

## 2017-01-15 DIAGNOSIS — Z789 Other specified health status: Secondary | ICD-10-CM

## 2017-01-15 DIAGNOSIS — F17200 Nicotine dependence, unspecified, uncomplicated: Secondary | ICD-10-CM | POA: Insufficient documentation

## 2017-01-15 DIAGNOSIS — R1010 Upper abdominal pain, unspecified: Secondary | ICD-10-CM

## 2017-01-15 DIAGNOSIS — F1090 Alcohol use, unspecified, uncomplicated: Secondary | ICD-10-CM

## 2017-01-15 DIAGNOSIS — R0789 Other chest pain: Secondary | ICD-10-CM

## 2017-01-15 HISTORY — DX: Essential (primary) hypertension: I10

## 2017-01-15 LAB — CBC
HCT: 40.7 % (ref 39.0–52.0)
HEMOGLOBIN: 13.7 g/dL (ref 13.0–17.0)
MCH: 25 pg — ABNORMAL LOW (ref 26.0–34.0)
MCHC: 33.7 g/dL (ref 30.0–36.0)
MCV: 74.3 fL — ABNORMAL LOW (ref 78.0–100.0)
Platelets: 163 10*3/uL (ref 150–400)
RBC: 5.48 MIL/uL (ref 4.22–5.81)
RDW: 14.7 % (ref 11.5–15.5)
WBC: 9.7 10*3/uL (ref 4.0–10.5)

## 2017-01-15 LAB — BASIC METABOLIC PANEL
ANION GAP: 16 — AB (ref 5–15)
BUN: 9 mg/dL (ref 6–20)
CO2: 25 mmol/L (ref 22–32)
Calcium: 10 mg/dL (ref 8.9–10.3)
Chloride: 91 mmol/L — ABNORMAL LOW (ref 101–111)
Creatinine, Ser: 0.96 mg/dL (ref 0.61–1.24)
Glucose, Bld: 82 mg/dL (ref 65–99)
Potassium: 4.1 mmol/L (ref 3.5–5.1)
Sodium: 132 mmol/L — ABNORMAL LOW (ref 135–145)

## 2017-01-15 LAB — I-STAT TROPONIN, ED: Troponin i, poc: 0 ng/mL (ref 0.00–0.08)

## 2017-01-15 LAB — LIPASE, BLOOD: Lipase: 75 U/L — ABNORMAL HIGH (ref 11–51)

## 2017-01-15 MED ORDER — ONDANSETRON HCL 4 MG/2ML IJ SOLN
4.0000 mg | Freq: Once | INTRAMUSCULAR | Status: AC
  Administered 2017-01-16: 4 mg via INTRAVENOUS
  Filled 2017-01-15: qty 2

## 2017-01-15 MED ORDER — MORPHINE SULFATE (PF) 4 MG/ML IV SOLN
4.0000 mg | Freq: Once | INTRAVENOUS | Status: AC
  Administered 2017-01-16: 4 mg via INTRAVENOUS
  Filled 2017-01-15: qty 1

## 2017-01-15 MED ORDER — FAMOTIDINE IN NACL 20-0.9 MG/50ML-% IV SOLN
20.0000 mg | Freq: Once | INTRAVENOUS | Status: AC
  Administered 2017-01-16: 20 mg via INTRAVENOUS
  Filled 2017-01-15: qty 50

## 2017-01-15 MED ORDER — ALBUTEROL SULFATE HFA 108 (90 BASE) MCG/ACT IN AERS
2.0000 | INHALATION_SPRAY | Freq: Once | RESPIRATORY_TRACT | Status: AC
  Administered 2017-01-16: 2 via RESPIRATORY_TRACT
  Filled 2017-01-15: qty 6.7

## 2017-01-15 MED ORDER — SODIUM CHLORIDE 0.9 % IV BOLUS (SEPSIS)
1000.0000 mL | Freq: Once | INTRAVENOUS | Status: AC
  Administered 2017-01-16: 1000 mL via INTRAVENOUS

## 2017-01-15 NOTE — ED Triage Notes (Addendum)
Pt started feeling sick to stomach 9/11 at 0400 after eating greasy sand.  And has been vomiting ever since with abd pain. Pt reports burning in chest worse after vomiting. Chronic pancreatitis hx.  130/90  Hr 74 cbg 85 spo2 98% RA

## 2017-01-15 NOTE — ED Notes (Signed)
Pt notified that urine sample is still needed, pt to bathroom to void

## 2017-01-15 NOTE — Telephone Encounter (Signed)
Will close since he hasn't returned calls or returned to urgent care clinic after I spoke to him

## 2017-01-15 NOTE — ED Provider Notes (Signed)
[facility]-EMERGENCY DEPT Provider Note   CSN: [id number] Arrival date & time: 01/15/17  1900     History   Chief Complaint Chief Complaint  Patient presents with  . Abdominal Pain  . Chest Pain    HPI Riley Kim is a 41 y.o. male with a PMHx of chronic pancreatitis, gallbladder sludge and gravel, asthma, vertigo, HTN, and tobacco user, who presents to the ED with complaints of sudden onset upper abdominal pain radiating into his back and chest that began 2 days ago and has gradually worsened. He states this feels somewhat similar to his prior pancreatitis although more severe, and the radiation into his chest and back is new. He describes the pain as 10/10 constant burning and throbbing epigastric pain radiating into the center of his chest and the upper back, worse with laying flat, eating, or drinking, mildly improved with mustard, and unrelieved with Tylenol PM and Bentyl. Associated symptoms include: 5-6 episodes daily of nonbloody nonbilious emesis with associated nausea, as well as lightheadedness with standing stating that he feels dehydrated. He also reports that he has had some intermittent wheezing due to his asthma, has been using his inhaler with relief however he just ran out today. he admits that he drinks approximately 1 beer daily on average, and tried to "drink the pain away" yesterday by drinking a beer. He has not been able to keep anything down and has not attempted to eat anything since Tuesday, when he ate a steak and cheese sub a few hours prior to onset of symptoms. States he knows he shouldn't eat spicy or greasy foods since this tends to cause his symptoms to flare up, but he wanted the sub so he ate it anyway. That was his last meal; his last sip of water was ~1hr prior to evaluation. He admits to chronic NSAID use. His PCP is the [facility] medicine [facility].   He denies fevers, chills, diaphoresis, SOB, LE swelling, recent travel/surgery/immobilization,  personal/family hx of DVT/PE, claudication, orthopnea, diarrhea/constipation, obstipation, melena, hematochezia, hematemesis, hematuria, dysuria, myalgias, arthralgias, numbness, tingling, focal weakness, or any other complaints at this time. Denies recent travel, sick contacts, or suspicious food intake.   The history is provided by the patient and medical records. No language interpreter was used.  Abdominal Pain   This is a new problem. The current episode started 2 days ago. The problem occurs constantly. The problem has been gradually worsening. The pain is associated with eating and alcohol use. The pain is located in the epigastric region and chest. The quality of the pain is burning and throbbing. The pain is at a severity of 10/10. The pain is severe. Associated symptoms include nausea and vomiting. Pertinent negatives include fever, diarrhea, flatus, hematochezia, melena, constipation, dysuria, hematuria, arthralgias and myalgias. The symptoms are aggravated by certain positions and eating. Relieved by: mustard. Past workup includes ultrasound. His past medical history is significant for gallstones.  Chest Pain   Associated symptoms include abdominal pain, nausea and vomiting. Pertinent negatives include no diaphoresis, no fever, no numbness, no shortness of breath and no weakness.    Past Medical History:  Diagnosis Date  . 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 Arthritis    "right foot, I broke it before"  . Asthma   . Vertigo     Patient Active Problem List   Diagnosis Date Noted  . Pancreatitis 10/14/2014  . Elevated transaminase level   . Elevated bilirubin   .  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 Essential hypertension 12/31/2013  . Alcohol abuse 12/29/2013  . Mild persistent asthma in adult without complication 12/29/2013  . Smoking greater than 20 pack years 12/29/2013    Past Surgical History:  Procedure Laterality Date  .  REPLANTATION THUMB Right 2000       Home Medications    Prior to Admission medications   Medication Sig Start Date End Date Taking? Authorizing Provider  albuterol (PROVENTIL HFA;VENTOLIN HFA) 108 (90 BASE) MCG/ACT inhaler Inhale 2 puffs into the lungs every 6 (six) hours as needed for wheezing or shortness of breath. Patient not taking: Reported on 09/26/2015 07/26/14   Servando Snare, MD  dicyclomine (BENTYL) 20 MG tablet Take 1 tablet (20 mg total) by mouth 2 (two) times daily. 09/19/16   Alvira Monday, MD  ondansetron (ZOFRAN ODT) 4 MG disintegrating tablet Take 1 tablet (4 mg total) by mouth every 8 (eight) hours as needed for nausea or vomiting. 09/19/16   Alvira Monday, MD  oxyCODONE (ROXICODONE) 5 MG immediate release tablet Take 1 tablet (5 mg total) by mouth every 4 (four) hours as needed for severe pain. 09/19/16   Alvira Monday, MD  pantoprazole (PROTONIX) 20 MG tablet Take 1 tablet (20 mg total) by mouth 2 (two) times daily. 09/19/16   Alvira Monday, MD    Family History No family history on file.  Social History Social History  Substance Use Topics  . Smoking status: Current Every Day Smoker    Packs/day: 0.00    Years: 0.00    Types: Cigarettes  . Smokeless tobacco: Never Used     Comment: cutting back  . Alcohol use Yes     Comment: Drink daily.     Allergies   Penicillins and Lisinopril   Review of Systems Review of Systems  Constitutional: Negative for chills, diaphoresis and fever.  Respiratory: Positive for wheezing (intermittent, due to asthma; ran out of inhaler earlier). Negative for shortness of breath.   Cardiovascular: Positive for chest pain. Negative for leg swelling.  Gastrointestinal: Positive for abdominal pain, nausea and vomiting. Negative for blood in stool, constipation, diarrhea, flatus, hematochezia and melena.  Genitourinary: Negative for dysuria and hematuria.  Musculoskeletal: Negative for arthralgias and myalgias.  Skin:  Negative for color change.  Allergic/Immunologic: Negative for immunocompromised state.  Neurological: Positive for light-headedness (with standing). Negative for weakness and numbness.  Psychiatric/Behavioral: Negative for confusion.   All other systems reviewed and are negative for acute change except as noted in the HPI.    Physical Exam Updated Vital Signs BP 112/75 (BP Location: Right Arm)   Pulse (!) 102   Temp 98 F (36.7 C) (Oral)   Resp 18   Ht  (1.854 m)   Wt 99.8 kg (220 lb)   SpO2 100%   BMI 29.03 kg/m   Physical Exam  Constitutional: He is oriented to person, place, and time. Vital signs are normal. He appears well-developed and well-nourished.  Non-toxic appearance. No distress.  Afebrile, nontoxic, NAD  HENT:  Head: Normocephalic and atraumatic.  Mouth/Throat: Oropharynx is clear and moist. Mucous membranes are dry.  Lips slightly dry  Eyes: Conjunctivae and EOM are normal. Right eye exhibits no discharge. Left eye exhibits no discharge.  Neck: Normal range of motion. Neck supple.  Cardiovascular: Normal rate, regular rhythm, normal heart sounds and intact distal pulses.  Exam reveals no gallop and no friction rub.   No murmur heard. Tachycardic in triage which resolved on exam, RRR,  nl s1/s2, no m/r/g, distal pulses intact, no pedal edema   Pulmonary/Chest: Effort normal. No respiratory distress. He has no decreased breath sounds. He has wheezes. He has no rhonchi. He has no rales.  Diffuse expiratory wheezing throughout, no rhonchi/rales, no hypoxia or increased WOB, speaking in full sentences, SpO2 100% on RA   Abdominal: Soft. Normal appearance and bowel sounds are normal. He exhibits no distension. There is tenderness in the right upper quadrant, epigastric area and left upper quadrant. There is guarding (voluntary). There is no rigidity, no rebound, no CVA tenderness, no tenderness at McBurney's point and negative Murphy's sign.  Soft, nondistended, +BS  throughout, with moderate upper abd TTP with voluntary guarding, no rebound/rigidity, neg murphy's (pain elicited but able to fully inspire), neg mcburney's, no CVA TTP   Musculoskeletal: Normal range of motion.  Neurological: He is alert and oriented to person, place, and time. He has normal strength. No sensory deficit.  Skin: Skin is warm, dry and intact. No rash noted.  Psychiatric: He has a normal mood and affect.  Nursing note and vitals reviewed.    ED Treatments / Results  Labs (all labs ordered are listed, but only abnormal results are displayed) Labs Reviewed  BASIC METABOLIC PANEL - Abnormal; Notable for the following:       Result Value   Sodium 132 (*)    Chloride 91 (*)    Anion gap 16 (*)    All other components within normal limits  CBC - Abnormal; Notable for the following:    MCV 74.3 (*)    MCH 25.0 (*)    All other components within normal limits  LIPASE, BLOOD - Abnormal; Notable for the following:    Lipase 75 (*)    All other components within normal limits  HEPATIC FUNCTION PANEL - Abnormal; Notable for the following:    Total Protein 8.3 (*)    AST 46 (*)    ALT 14 (*)    Indirect Bilirubin 1.0 (*)    All other components within normal limits  URINALYSIS, ROUTINE W REFLEX MICROSCOPIC  I-STAT TROPONIN, ED  I-STAT TROPONIN, ED    EKG  EKG Interpretation  Date/Time:  Thursday January 15 2017 19:04:19 EDT Ventricular Rate:  103 PR Interval:  116 QRS Duration: 82 QT Interval:  350 QTC Calculation: 458 R Axis:   88 Text Interpretation:  Sinus tachycardia Right atrial enlargement Borderline ECG Confirmed by Benjiman Core [phone]) on 01/15/2017 11:45:03 PM       Radiology Dg Chest 2 View  Result Date: 01/15/2017 CLINICAL DATA:  Chest pain, shortness of breath, nausea and vomiting for 2 days. EXAM: CHEST  2 VIEW COMPARISON:  None. FINDINGS: The lungs are clear. Heart size is normal. No pneumothorax or pleural fluid. No bony abnormality.  IMPRESSION: Negative chest. Electronically Signed   By: Drusilla Kanner M.D.   On: 01/15/2017 19:56   Ct Abdomen Pelvis W Contrast  Result Date: 01/16/2017 CLINICAL DATA:  Abdominal pain. Nausea and vomiting. Pancreatitis suspected. EXAM: CT ABDOMEN AND PELVIS WITH CONTRAST TECHNIQUE: Multidetector CT imaging of the abdomen and pelvis was performed using the standard protocol following bolus administration of intravenous contrast. CONTRAST:  ISOVUE-300 IOPAMIDOL (ISOVUE-300) INJECTION 61% COMPARISON:  Ultrasound 09/18/2016.  CT 12/29/2013 FINDINGS: Lower chest: No consolidation or pleural fluid. Mild lower lobe bronchial thickening. Hepatobiliary: Diffusely decreased density consistent with steatosis. Mild focal fatty infiltration adjacent with falciform ligament. Probable sludge in the gallbladder without calcified stone. No biliary dilatation.  Pancreas: Mild edema about the head and uncinate process of the pancreas consistent with mild pancreatitis. No ductal dilatation. No peripancreatic fluid collection. No evidence pancreatic necrosis. Spleen: Normal in size without focal abnormality. Adrenals/Urinary Tract: No adrenal nodule. No hydronephrosis or perinephric edema. No focal renal lesion. Urinary bladder is nondistended. No bladder wall thickening. Stomach/Bowel: Stomach physiologically distended. Fluid-filled small bowel without obstruction or bowel inflammation. No bowel wall thickening. Normal appendix. Small to moderate colonic stool burden without colonic wall thickening. There is sigmoid colonic tortuosity. Vascular/Lymphatic: No aortic aneurysm. Circumaortic left renal vein. Splenic and portal veins are patent. No adenopathy. Reproductive: Prostate is unremarkable. Other: No ascites or free air.  No intra-abdominal abscess. Musculoskeletal: There are no acute or suspicious osseous abnormalities. IMPRESSION: 1. Mild uncomplicated acute pancreatitis about the pancreatic head and uncinate  process. No pancreatic necrosis or peripancreatic fluid collection. 2. Hepatic steatosis. Electronically Signed   By: Rubye OaksMelanie  Ehinger M.D.   On: 01/16/2017 00:55    RUQ U/S 09/2016: IMPRESSION: 1. No cholelithiasis or other evidence acute cholecystitis. 2. Echogenic hepatic parenchyma likely indicates hepatic steatosis.  Electronically Signed   By: Deatra RobinsonKevin  Herman M.D.   On: 09/18/2016 23:19   RUQ U/S 09/25/15: IMPRESSION: 1. No acute abnormality seen within the abdomen. 2. Mild sludge and gravel noted within the gallbladder. Gallbladder otherwise unremarkable in appearance. No evidence for obstruction or cholecystitis. 3. Diffuse fatty infiltration within the liver.  Electronically Signed   By: Roanna RaiderJeffery  Chang M.D.   On: 09/25/2015 01:23   Procedures Procedures (including critical care time)  Medications Ordered in ED Medications  HYDROcodone-acetaminophen (NORCO/VICODIN) 5-325 MG per tablet 1 tablet (not administered)  morphine 4 MG/ML injection 4 mg (4 mg Intravenous Given 01/16/17 0014)  albuterol (PROVENTIL HFA;VENTOLIN HFA) 108 (90 Base) MCG/ACT inhaler 2 puff (2 puffs Inhalation Given 01/16/17 0014)  ondansetron (ZOFRAN) injection 4 mg (4 mg Intravenous Given 01/16/17 0014)  famotidine (PEPCID) IVPB 20 mg premix (0 mg Intravenous Stopped 01/16/17 0149)  sodium chloride 0.9 % bolus 1,000 mL (0 mLs Intravenous Stopped 01/16/17 0149)  iopamidol (ISOVUE-300) 61 % injection 100 mL (100 mLs Intravenous Contrast Given 01/16/17 0032)  HYDROmorphone (DILAUDID) injection 1 mg (1 mg Intravenous Given 01/16/17 0155)     Initial Impression / Assessment and Plan / ED Course  I have reviewed the triage vital signs and the nursing notes.  Pertinent labs & imaging results that were available during my care of the patient were reviewed by me and considered in my medical decision making (see chart for details).     41 y.o. male here with upper abd pain x2 days with radiation to chest and  back, associated with n/v, states it feels like his prior pancreatitis. Occurred after eating steak and cheese sub. On exam, diffuse upper abd TTP with voluntary guarding, no rigidity or rebound tenderness, neg murphy's although palpation does elicit pain but able to fully inspire. Diffuse expiratory wheezing, pt states he just ran out of his inhaler, will give him one now. Work up thus far: EKG with sinus tachycardia but similar to prior, no acute ischemic findings. Trop neg. CXR neg. CBC WNL. BMP with chronically low Na and Cl, mildly elevated anion gap at 16 likely from vomiting. Lipase 75 which is not technically acute pancreatitis but could be mild pancreatitis given symptoms; he has had low-level elevations in lipase and still done well with outpatient management. Has not had CT abd/pelv since his original diagnosis in 2015, had abd U/S May  2017 that showed fatty liver and gallbladder sludge and gravel but no cholecystitis; had RUQ U/S in May this year which showed fatty liver but otherwise negative. Story consistent with pancreatitis; will add on LFTs, get repeat troponin, await U/A, and obtain CT abd/pelv to eval for pancreatitis. Will give fluids, pain meds, nausea meds, pepcid, and inhaler. Will reassess shortly.   1:00 AM Repeat trop neg. LFTs with marginally elevated AST 46 which is similar to prior; marginally elevated indirect bili but total bili WNL. U/A with some ketones and protein likely from dehydration, fluids still running; no evidence of UTI or other concerning findings. CT demonstrates mild uncomplicated acute pancreatitis about the head and uncinate process without pancreatic necrosis or fluid collection; hepatic steatosis also seen. Pt feeling slightly better with nausea, but still in pain. Very hesitant to stay in the hospital, wants to go home; will attempt PO challenge with clears, and give dilaudid IV; if pt tolerating PO and pain improved then we can potentially discharge home, but  if pain still intractable or vomiting reoccurs, will need admission. Of note, wheezing improved after inhaler use. Will reassess shortly  2:56 AM Pt feeling better and tolerating PO well, wants pain pill before discharge as he has to wait on his ride and doesn't know if he'll be able to stop at the pharmacy on the way home. Pt does not want to stay in the hospital; r/b/a discussed and pt still wanting to leave and do outpatient management despite discussion on this. Advised clear liquid diet for next 2-3 days, then slowly advance to low-fat diet. Discussed diet/lifestyle modifications to help with symptomatic relief, discussed avoidance of alcohol. Will send home with zofran and pain med rx, and zantac. Rx for inhaler refills given, but has the one from here to go home with; advised proper use of this. Advised f/up with PCP in 2-3 days for recheck of symptoms. Strict return precautions advised. [place] prior to dispensing controlled substance medications, and 1 year search was notable for: oxycodone  #15 tabs on 09/19/16, and percocet 5-325mg  #30 tabs on 02/29/16. Risks/benefits/alternatives and expectations discussed regarding controlled substances. Side effects of medications discussed. Informed consent obtained. I explained the diagnosis and have given explicit precautions to return to the ER including for any other new or worsening symptoms. The patient understands and accepts the medical plan as it's been dictated and I have answered their questions. Discharge instructions concerning home care and prescriptions have been given. The patient is STABLE and is discharged to home in good condition.    Final Clinical Impressions(s) / ED Diagnoses   Final diagnoses:  Upper abdominal pain  Nausea and vomiting in adult patient  Atypical chest pain  Alcohol use  Wheezing  Intermittent lightheadedness  Alcohol-induced acute pancreatitis without infection or necrosis    New  Prescriptions New Prescriptions   ALBUTEROL (PROVENTIL HFA;VENTOLIN HFA) 108 (90 BASE) MCG/ACT INHALER    Inhale 2 puffs into the lungs every 4 (four) hours as needed for wheezing or shortness of breath (cough).   HYDROCODONE-ACETAMINOPHEN (NORCO) 5-325 MG TABLET    Take 1 tablet by mouth every 6 (six) hours as needed for severe pain.   ONDANSETRON (ZOFRAN ODT) 4 MG DISINTEGRATING TABLET    Take 1 tablet (4 mg total) by mouth every 8 (eight) hours as needed for nausea or vomiting.   RANITIDINE (ZANTAC) 150 MG TABLET    Take 1 tablet (150 mg total) by mouth 2 (two) times daily.  [address], [place], [place] 01/16/17 1610    Benjiman Core, MD 01/18/17 [phone]

## 2017-01-16 ENCOUNTER — Emergency Department (HOSPITAL_COMMUNITY): Payer: Self-pay

## 2017-01-16 ENCOUNTER — Encounter (HOSPITAL_COMMUNITY): Payer: Self-pay | Admitting: Radiology

## 2017-01-16 LAB — HEPATIC FUNCTION PANEL
ALT: 14 U/L — ABNORMAL LOW (ref 17–63)
AST: 46 U/L — ABNORMAL HIGH (ref 15–41)
Albumin: 4.4 g/dL (ref 3.5–5.0)
Alkaline Phosphatase: 52 U/L (ref 38–126)
BILIRUBIN DIRECT: 0.2 mg/dL (ref 0.1–0.5)
BILIRUBIN INDIRECT: 1 mg/dL — AB (ref 0.3–0.9)
Total Bilirubin: 1.2 mg/dL (ref 0.3–1.2)
Total Protein: 8.3 g/dL — ABNORMAL HIGH (ref 6.5–8.1)

## 2017-01-16 LAB — URINALYSIS, ROUTINE W REFLEX MICROSCOPIC
Bacteria, UA: NONE SEEN
Glucose, UA: NEGATIVE mg/dL
HGB URINE DIPSTICK: NEGATIVE
KETONES UR: 80 mg/dL — AB
LEUKOCYTES UA: NEGATIVE
Nitrite: NEGATIVE
PROTEIN: 100 mg/dL — AB
SPECIFIC GRAVITY, URINE: 1.041 — AB (ref 1.005–1.030)
pH: 5 (ref 5.0–8.0)

## 2017-01-16 LAB — I-STAT TROPONIN, ED: TROPONIN I, POC: 0 ng/mL (ref 0.00–0.08)

## 2017-01-16 MED ORDER — IOPAMIDOL (ISOVUE-300) INJECTION 61%
100.0000 mL | Freq: Once | INTRAVENOUS | Status: AC | PRN
  Administered 2017-01-16: 100 mL via INTRAVENOUS

## 2017-01-16 MED ORDER — ONDANSETRON 4 MG PO TBDP: 4.0000 mg | ORAL_TABLET | Freq: Three times a day (TID) | ORAL | 0 refills | Status: DC | PRN

## 2017-01-16 MED ORDER — HYDROCODONE-ACETAMINOPHEN 5-325 MG PO TABS
1.0000 | ORAL_TABLET | Freq: Once | ORAL | Status: AC
  Administered 2017-01-16: 1 via ORAL
  Filled 2017-01-16: qty 1

## 2017-01-16 MED ORDER — HYDROMORPHONE HCL 1 MG/ML IJ SOLN
1.0000 mg | Freq: Once | INTRAMUSCULAR | Status: AC
  Administered 2017-01-16: 1 mg via INTRAVENOUS
  Filled 2017-01-16: qty 1

## 2017-01-16 MED ORDER — ALBUTEROL SULFATE HFA 108 (90 BASE) MCG/ACT IN AERS: 2.0000 | INHALATION_SPRAY | RESPIRATORY_TRACT | 0 refills | Status: AC | PRN

## 2017-01-16 MED ORDER — RANITIDINE HCL 150 MG PO TABS: 150.0000 mg | ORAL_TABLET | Freq: Two times a day (BID) | ORAL | 0 refills | Status: DC

## 2017-01-16 MED ORDER — HYDROCODONE-ACETAMINOPHEN 5-325 MG PO TABS: 1.0000 | ORAL_TABLET | Freq: Four times a day (QID) | ORAL | 0 refills | Status: DC | PRN

## 2017-01-16 NOTE — Discharge Instructions (Signed)
Your symptoms are related to pancreatitis. YOU MUST STOP DRINKING ALCOHOL! For the next 2-3 days you'll want to stick to a clear liquid diet in order to allow your pancreas to calm down a bit; after that, if your symptoms are improving, then you can gradually advance your diet to a low-fat diet. You will need to take zantac as directed, and avoid spicy/fatty/acidic foods, avoid soda/coffee/tea/alcohol. Avoid laying down flat within 30 minutes of eating. Avoid NSAIDs like ibuprofen/aleve/motrin/etc on an empty stomach. May consider using over the counter tums/maalox as needed for additional relief. Use zofran as directed as needed for nausea. Use norco as needed for pain but don't drive or operate machinery while taking this medication. Use the albuterol inhaler as needed for wheezing/shortness of breath/chest congestion/etc. Follow up with your regular doctor in 2-3 days for recheck of symptoms. Return to the ER for changes or worsening symptoms.

## 2017-01-16 NOTE — ED Notes (Signed)
Pt was given ginger ale and tolerated this well.  ALert and oriented

## 2017-01-16 NOTE — ED Notes (Signed)
Patient transported to CT 

## 2017-07-28 ENCOUNTER — Encounter (HOSPITAL_COMMUNITY): Payer: Self-pay

## 2017-07-28 ENCOUNTER — Other Ambulatory Visit: Payer: Self-pay

## 2017-07-28 DIAGNOSIS — Z79899 Other long term (current) drug therapy: Secondary | ICD-10-CM | POA: Insufficient documentation

## 2017-07-28 DIAGNOSIS — F17210 Nicotine dependence, cigarettes, uncomplicated: Secondary | ICD-10-CM | POA: Insufficient documentation

## 2017-07-28 DIAGNOSIS — R1013 Epigastric pain: Secondary | ICD-10-CM | POA: Insufficient documentation

## 2017-07-28 DIAGNOSIS — I10 Essential (primary) hypertension: Secondary | ICD-10-CM | POA: Insufficient documentation

## 2017-07-28 DIAGNOSIS — J4530 Mild persistent asthma, uncomplicated: Secondary | ICD-10-CM | POA: Insufficient documentation

## 2017-07-28 LAB — CBC
HEMATOCRIT: 41 % (ref 39.0–52.0)
HEMOGLOBIN: 13.8 g/dL (ref 13.0–17.0)
MCH: 24.9 pg — ABNORMAL LOW (ref 26.0–34.0)
MCHC: 33.7 g/dL (ref 30.0–36.0)
MCV: 74 fL — AB (ref 78.0–100.0)
Platelets: 150 10*3/uL (ref 150–400)
RBC: 5.54 MIL/uL (ref 4.22–5.81)
RDW: 15 % (ref 11.5–15.5)
WBC: 7.3 10*3/uL (ref 4.0–10.5)

## 2017-07-28 LAB — COMPREHENSIVE METABOLIC PANEL
ALBUMIN: 4.5 g/dL (ref 3.5–5.0)
ALT: 32 U/L (ref 17–63)
AST: 57 U/L — AB (ref 15–41)
Alkaline Phosphatase: 59 U/L (ref 38–126)
Anion gap: 10 (ref 5–15)
BUN: 9 mg/dL (ref 6–20)
CO2: 26 mmol/L (ref 22–32)
Calcium: 9.9 mg/dL (ref 8.9–10.3)
Chloride: 100 mmol/L — ABNORMAL LOW (ref 101–111)
Creatinine, Ser: 0.86 mg/dL (ref 0.61–1.24)
GFR calc Af Amer: >60 mL/min (ref >60)
GFR calc non Af Amer: >60 mL/min (ref >60)
GLUCOSE: 118 mg/dL — AB (ref 65–99)
POTASSIUM: 4.5 mmol/L (ref 3.5–5.1)
SODIUM: 136 mmol/L (ref 135–145)
Total Bilirubin: 0.6 mg/dL (ref 0.3–1.2)
Total Protein: 8.1 g/dL (ref 6.5–8.1)

## 2017-07-28 LAB — LIPASE, BLOOD: Lipase: 42 U/L (ref 11–51)

## 2017-07-28 MED ORDER — ONDANSETRON 4 MG PO TBDP
8.0000 mg | ORAL_TABLET | Freq: Once | ORAL | Status: AC
  Administered 2017-07-28: 8 mg via ORAL
  Filled 2017-07-28: qty 2

## 2017-07-28 NOTE — ED Triage Notes (Signed)
Pt endorses pancreatitis flare up since "having a 40 on Friday" N/v and abd pain x 3 days. VSS.

## 2017-07-29 ENCOUNTER — Emergency Department (HOSPITAL_COMMUNITY)
Admission: EM | Admit: 2017-07-29 | Discharge: 2017-07-29 | Disposition: A | Discharge status: Home / Self Care | Payer: Self-pay | Attending: Emergency Medicine | Admitting: Emergency Medicine

## 2017-07-29 DIAGNOSIS — R1013 Epigastric pain: Secondary | ICD-10-CM

## 2017-07-29 MED ORDER — FAMOTIDINE 20 MG PO TABS: 20.0000 mg | ORAL_TABLET | Freq: Two times a day (BID) | ORAL | 0 refills | Status: AC

## 2017-07-29 MED ORDER — SODIUM CHLORIDE 0.9 % IV BOLUS
1000.0000 mL | Freq: Once | INTRAVENOUS | Status: AC
Start: 2017-07-29 — End: 2017-07-29
  Administered 2017-07-29: 1000 mL via INTRAVENOUS

## 2017-07-29 MED ORDER — ONDANSETRON 8 MG PO TBDP: 8.0000 mg | ORAL_TABLET | Freq: Three times a day (TID) | ORAL | 0 refills | Status: DC | PRN

## 2017-07-29 MED ORDER — GI COCKTAIL ~~LOC~~
30.0000 mL | Freq: Once | ORAL | Status: AC
  Administered 2017-07-29: 30 mL via ORAL
  Filled 2017-07-29: qty 30

## 2017-07-29 MED ORDER — SODIUM CHLORIDE 0.9 % IV BOLUS
1000.0000 mL | Freq: Once | INTRAVENOUS | Status: AC
  Administered 2017-07-29: 1000 mL via INTRAVENOUS

## 2017-07-29 MED ORDER — MORPHINE SULFATE (PF) 4 MG/ML IV SOLN
4.0000 mg | Freq: Once | INTRAVENOUS | Status: AC
  Administered 2017-07-29: 4 mg via INTRAVENOUS
  Filled 2017-07-29: qty 1

## 2017-07-29 MED ORDER — ONDANSETRON HCL 4 MG/2ML IJ SOLN
4.0000 mg | Freq: Once | INTRAMUSCULAR | Status: AC
  Administered 2017-07-29: 4 mg via INTRAVENOUS
  Filled 2017-07-29: qty 2

## 2017-07-29 MED ORDER — FAMOTIDINE IN NACL 20-0.9 MG/50ML-% IV SOLN
20.0000 mg | Freq: Once | INTRAVENOUS | Status: AC
Start: 2017-07-29 — End: 2017-07-29
  Administered 2017-07-29: 20 mg via INTRAVENOUS
  Filled 2017-07-29: qty 50

## 2017-07-29 NOTE — Discharge Instructions (Addendum)
Avoid any alcohol.  Stick to clear liquid diet.  Take Pepcid as prescribed.  Zofran as needed for nausea and vomiting.  Follow-up with family doctor.

## 2017-07-29 NOTE — ED Provider Notes (Signed)
[facility] Provider Note   CSN: [id number] Arrival date & time: 07/28/17  2145     History   Chief Complaint Chief Complaint  Patient presents with  . Abdominal Pain  . Pancreatitis    HPI Riley Kim is a 42 y.o. male.  HPI Riley Kim is a 42 y.o. male presents to emergency department complaining of abdominal pain.  Patient states his abdominal pain started 2 days ago.  He reports associated nausea and vomiting.  He states he is able to keep small sips of water down, but unable to eat.  He states pain is epigastric and in the left upper quadrant radiates around the back.  States history of pancreatitis and feels the same.  Here he admits to alcohol drinking 4 days ago but none since then.  He states once his symptoms began, he switched to liquid diet.  patient with several visits to the ED for pancreatitis in the past.  Denies blood in his stool or emesis.  Denies black or tarry stools.  He states he does not drink heavily anymore.  Denies diarrhea.   Past Medical History:  Diagnosis Date  . 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 Arthritis    "right foot, I broke it before"  . Asthma   . Hypertension   . Vertigo     Patient Active Problem List   Diagnosis Date Noted  . Pancreatitis 10/14/2014  . Elevated transaminase level   . Elevated bilirubin   . 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 Essential hypertension 12/31/2013  . Alcohol abuse 12/29/2013  . Mild persistent asthma in adult without complication 12/29/2013  . Smoking greater than 20 pack years 12/29/2013    Past Surgical History:  Procedure Laterality Date  . REPLANTATION THUMB Right 2000        Home Medications    Prior to Admission medications   Medication Sig Start Date End Date Taking? Authorizing Provider  albuterol (PROVENTIL HFA;VENTOLIN HFA) 108 (90 BASE) MCG/ACT inhaler Inhale 2 puffs into the lungs  every 6 (six) hours as needed for wheezing or shortness of breath. Patient not taking: Reported on 09/26/2015 07/26/14   Servando SnareBurns, Alexa R, MD  albuterol (PROVENTIL HFA;VENTOLIN HFA) 108 (90 Base) MCG/ACT inhaler Inhale 2 puffs into the lungs every 4 (four) hours as needed for wheezing or shortness of breath (cough). 01/16/17   Street, Mercedes, PA-C  dicyclomine (BENTYL) 20 MG tablet Take 1 tablet (20 mg total) by mouth 2 (two) times daily. 09/19/16   Alvira MondaySchlossman, Erin, MD  HYDROcodone-acetaminophen (NORCO) 5-325 MG tablet Take 1 tablet by mouth every 6 (six) hours as needed for severe pain. 01/16/17   Street, Mercedes, PA-C  ondansetron (ZOFRAN ODT) 4 MG disintegrating tablet Take 1 tablet (4 mg total) by mouth every 8 (eight) hours as needed for nausea or vomiting. 09/19/16   Alvira MondaySchlossman, Erin, MD  ondansetron (ZOFRAN ODT) 4 MG disintegrating tablet Take 1 tablet (4 mg total) by mouth every 8 (eight) hours as needed for nausea or vomiting. 01/16/17   Street, DoyleMercedes, PA-C  oxyCODONE (ROXICODONE) 5 MG immediate release tablet Take 1 tablet (5 mg total) by mouth every 4 (four) hours as needed for severe pain. 09/19/16   Alvira MondaySchlossman, Erin, MD  pantoprazole (PROTONIX) 20 MG tablet Take 1 tablet (20 mg total) by mouth 2 (two) times daily. 09/19/16   Alvira MondaySchlossman, Erin, MD  ranitidine (ZANTAC) 150 MG tablet Take 1 tablet (  150 mg total) by mouth 2 (two) times daily. 01/16/17   Street, Mercedes, PA-C  lisinopril (PRINIVIL,ZESTRIL) 10 MG tablet Take 1 tablet (10 mg total) by mouth daily. 01/05/14 01/18/14  Ky Barban, MD    Family History History reviewed. No pertinent family history.  Social History Social History   Tobacco Use  . Smoking status: Current Every Day Smoker    Packs/day: 0.00    Years: 0.00    Pack years: 0.00    Types: Cigarettes  . Smokeless tobacco: Never Used  . Tobacco comment: cutting back  Substance Use Topics  . Alcohol use: Yes    Comment: 3-4 times per week  . Drug use: Yes     Types: Marijuana    Comment: "last marijuana was ~ 2012" (12/29/2013)     Allergies   Penicillins and Lisinopril   Review of Systems Review of Systems  Constitutional: Negative for chills and fever.  Respiratory: Negative for cough, chest tightness and shortness of breath.   Cardiovascular: Negative for chest pain, palpitations and leg swelling.  Gastrointestinal: Positive for abdominal pain, nausea and vomiting. Negative for abdominal distention and diarrhea.  Genitourinary: Negative for dysuria, frequency, hematuria and urgency.  Musculoskeletal: Negative for arthralgias, myalgias, neck pain and neck stiffness.  Skin: Negative for rash.  Allergic/Immunologic: Negative for immunocompromised state.  Neurological: Negative for dizziness, weakness, light-headedness, numbness and headaches.  All other systems reviewed and are negative.    Physical Exam Updated Vital Signs BP 124/72 (BP Location: Right Arm)   Pulse 82   Temp 97.7 F (36.5 C) (Oral)   Resp 16   Ht 6\' 1"  (1.854 m)   Wt 99.8 kg (220 lb)   SpO2 98%   BMI 29.03 kg/m   Physical Exam  Constitutional: He appears well-developed and well-nourished. No distress.  HENT:  Head: Normocephalic and atraumatic.  Eyes: Conjunctivae are normal.  Neck: Neck supple.  Cardiovascular: Normal rate, regular rhythm and normal heart sounds.  Pulmonary/Chest: Effort normal. No respiratory distress. He has no wheezes. He has no rales.  Abdominal: Soft. Bowel sounds are normal. He exhibits no distension. There is tenderness. There is no rebound.  Epigastric and left upper quadrant tenderness, no guarding, no rebound tenderness  Musculoskeletal: He exhibits no edema.  Neurological: He is alert.  Skin: Skin is warm and dry.  Nursing note and vitals reviewed.    ED Treatments / Results  Labs (all labs ordered are listed, but only abnormal results are displayed) Labs Reviewed  COMPREHENSIVE METABOLIC PANEL - Abnormal; Notable for  the following components:      Result Value   Chloride 100 (*)    Glucose, Bld 118 (*)    AST 57 (*)    All other components within normal limits  CBC - Abnormal; Notable for the following components:   MCV 74.0 (*)    MCH 24.9 (*)    All other components within normal limits  LIPASE, BLOOD    EKG None  Radiology No results found.  Procedures Procedures (including critical care time)  Medications Ordered in ED Medications  sodium chloride 0.9 % bolus 1,000 mL (has no administration in time range)  sodium chloride 0.9 % bolus 1,000 mL (has no administration in time range)  gi cocktail (Maalox,Lidocaine,Donnatal) (has no administration in time range)  famotidine (PEPCID) IVPB 20 mg premix (has no administration in time range)  morphine 4 MG/ML injection 4 mg (has no administration in time range)  ondansetron (ZOFRAN) injection 4  mg (has no administration in time range)  ondansetron (ZOFRAN-ODT) disintegrating tablet 8 mg (8 mg Oral Given 07/28/17 2230)     Initial Impression / Assessment and Plan / ED Course  I have reviewed the triage vital signs and the nursing notes.  Pertinent labs & imaging results that were available during my care of the patient were reviewed by me and considered in my medical decision making (see chart for details).     Pt with recurrent pancreatitis after drinking 4 days ago. Symptoms for two days. Similar to prior paincreatitis. No hematemesis or hematochezia. Epigastric and LUQ tednerness.  Abdomen is soft, no rigidity, no peritoneal signs.  Vital signs normal.  No concern for free air or surgical abdomen.  Will give IV fluids, pain medications, Pepcid.  Will try GI cocktail and reassessj   7:06 AM Patient's pain practically resolved.  He is drinking water with no emesis.  His lipase is normal.  Will discharge home.  Still potentially could be acute on chronic pancreatitis, advised to not drink alcohol and stick with clear liquid diet for now.   Will give prescription for Zofran and Pepcid.  Follow-up with primary care doctor.  Vitals:   07/28/17 2223 07/28/17 2225 07/29/17 0430  BP: 105/77  124/72  Pulse: 98  82  Resp: 16  16  Temp: 97.7 F (36.5 C)    TempSrc: Oral    SpO2: 99%  98%  Weight:  99.8 kg (220 lb)   Height:  6\' 1"  (1.854 m)      Final Clinical Impressions(s) / ED Diagnoses   Final diagnoses:  Epigastric pain    ED Discharge Orders    None       Jaynie Crumble, PA-C 07/29/17 1610    Ward, Layla Maw, DO 07/30/17 [phone]

## 2017-09-11 IMAGING — US US ABDOMEN COMPLETE
1 series · 13 of 25 positions shown · non-contrast
Comparison: CT of the abdomen and pelvis from 12/29/2013

CLINICAL DATA: Acute onset of right upper quadrant abdominal pain
and tenderness. Elevated LFTs and lipase. Nausea and vomiting.
Initial encounter.

EXAM:
ABDOMEN ULTRASOUND COMPLETE

[Series 1: us abdomen complete · 0.25mm/px · 13 of 123 slices shown]
[im 1/123]
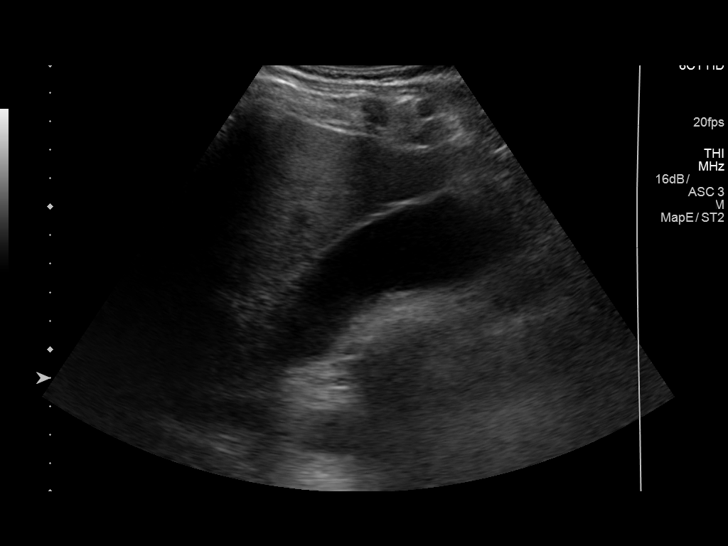
[im 11/123]
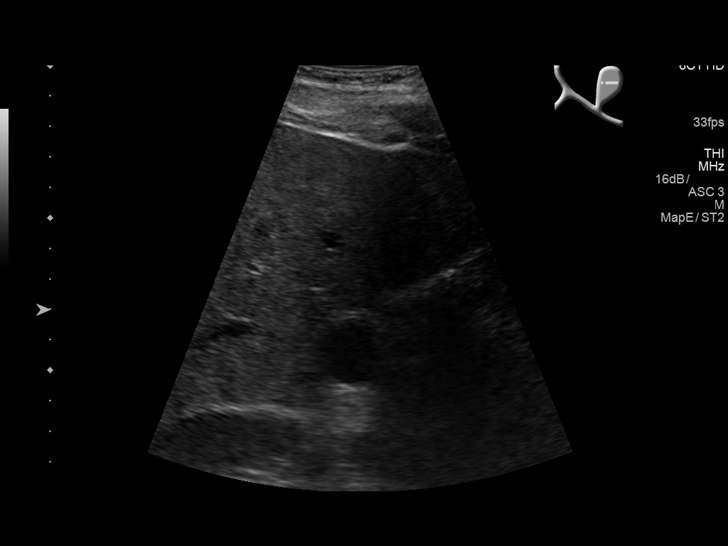
[im 21/123]
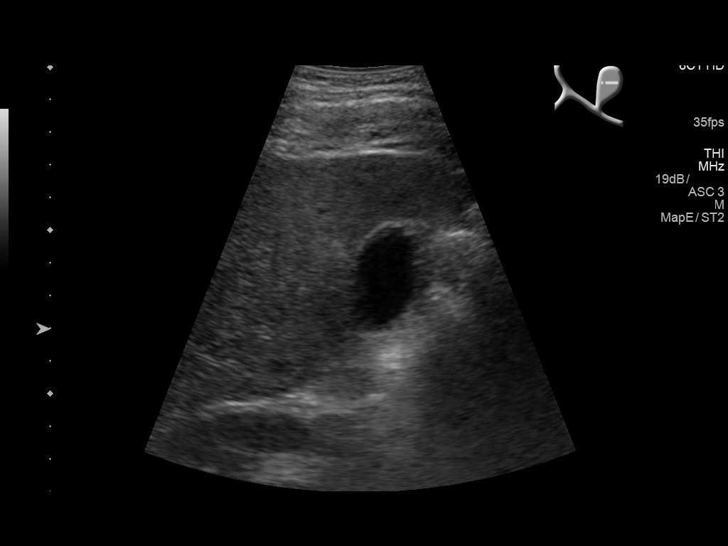
[im 31/123]
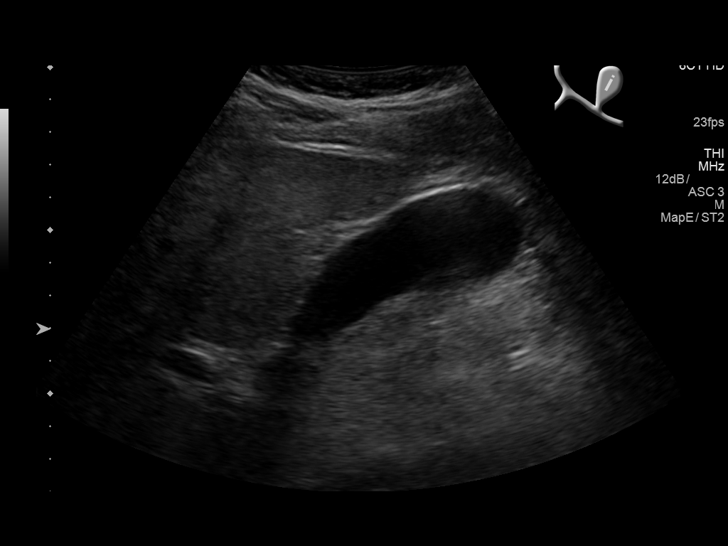
[im 41/123]
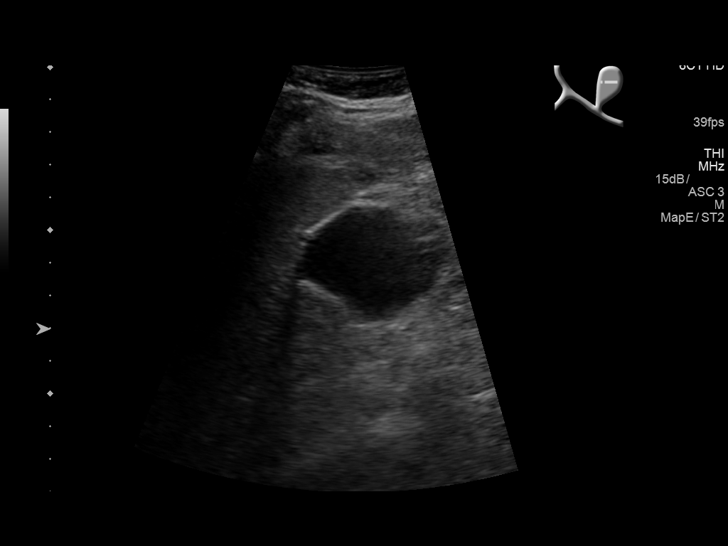
[im 51/123]
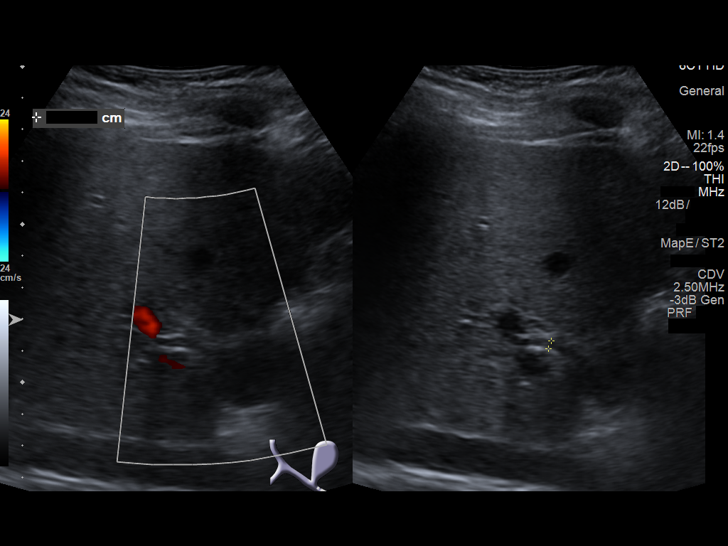
[im 62/123]
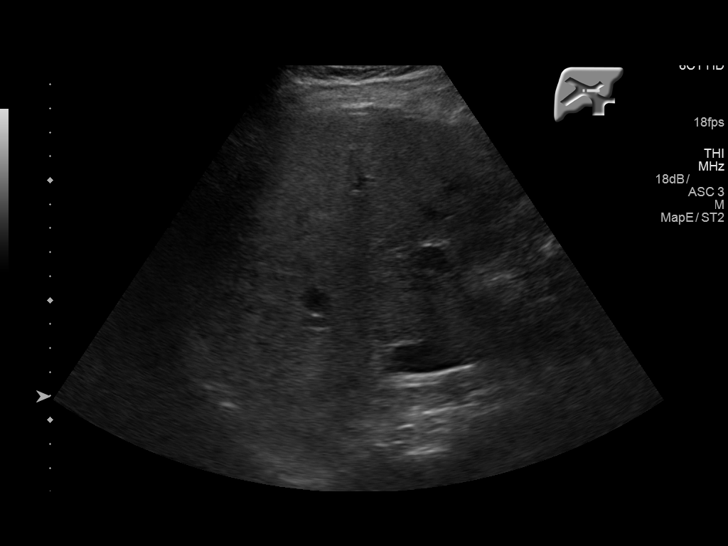
[im 72/123]
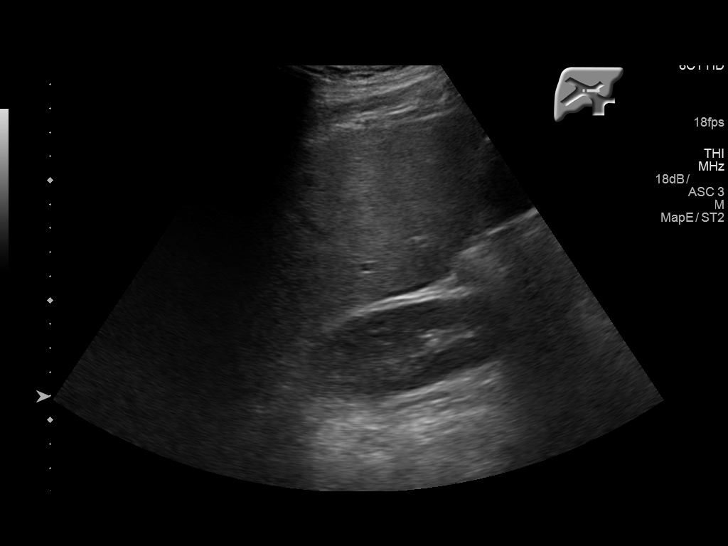
[im 82/123]
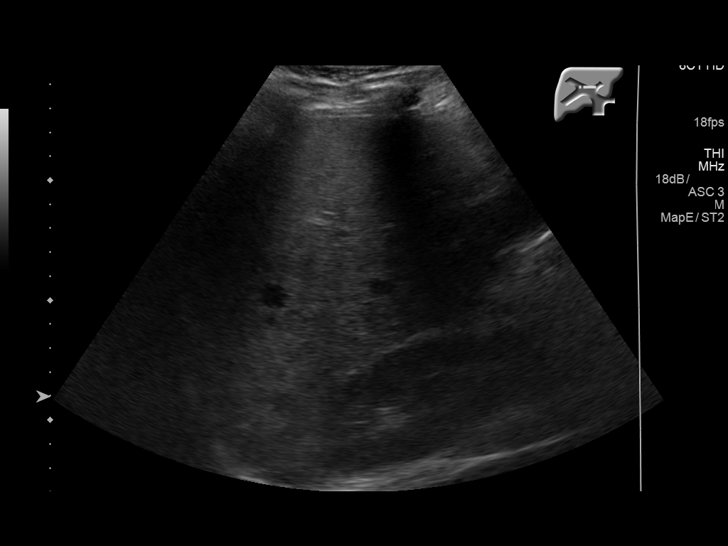
[im 92/123]
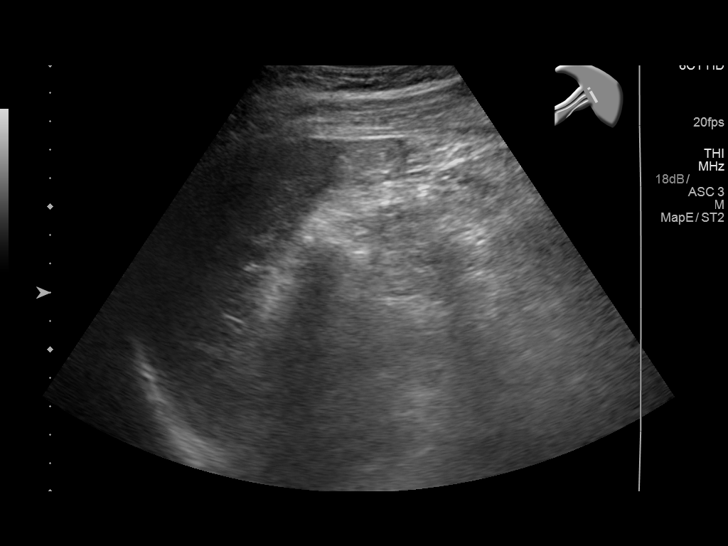
[im 102/123]
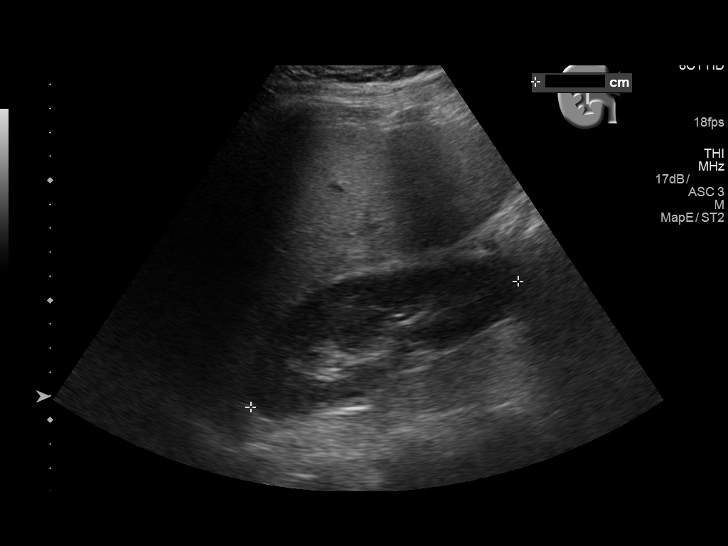
[im 112/123]
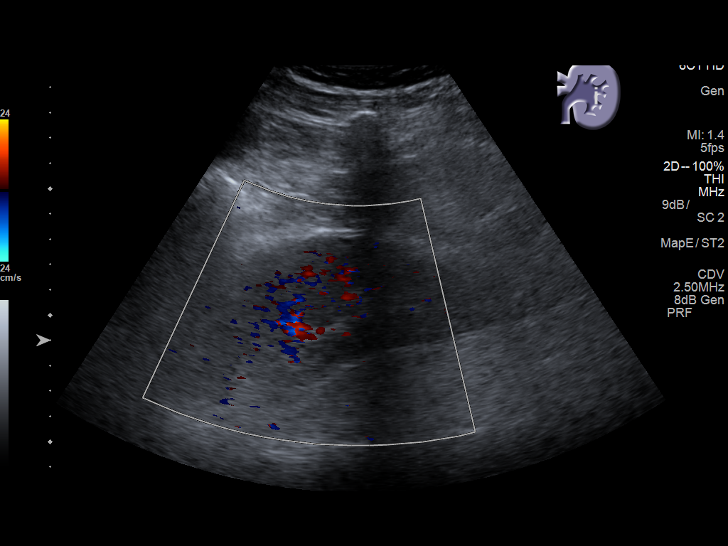
[im 123/123]
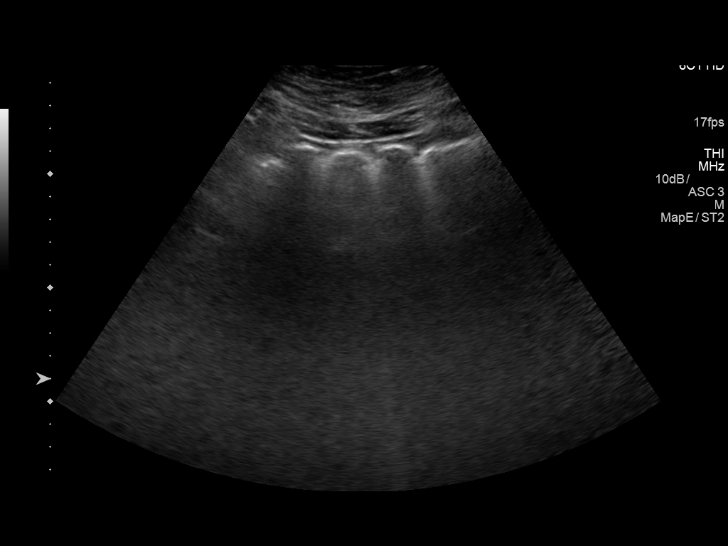

[13 of 25 positions shown; findings below may reference images not displayed]

FINDINGS: Gallbladder: Sludge and gravel are noted within the gallbladder. The
gallbladder is otherwise unremarkable in appearance. No gallbladder
wall thickening or pericholecystic fluid is seen. No
ultrasonographic Murphy's sign is elicited.

Common bile duct: Diameter: 0.3 cm, within normal limits in caliber.

Liver: No focal lesion identified. Increased parenchymal
echogenicity and coarsened echotexture, compatible with fatty
infiltration.

IVC: No abnormality visualized.

Pancreas: Not well characterized due to overlying bowel gas.

Spleen: Size and appearance within normal limits.

Right Kidney: Length: 12.3 cm. Echogenicity within normal limits. No
mass or hydronephrosis visualized.

Left Kidney: Length: 11.9 cm. Echogenicity within normal limits. No
mass or hydronephrosis visualized.

Abdominal aorta: No aneurysm visualized. Not well characterized
distally due to overlying bowel gas.

Other findings: None.
IMPRESSION: 1. No acute abnormality seen within the abdomen.
2. Mild sludge and gravel noted within the gallbladder. Gallbladder
otherwise unremarkable in appearance. No evidence for obstruction or
cholecystitis.
3. Diffuse fatty infiltration within the liver.

## 2018-09-28 ENCOUNTER — Emergency Department (HOSPITAL_COMMUNITY): Payer: Self-pay

## 2018-09-28 ENCOUNTER — Emergency Department (HOSPITAL_COMMUNITY)
Admission: EM | Admit: 2018-09-28 | Discharge: 2018-09-28 | Disposition: A | Discharge status: Home / Self Care | Payer: Self-pay | Attending: Emergency Medicine | Admitting: Emergency Medicine

## 2018-09-28 ENCOUNTER — Other Ambulatory Visit: Payer: Self-pay

## 2018-09-28 DIAGNOSIS — F17210 Nicotine dependence, cigarettes, uncomplicated: Secondary | ICD-10-CM | POA: Insufficient documentation

## 2018-09-28 DIAGNOSIS — Z79899 Other long term (current) drug therapy: Secondary | ICD-10-CM | POA: Insufficient documentation

## 2018-09-28 DIAGNOSIS — J45909 Unspecified asthma, uncomplicated: Secondary | ICD-10-CM | POA: Insufficient documentation

## 2018-09-28 DIAGNOSIS — I10 Essential (primary) hypertension: Secondary | ICD-10-CM | POA: Insufficient documentation

## 2018-09-28 DIAGNOSIS — X58XXXA Exposure to other specified factors, initial encounter: Secondary | ICD-10-CM | POA: Insufficient documentation

## 2018-09-28 DIAGNOSIS — Y939 Activity, unspecified: Secondary | ICD-10-CM | POA: Insufficient documentation

## 2018-09-28 DIAGNOSIS — S92515A Nondisplaced fracture of proximal phalanx of left lesser toe(s), initial encounter for closed fracture: Secondary | ICD-10-CM | POA: Insufficient documentation

## 2018-09-28 DIAGNOSIS — Y999 Unspecified external cause status: Secondary | ICD-10-CM | POA: Insufficient documentation

## 2018-09-28 DIAGNOSIS — Y929 Unspecified place or not applicable: Secondary | ICD-10-CM | POA: Insufficient documentation

## 2018-09-28 NOTE — ED Triage Notes (Signed)
Pt c/o foot injury x 2 weeks ago and states that foot pain is not getting better, only worse.

## 2018-09-28 NOTE — ED Provider Notes (Signed)
[facility] EMERGENCY DEPARTMENT Provider Note   CSN: [id number] Arrival date & time: 09/28/18  2108    History   Chief Complaint Chief Complaint  Patient presents with  . Foot Injury    HPI Riley Kim is a 43 y.o. male.     Patient states he injured his left foot about 2 weeks ago. He has not done anything to treat his pain. Pain and swelling have continued and increased since the time of the injury.  The history is provided by the patient. No language interpreter was used.  Foot Injury  Location:  Foot Foot location:  L foot Pain details:    Quality:  Aching   Radiates to:  L leg   Severity:  Moderate   Onset quality:  Sudden   Duration:  2 weeks   Timing:  Constant   Progression:  Worsening Chronicity:  New Foreign body present:  No foreign bodies Prior injury to area:  No Ineffective treatments:  None tried Associated symptoms: swelling     Past Medical History:  Diagnosis Date  . 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 Arthritis    "right foot, I broke it before"  . Asthma   . Hypertension   . Vertigo     Patient Active Problem List   Diagnosis Date Noted  . Pancreatitis 10/14/2014  . Elevated transaminase level   . Elevated bilirubin   . 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 Essential hypertension 12/31/2013  . Alcohol abuse 12/29/2013  . Mild persistent asthma in adult without complication 12/29/2013  . Smoking greater than 20 pack years 12/29/2013    Past Surgical History:  Procedure Laterality Date  . REPLANTATION THUMB Right 2000        Home Medications    Prior to Admission medications   Medication Sig Start Date End Date Taking? Authorizing Provider  albuterol (PROVENTIL HFA;VENTOLIN HFA) 108 (90 BASE) MCG/ACT inhaler Inhale 2 puffs into the lungs every 6 (six) hours as needed for wheezing or shortness of breath. Patient not taking: Reported on 09/26/2015 07/26/14    Servando Snare, MD  albuterol (PROVENTIL HFA;VENTOLIN HFA) 108 (90 Base) MCG/ACT inhaler Inhale 2 puffs into the lungs every 4 (four) hours as needed for wheezing or shortness of breath (cough). Patient not taking: Reported on 07/29/2017 01/16/17   Street, [place], PA-C  dicyclomine (BENTYL) 20 MG tablet Take 1 tablet (20 mg total) by mouth 2 (two) times daily. Patient not taking: Reported on 07/29/2017 09/19/16   Alvira Monday, MD  famotidine (PEPCID) 20 MG tablet Take 1 tablet (20 mg total) by mouth 2 (two) times daily. 07/29/17   Kirichenko, Lemont Fillers, PA-C  HYDROcodone-acetaminophen (NORCO) 5-325 MG tablet Take 1 tablet by mouth every 6 (six) hours as needed for severe pain. Patient not taking: Reported on 07/29/2017 01/16/17   Street, Teller, PA-C  ibuprofen (ADVIL,MOTRIN) 200 MG tablet Take 400 mg by mouth every 6 (six) hours as needed for mild pain.    [provider]  ondansetron (ZOFRAN ODT) 8 MG disintegrating tablet Take 1 tablet (8 mg total) by mouth every 8 (eight) hours as needed for nausea or vomiting. 07/29/17   Kirichenko, Lemont Fillers, PA-C  oxyCODONE (ROXICODONE) 5 MG immediate release tablet Take 1 tablet (5 mg total) by mouth every 4 (four) hours as needed for severe pain. Patient not taking: Reported on 07/29/2017 09/19/16   Alvira Monday, MD  pantoprazole (PROTONIX) 20 MG tablet Take  1 tablet (20 mg total) by mouth 2 (two) times daily. Patient not taking: Reported on 07/29/2017 09/19/16   Alvira MondaySchlossman, Erin, MD  ranitidine (ZANTAC) 150 MG tablet Take 1 tablet (150 mg total) by mouth 2 (two) times daily. Patient not taking: Reported on 07/29/2017 01/16/17   Street, GallantMercedes, PA-C    Family History No family history on file.  Social History Social History   Tobacco Use  . Smoking status: Current Every Day Smoker    Packs/day: 0.00    Years: 0.00    Pack years: 0.00    Types: Cigarettes  . Smokeless tobacco: Never Used  . Tobacco comment: cutting back  Substance Use  Topics  . Alcohol use: Yes    Comment: 3-4 times per week  . Drug use: Yes    Types: Marijuana    Comment: "last marijuana was ~ 2012" (12/29/2013)     Allergies   Penicillins and Lisinopril   Review of Systems Review of Systems  Musculoskeletal: Positive for arthralgias.  All other systems reviewed and are negative.    Physical Exam Updated Vital Signs BP 113/68 (BP Location: Left Arm)   Pulse (!) 101   Temp 98.3 F (36.8 C) (Oral)   Resp 20   Ht 6\' 2"  (1.88 m)   Wt 90.7 kg   SpO2 98%   BMI 25.68 kg/m   Physical Exam Vitals signs and nursing note reviewed.  Constitutional:      Appearance: Normal appearance.  HENT:     Head: Atraumatic.  Eyes:     Conjunctiva/sclera: Conjunctivae normal.  Cardiovascular:     Rate and Rhythm: Normal rate.  Pulmonary:     Effort: Pulmonary effort is normal.  Abdominal:     Palpations: Abdomen is soft.  Musculoskeletal:        General: Swelling and tenderness present.  Skin:    General: Skin is warm and dry.  Neurological:     Mental Status: He is alert and oriented to person, place, and time.  Psychiatric:        Mood and Affect: Mood normal.      ED Treatments / Results  Labs (all labs ordered are listed, but only abnormal results are displayed) Labs Reviewed - No data to display  EKG None  Radiology Dg Foot Complete Left  Result Date: 09/28/2018 CLINICAL DATA:  Pain following recent trauma EXAM: LEFT FOOT - COMPLETE 3+ VIEW COMPARISON:  June 26, 2003 FINDINGS: Frontal, oblique, and lateral views obtained. There is a fracture of the proximal metaphysis of fifth proximal phalanx with subtle impaction in this area. No other fracture is evident. No dislocation. There is slight hallux valgus deformity at the first MTP joint. There is no appreciable joint space narrowing or erosion. IMPRESSION: Fracture of the proximal aspect of the fifth proximal phalanx with slight impaction at the fracture site. No other  fracture. No dislocation. Slight hallux valgus deformity at the first MTP joint. No appreciable joint space narrowing or erosion. Electronically Signed   By: Bretta BangWilliam  Woodruff III M.D.   On: 09/28/2018 21:56    Procedures Procedures (including critical care time)  Medications Ordered in ED Medications - No data to display   Initial Impression / Assessment and Plan / ED Course  I have reviewed the triage vital signs and the nursing notes.  Pertinent labs & imaging results that were available during my care of the patient were reviewed by me and considered in my medical decision making (see chart for  details).        Patient X-Ray positive for proximal fifth phalanx fracture of left foot. Pt advised to follow up with orthopedics. Patient given ace wrap, post op shoe, and crutched while in ED, conservative therapy recommended and discussed. Patient will be discharged home & is agreeable with above plan. Returns precautions discussed. Pt appears safe for discharge.  Final Clinical Impressions(s) / ED Diagnoses   Final diagnoses:  Closed nondisplaced fracture of proximal phalanx of lesser toe of left foot, initial encounter    ED Discharge Orders    None       Felicie Morn, NP 09/28/18 2231    Terrilee Files, MD 09/29/18 [phone]

## 2018-09-28 NOTE — Discharge Instructions (Addendum)
Ibuprofen or tylenol for pain. Limit weight bearing. Use crutches as instructed. Follow up with orthopedics.

## 2019-03-05 ENCOUNTER — Encounter (HOSPITAL_COMMUNITY): Payer: Self-pay | Admitting: Emergency Medicine

## 2019-03-05 ENCOUNTER — Emergency Department (HOSPITAL_COMMUNITY)
Admission: EM | Admit: 2019-03-05 | Discharge: 2019-03-05 | Disposition: A | Discharge status: Home / Self Care | Payer: Self-pay | Attending: Emergency Medicine | Admitting: Emergency Medicine

## 2019-03-05 ENCOUNTER — Other Ambulatory Visit: Payer: Self-pay

## 2019-03-05 ENCOUNTER — Emergency Department (HOSPITAL_COMMUNITY): Payer: Self-pay

## 2019-03-05 DIAGNOSIS — Z79899 Other long term (current) drug therapy: Secondary | ICD-10-CM | POA: Insufficient documentation

## 2019-03-05 DIAGNOSIS — Y92480 Sidewalk as the place of occurrence of the external cause: Secondary | ICD-10-CM | POA: Insufficient documentation

## 2019-03-05 DIAGNOSIS — Y939 Activity, unspecified: Secondary | ICD-10-CM | POA: Insufficient documentation

## 2019-03-05 DIAGNOSIS — W010XXA Fall on same level from slipping, tripping and stumbling without subsequent striking against object, initial encounter: Secondary | ICD-10-CM | POA: Insufficient documentation

## 2019-03-05 DIAGNOSIS — F17210 Nicotine dependence, cigarettes, uncomplicated: Secondary | ICD-10-CM | POA: Insufficient documentation

## 2019-03-05 DIAGNOSIS — S0083XA Contusion of other part of head, initial encounter: Secondary | ICD-10-CM

## 2019-03-05 DIAGNOSIS — Y999 Unspecified external cause status: Secondary | ICD-10-CM | POA: Insufficient documentation

## 2019-03-05 DIAGNOSIS — I10 Essential (primary) hypertension: Secondary | ICD-10-CM | POA: Insufficient documentation

## 2019-03-05 MED ORDER — OXYCODONE HCL 5 MG PO TABS
5.0000 mg | ORAL_TABLET | Freq: Once | ORAL | Status: AC
  Administered 2019-03-05: 5 mg via ORAL
  Filled 2019-03-05: qty 1

## 2019-03-05 MED ORDER — OXYMETAZOLINE HCL 0.05 % NA SOLN
1.0000 | Freq: Once | NASAL | Status: AC
  Administered 2019-03-05: 1 via NASAL
  Filled 2019-03-05: qty 30

## 2019-03-05 MED ORDER — ACETAMINOPHEN 500 MG PO TABS
1000.0000 mg | ORAL_TABLET | Freq: Once | ORAL | Status: AC
  Administered 2019-03-05: 1000 mg via ORAL
  Filled 2019-03-05: qty 2

## 2019-03-05 NOTE — ED Triage Notes (Addendum)
Pt was walking to work and was on his phone when he tripped over a branch and landed on concrete.  Abrasion to nose and upper lip.  Also reports bilateral eye pain.  States he hasn't slept in 3 days due to "drama".  Reports + ETOH.    Denies LOC.  PT ambulatory to ED.  [employer] (employer) per pt's request to let them know he was at hospital.

## 2019-03-05 NOTE — Discharge Instructions (Signed)
It was my pleasure taking care of you today!  Triple antibiotic ointment to the nose twice a day.   There are several techniques you can use to prevent or stop nosebleeds in the future. Keep your nose moist either with saline spray several times a day or by applying a thin layer of Neosporin, bacitracin or other antibiotic ointment to the inside of your nose once or twice a day.  If the bleeding starts again, gently blow your nose into a tissue to clear the blood and clots, then apply 1-2 sprays to each affected nostril of over-the-counter Afrin nasal spray (oxymetazoline). Then squeeze your nose shut tightly and DO NOT PEEK for at least 15 minutes. This will resolve most nosebleeds.  If you continue to have trouble after trying these techniques, or anything seems out of the ordinary or concerns you, please return to the Emergency Department.

## 2019-03-05 NOTE — ED Notes (Signed)
Patient transported to CT 

## 2019-03-05 NOTE — ED Provider Notes (Signed)
[name] EMERGENCY DEPARTMENT Provider Note   CSN: [id number] Arrival date & time: 03/05/19  0973     History   Chief Complaint Chief Complaint  Patient presents with   Fall   Facial Injury    HPI Riley Kim is a 43 y.o. male.     The history is provided by the patient and medical records. No language interpreter was used.  Fall Associated symptoms include headaches.  Facial Injury Associated symptoms: epistaxis, headaches and neck pain    Riley Kim is a 43 y.o. male  with a PMH as listed below who presents to the Emergency Department for a fall patient after fall just prior to arrival. Patient states that he was texting while walking on his phone when he tripped over a large branch, falling face forward on the pavement. Reports neck pain, headache and facial pain.  Nose has been bleeding since the incident from both naris.  Reports swelling to the top lip as well. No medications prior to arrival for symptoms. Tetanus is up-to-date. No LOC.   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 Authorizing Provider  albuterol (PROVENTIL HFA;VENTOLIN HFA) 108 (90 BASE) MCG/ACT inhaler Inhale 2 puffs into the lungs every 6 (six) hours as  needed for wheezing or shortness of breath. Patient not taking: Reported on 09/26/2015 07/26/14   Servando Snare, MD  albuterol (PROVENTIL HFA;VENTOLIN HFA) 108 (90 Base) MCG/ACT inhaler Inhale 2 puffs into the lungs every 4 (four) hours as needed for wheezing or shortness of breath (cough). Patient not taking: Reported on 07/29/2017 01/16/17   Street, [place], PA-C  famotidine (PEPCID) 20 MG tablet Take 1 tablet (20 mg total) by mouth 2 (two) times daily. 07/29/17   Kirichenko, Tatyana, PA-C  ibuprofen (ADVIL,MOTRIN) 200 MG tablet Take 400 mg by mouth every 6 (six) hours as needed for mild pain.    [provider]  pantoprazole (PROTONIX) 20 MG tablet Take 1 tablet (20 mg total) by mouth 2 (two) times daily. Patient not taking: Reported on 07/29/2017 09/19/16   Alvira Monday, MD    Family History No family history on file.  Social History Social History   Tobacco Use   Smoking status: Current Every Day Smoker    Packs/day: 0.00    Years: 0.00    Pack years: 0.00    Types: Cigarettes   Smokeless tobacco: Never Used   Tobacco comment: cutting back  Substance Use Topics   Alcohol use: Yes    Comment: 3-4 times per week   Drug use: Yes    Types: Marijuana    Comment: "last marijuana was ~ 2012" (12/29/2013)     Allergies  Penicillins and Lisinopril   Review of Systems Review of Systems  HENT: Positive for facial swelling and nosebleeds.   Musculoskeletal: Positive for neck pain.  Neurological: Positive for headaches. Negative for dizziness, syncope, weakness and numbness.  All other systems reviewed and are negative.    Physical Exam Updated Vital Signs BP (!) 145/94    Pulse 89    Temp 98.1 F (36.7 C) (Oral)    Resp 15    SpO2 97%   Physical Exam Vitals signs and nursing note reviewed.  Constitutional:      General: He is not in acute distress.    Appearance: He is well-developed.  HENT:     Head: Normocephalic.     Comments: Left upper lip  swelling. No loose dentition. No oral lacerations. No septal hematoma appreciated, but does have active epistaxis from bilateral nares.  Neck:     Musculoskeletal: Neck supple.     Comments: Midline and right paraspinal tenderness.  Cardiovascular:     Rate and Rhythm: Normal rate and regular rhythm.     Heart sounds: Normal heart sounds. No murmur.  Pulmonary:     Effort: Pulmonary effort is normal. No respiratory distress.     Breath sounds: Normal breath sounds.  Abdominal:     General: There is no distension.     Palpations: Abdomen is soft.     Tenderness: There is no abdominal tenderness.  Skin:    General: Skin is warm and dry.  Neurological:     Mental Status: He is alert and oriented to person, place, and time.     Comments: Speech clear and goal oriented. CN 2-12 grossly intact. No drift. Strength and sensation intact.      ED Treatments / Results  Labs (all labs ordered are listed, but only abnormal results are displayed) Labs Reviewed - No data to display  EKG None  Radiology Ct Head Wo Contrast  Result Date: 03/05/2019 CLINICAL DATA:  Fall, neck pain, abrasion to nose and upper lip, bilateral eye pain. ETOH. EXAM: CT HEAD WITHOUT CONTRAST CT MAXILLOFACIAL WITHOUT CONTRAST CT CERVICAL SPINE WITHOUT CONTRAST TECHNIQUE: Multidetector CT imaging of the head, cervical spine, and maxillofacial structures were performed using the standard protocol without intravenous contrast. Multiplanar CT image reconstructions of the cervical spine and maxillofacial structures were also generated. COMPARISON:  None. FINDINGS: CT HEAD FINDINGS Brain: Ventricles are normal in size and configuration. There is no mass, hemorrhage, edema or other evidence of acute parenchymal abnormality. No extra-axial hemorrhage. Vascular: No hyperdense vessel or unexpected calcification. Skull: Normal. Negative for fracture or focal lesion. Other: None. CT MAXILLOFACIAL FINDINGS Osseous: Lower frontal bones  are intact and normally aligned. No displaced nasal bone fracture seen. Osseous structures about the orbits are intact and normally aligned bilaterally. Walls of the paranasal sinuses are intact and normally aligned. Bilateral zygomatic arches and pterygoid plates are intact and normally aligned. No mandible fracture or displacement seen. Orbits: Negative. No traumatic or inflammatory finding. Sinuses: Clear. Soft tissues: Soft tissue swelling/edema of the upper lip. No circumscribed fluid collection or soft tissue hematoma identified. No underlying fracture or tooth dislodgement seen. CT CERVICAL SPINE FINDINGS Alignment: Levoscoliosis which may be accentuated by patient positioning. No evidence of acute vertebral body subluxation. Skull base and vertebrae: No fracture line or displaced fracture fragment seen. Facet joints are normally aligned. Soft tissues and spinal canal: No prevertebral fluid or swelling. No visible canal hematoma. Disc levels: Mild degenerative spondylosis of the mid and lower  cervical spine. No significant central canal stenosis at any level. Upper chest: Negative. Other: None IMPRESSION: 1. Negative head CT. No intracranial hemorrhage or edema. No skull fracture. 2. Soft tissue swelling/edema of the upper lip. No underlying fracture or tooth dislodgement seen. 3. No facial bone fracture or dislocation seen. 4. No fracture or acute subluxation within the cervical spine. Mild degenerative change within the mid and lower cervical spine. Electronically Signed   By: Franki Cabot M.D.   On: 03/05/2019 11:08   Ct Cervical Spine Wo Contrast  Result Date: 03/05/2019 CLINICAL DATA:  Fall, neck pain, abrasion to nose and upper lip, bilateral eye pain. ETOH. EXAM: CT HEAD WITHOUT CONTRAST CT MAXILLOFACIAL WITHOUT CONTRAST CT CERVICAL SPINE WITHOUT CONTRAST TECHNIQUE: Multidetector CT imaging of the head, cervical spine, and maxillofacial structures were performed using the standard protocol  without intravenous contrast. Multiplanar CT image reconstructions of the cervical spine and maxillofacial structures were also generated. COMPARISON:  None. FINDINGS: CT HEAD FINDINGS Brain: Ventricles are normal in size and configuration. There is no mass, hemorrhage, edema or other evidence of acute parenchymal abnormality. No extra-axial hemorrhage. Vascular: No hyperdense vessel or unexpected calcification. Skull: Normal. Negative for fracture or focal lesion. Other: None. CT MAXILLOFACIAL FINDINGS Osseous: Lower frontal bones are intact and normally aligned. No displaced nasal bone fracture seen. Osseous structures about the orbits are intact and normally aligned bilaterally. Walls of the paranasal sinuses are intact and normally aligned. Bilateral zygomatic arches and pterygoid plates are intact and normally aligned. No mandible fracture or displacement seen. Orbits: Negative. No traumatic or inflammatory finding. Sinuses: Clear. Soft tissues: Soft tissue swelling/edema of the upper lip. No circumscribed fluid collection or soft tissue hematoma identified. No underlying fracture or tooth dislodgement seen. CT CERVICAL SPINE FINDINGS Alignment: Levoscoliosis which may be accentuated by patient positioning. No evidence of acute vertebral body subluxation. Skull base and vertebrae: No fracture line or displaced fracture fragment seen. Facet joints are normally aligned. Soft tissues and spinal canal: No prevertebral fluid or swelling. No visible canal hematoma. Disc levels: Mild degenerative spondylosis of the mid and lower cervical spine. No significant central canal stenosis at any level. Upper chest: Negative. Other: None IMPRESSION: 1. Negative head CT. No intracranial hemorrhage or edema. No skull fracture. 2. Soft tissue swelling/edema of the upper lip. No underlying fracture or tooth dislodgement seen. 3. No facial bone fracture or dislocation seen. 4. No fracture or acute subluxation within the cervical  spine. Mild degenerative change within the mid and lower cervical spine. Electronically Signed   By: Franki Cabot M.D.   On: 03/05/2019 11:08   Ct Maxillofacial Wo Contrast  Result Date: 03/05/2019 CLINICAL DATA:  Fall, neck pain, abrasion to nose and upper lip, bilateral eye pain. ETOH. EXAM: CT HEAD WITHOUT CONTRAST CT MAXILLOFACIAL WITHOUT CONTRAST CT CERVICAL SPINE WITHOUT CONTRAST TECHNIQUE: Multidetector CT imaging of the head, cervical spine, and maxillofacial structures were performed using the standard protocol without intravenous contrast. Multiplanar CT image reconstructions of the cervical spine and maxillofacial structures were also generated. COMPARISON:  None. FINDINGS: CT HEAD FINDINGS Brain: Ventricles are normal in size and configuration. There is no mass, hemorrhage, edema or other evidence of acute parenchymal abnormality. No extra-axial hemorrhage. Vascular: No hyperdense vessel or unexpected calcification. Skull: Normal. Negative for fracture or focal lesion. Other: None. CT MAXILLOFACIAL FINDINGS Osseous: Lower frontal bones are intact and normally aligned. No displaced nasal bone fracture seen. Osseous structures about the orbits are intact and normally aligned bilaterally. Walls  of the paranasal sinuses are intact and normally aligned. Bilateral zygomatic arches and pterygoid plates are intact and normally aligned. No mandible fracture or displacement seen. Orbits: Negative. No traumatic or inflammatory finding. Sinuses: Clear. Soft tissues: Soft tissue swelling/edema of the upper lip. No circumscribed fluid collection or soft tissue hematoma identified. No underlying fracture or tooth dislodgement seen. CT CERVICAL SPINE FINDINGS Alignment: Levoscoliosis which may be accentuated by patient positioning. No evidence of acute vertebral body subluxation. Skull base and vertebrae: No fracture line or displaced fracture fragment seen. Facet joints are normally aligned. Soft tissues and  spinal canal: No prevertebral fluid or swelling. No visible canal hematoma. Disc levels: Mild degenerative spondylosis of the mid and lower cervical spine. No significant central canal stenosis at any level. Upper chest: Negative. Other: None IMPRESSION: 1. Negative head CT. No intracranial hemorrhage or edema. No skull fracture. 2. Soft tissue swelling/edema of the upper lip. No underlying fracture or tooth dislodgement seen. 3. No facial bone fracture or dislocation seen. 4. No fracture or acute subluxation within the cervical spine. Mild degenerative change within the mid and lower cervical spine. Electronically Signed   By: Bary RichardStan  Maynard M.D.   On: 03/05/2019 11:08    Procedures Procedures (including critical care time)  Medications Ordered in ED Medications  acetaminophen (TYLENOL) tablet 1,000 mg (1,000 mg Oral Given 03/05/19 1001)  oxyCODONE (Oxy IR/ROXICODONE) immediate release tablet 5 mg (5 mg Oral Given 03/05/19 1001)  oxymetazoline (AFRIN) 0.05 % nasal spray 1 spray (1 spray Each Nare Given 03/05/19 1023)     Initial Impression / Assessment and Plan / ED Course  I have reviewed the triage vital signs and the nursing notes.  Pertinent labs & imaging results that were available during my care of the patient were reviewed by me and considered in my medical decision making (see chart for details).       Riley Kim is a 43 y.o. male who presents to ED for evaluation after mechanical fall just prior to arrival.  Bilateral epistaxis without septal hematoma.  Has midline tenderness in the neck as well.  No focal neurologic deficits.  Given aspirin and nosebleed did resolve.  CT shows soft tissue swelling and edema of the upper lip, but no other acute abnormalities.  Counseled on home care instructions.  PCP follow-up if no improvement.  Reasons to return to ER discussed and all questions answered.   Final Clinical Impressions(s) / ED Diagnoses   Final diagnoses:  Contusion of  face, initial encounter    ED Discharge Orders    None       Krystol Rocco, Chase PicketJaime Pilcher, PA-C 03/05/19 1201    Margarita Grizzleay, Danielle, MD 03/05/19 1600

## 2019-09-05 ENCOUNTER — Other Ambulatory Visit: Payer: Self-pay

## 2019-09-05 ENCOUNTER — Emergency Department (HOSPITAL_COMMUNITY)
Admission: EM | Admit: 2019-09-05 | Discharge: 2019-09-05 | Disposition: A | Discharge status: Home / Self Care | Payer: Self-pay | Attending: Emergency Medicine | Admitting: Emergency Medicine

## 2019-09-05 ENCOUNTER — Emergency Department (HOSPITAL_COMMUNITY): Payer: Self-pay

## 2019-09-05 ENCOUNTER — Encounter (HOSPITAL_COMMUNITY): Payer: Self-pay

## 2019-09-05 DIAGNOSIS — Y92410 Unspecified street and highway as the place of occurrence of the external cause: Secondary | ICD-10-CM | POA: Insufficient documentation

## 2019-09-05 DIAGNOSIS — Z79899 Other long term (current) drug therapy: Secondary | ICD-10-CM | POA: Diagnosis not present

## 2019-09-05 DIAGNOSIS — Y999 Unspecified external cause status: Secondary | ICD-10-CM | POA: Diagnosis not present

## 2019-09-05 DIAGNOSIS — Y93I9 Activity, other involving external motion: Secondary | ICD-10-CM | POA: Diagnosis not present

## 2019-09-05 DIAGNOSIS — S20212A Contusion of left front wall of thorax, initial encounter: Secondary | ICD-10-CM | POA: Insufficient documentation

## 2019-09-05 DIAGNOSIS — I10 Essential (primary) hypertension: Secondary | ICD-10-CM | POA: Insufficient documentation

## 2019-09-05 DIAGNOSIS — J45909 Unspecified asthma, uncomplicated: Secondary | ICD-10-CM | POA: Insufficient documentation

## 2019-09-05 DIAGNOSIS — F17210 Nicotine dependence, cigarettes, uncomplicated: Secondary | ICD-10-CM | POA: Insufficient documentation

## 2019-09-05 DIAGNOSIS — S299XXA Unspecified injury of thorax, initial encounter: Secondary | ICD-10-CM | POA: Diagnosis present

## 2019-09-05 MED ORDER — METHOCARBAMOL 500 MG PO TABS: 500.0000 mg | ORAL_TABLET | Freq: Two times a day (BID) | ORAL | 0 refills | Status: AC
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
## 2020-02-11 ENCOUNTER — Ambulatory Visit (HOSPITAL_COMMUNITY)
Admission: EM | Admit: 2020-02-11 | Discharge: 2020-02-12 | Disposition: A | Discharge status: Home / Self Care | Payer: No Payment, Other | Attending: Nurse Practitioner | Admitting: Nurse Practitioner

## 2020-02-11 ENCOUNTER — Encounter (HOSPITAL_COMMUNITY): Payer: Self-pay

## 2020-02-11 ENCOUNTER — Other Ambulatory Visit: Payer: Self-pay

## 2020-02-11 DIAGNOSIS — R45851 Suicidal ideations: Secondary | ICD-10-CM | POA: Diagnosis not present

## 2020-02-11 DIAGNOSIS — M1990 Unspecified osteoarthritis, unspecified site: Secondary | ICD-10-CM | POA: Insufficient documentation

## 2020-02-11 DIAGNOSIS — F1024 Alcohol dependence with alcohol-induced mood disorder: Secondary | ICD-10-CM | POA: Diagnosis not present

## 2020-02-11 DIAGNOSIS — F17210 Nicotine dependence, cigarettes, uncomplicated: Secondary | ICD-10-CM | POA: Diagnosis not present

## 2020-02-11 DIAGNOSIS — F1014 Alcohol abuse with alcohol-induced mood disorder: Secondary | ICD-10-CM

## 2020-02-11 DIAGNOSIS — Z20822 Contact with and (suspected) exposure to covid-19: Secondary | ICD-10-CM | POA: Insufficient documentation

## 2020-02-11 LAB — POCT URINE DRUG SCREEN - MANUAL ENTRY (I-SCREEN)
POC Amphetamine UR: NOT DETECTED
POC Buprenorphine (BUP): NOT DETECTED
POC Cocaine UR: NOT DETECTED
POC Marijuana UR: NOT DETECTED
POC Methadone UR: NOT DETECTED
POC Methamphetamine UR: NOT DETECTED
POC Morphine: NOT DETECTED
POC Oxazepam (BZO): NOT DETECTED
POC Oxycodone UR: NOT DETECTED
POC Secobarbital (BAR): NOT DETECTED

## 2020-02-11 LAB — POC SARS CORONAVIRUS 2 AG -  ED: SARS Coronavirus 2 Ag: NEGATIVE

## 2020-02-11 MED ORDER — THIAMINE HCL 100 MG PO TABS
100.0000 mg | ORAL_TABLET | Freq: Every day | ORAL | Status: DC
  Administered 2020-02-12: 100 mg via ORAL
  Filled 2020-02-11: qty 1

## 2020-02-11 MED ORDER — ADULT MULTIVITAMIN W/MINERALS CH
1.0000 | ORAL_TABLET | Freq: Every day | ORAL | Status: DC
  Administered 2020-02-11 – 2020-02-12 (×2): 1 via ORAL
  Filled 2020-02-11 (×2): qty 1

## 2020-02-11 MED ORDER — GABAPENTIN 600 MG PO TABS: 300.0000 mg | ORAL_TABLET | Freq: Three times a day (TID) | ORAL | Status: DC

## 2020-02-11 MED ORDER — LORAZEPAM 1 MG PO TABS
1.0000 mg | ORAL_TABLET | Freq: Four times a day (QID) | ORAL | Status: DC | PRN
  Administered 2020-02-12 (×2): 1 mg via ORAL
  Filled 2020-02-11 (×2): qty 1

## 2020-02-11 MED ORDER — GABAPENTIN 300 MG PO CAPS
300.0000 mg | ORAL_CAPSULE | Freq: Three times a day (TID) | ORAL | Status: DC
  Administered 2020-02-11 – 2020-02-12 (×2): 300 mg via ORAL
  Filled 2020-02-11: qty 1
  Filled 2020-02-11: qty 21
  Filled 2020-02-11: qty 1

## 2020-02-11 MED ORDER — NICOTINE 21 MG/24HR TD PT24
21.0000 mg | MEDICATED_PATCH | Freq: Every day | TRANSDERMAL | Status: DC
  Administered 2020-02-11 – 2020-02-12 (×2): 21 mg via TRANSDERMAL
  Filled 2020-02-11 (×2): qty 1

## 2020-02-11 MED ORDER — ACETAMINOPHEN 325 MG PO TABS: 650.0000 mg | ORAL_TABLET | Freq: Four times a day (QID) | ORAL | Status: DC | PRN

## 2020-02-11 MED ORDER — MAGNESIUM HYDROXIDE 400 MG/5ML PO SUSP: 30.0000 mL | Freq: Every day | ORAL | Status: DC | PRN

## 2020-02-11 MED ORDER — LOPERAMIDE HCL 2 MG PO CAPS: 2.0000 mg | ORAL_CAPSULE | ORAL | Status: DC | PRN

## 2020-02-11 MED ORDER — TRAZODONE HCL 50 MG PO TABS
50.0000 mg | ORAL_TABLET | Freq: Every evening | ORAL | Status: DC | PRN
  Administered 2020-02-11: 50 mg via ORAL
  Filled 2020-02-11: qty 1

## 2020-02-11 MED ORDER — ALUM & MAG HYDROXIDE-SIMETH 200-200-20 MG/5ML PO SUSP
30.0000 mL | ORAL | Status: DC | PRN
  Filled 2020-02-11: qty 30

## 2020-02-11 MED ORDER — IBUPROFEN 600 MG PO TABS
600.0000 mg | ORAL_TABLET | Freq: Four times a day (QID) | ORAL | Status: DC | PRN
  Administered 2020-02-12: 600 mg via ORAL
  Filled 2020-02-11: qty 1

## 2020-02-11 MED ORDER — HYDROXYZINE HCL 25 MG PO TABS
25.0000 mg | ORAL_TABLET | Freq: Four times a day (QID) | ORAL | Status: DC | PRN
  Administered 2020-02-11: 25 mg via ORAL
  Filled 2020-02-11: qty 1

## 2020-02-11 MED ORDER — ONDANSETRON 4 MG PO TBDP: 4.0000 mg | ORAL_TABLET | Freq: Four times a day (QID) | ORAL | Status: DC | PRN
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
## 2020-02-11 NOTE — BH Assessment (Signed)
Comprehensive Clinical Assessment (CCA) Screening, Triage and Referral Note  02/11/2020 Riley Kim [id number]    Riley Kim is a 44 year old male who presents to [facility] via [facility] voluntarily for alcohol intoxication and depression. Pt states that he is severely depressed and drinks alcohol dailt. Pt states that he drank 5 40oz beers today and drinks between 3 to 4 daily. Pt currently endorses suicidal thoughts, states he thought about jumping off a bridge earlier today, reports no prior SI attempts. Pt also reports passive HI thoughts towards his ex wife but no plan to hurt her, he states that although he and his wife are not together she does help him when he needs her. Pt denies AVH and SIB but states he does have vivid dreams Pt  Reports mental health history in his family (depression ) as well as substance abuse. Pt reports current stressors: he states he is homeless, has been last 5 years also states he has lost family members last few years, grieving still. Pt reports he does work full time. Pt reports no current provider, no psych meds, states he sleeps 2 hours daily and eats 1 meal daily. Pt reports current depressive symptoms: worthlessness, haplessness, tearfulness, irritability. Pt currently presents casually dressed, depressed mood and affect, is clearly intoxicated. Pt states he is fine with staying tonight for observation, seeking therapy as well.         Diagnosis: MDD, severe w/o psychosis         Alcohol Use Disorder, severe Disposition: Riley Conn, FNP recommends pt for overnight observation/continous assessment   Visit Diagnosis: Depression and Alcoholism  Patient Reported Information How did you hear about [place]? [employer]  Referral name: [employer]  Referral phone number: No data recorded Whom do you see for routine medical problems? I don't have a doctor   Practice/Facility Name: NONE  Practice/Facility Phone Number: No data recorded  Name of Contact: No data  recorded  Contact Number: No data recorded  Contact Fax Number: No data recorded  Prescriber Name: No data recorded  Prescriber Address (if known): No data recorded What Is the Reason for Your Visit/Call Today? No data recorded How Long Has This Been Causing You Problems? <Week  Have You Recently Been in Any Inpatient Treatment (Hospital/Detox/Crisis Center/28-Day Program)? No   Name/Location of Program/Hospital:No data recorded  How Long Were You There? No data recorded  When Were You Discharged? No data recorded Have You Ever Received Services From [facility] Before? No   Who Do You See at [facility]? No data recorded Have You Recently Had Any Thoughts About Hurting Yourself? Yes   Are You Planning to Commit Suicide/Harm Yourself At This time?  Yes  Have you Recently Had Thoughts About Hurting Someone Riley Kim? Yes   Explanation: Homicidal towards ex-wife Have You Used Any Alcohol or Drugs in the Past 24 Hours? Yes   How Long Ago Did You Use Drugs or Alcohol?  0400   What Did You Use and How Much? 5 40 ounce beers What Do You Feel Would Help You the Most Today? Assessment Only  Do You Currently Have a Therapist/Psychiatrist? No   Name of Therapist/Psychiatrist: No data recorded  Have You Been Recently Discharged From Any Office Practice or Programs? No   Explanation of Discharge From Practice/Program:  No data recorded    CCA Screening Triage Referral Assessment Type of Contact: Face-to-Face   Is this Initial or Reassessment? Iniitial  Date Telepsych consult ordered in [facility]:  02/11/20  Time Telepsych consult ordered in [facility]:  No data recorded Patient Reported Information Reviewed? Yes   Patient Left Without Being Seen? No data recordedNO  Reason for Not Completing Assessment: No data recorded Collateral Involvement: none (no)  Does Patient Have a Automotive engineer Guardian? No data recorded  Name and Contact of Legal Guardian:  No data recorded If Minor and Not  Living with Parent(s), Who has Custody? No data recorded Is CPS involved or ever been involved? Never  Is APS involved or ever been involved? Never  Patient Determined To Be At Risk for Harm To Self or Others Based on Review of Patient Reported Information or Presenting Complaint? No data recorded  Method: No data recorded  Availability of Means: No data recorded  Intent: No data recorded  Notification Required: No data recorded  Additional Information for Danger to Others Potential:  No data recorded  Additional Comments for Danger to Others Potential:  No data recorded  Are There Guns or Other Weapons in Your Home?  No data recorded   Types of Guns/Weapons: No data recorded   Are These Weapons Safely Secured?                              No data recorded   Who Could Verify You Are Able To Have These Secured:    No data recorded Do You Have any Outstanding Charges, Pending Court Dates, Parole/Probation? No data recorded Contacted To Inform of Risk of Harm To Self or Others: No data recorded Location of Assessment: [facility]  Does Patient Present under Involuntary Commitment? No   IVC Papers Initial File Date: No data recorded  [place] of Residence: [place]  Patient Currently Receiving the Following Services: Not Receiving Services   Determination of Need: Urgent (48 hours)   Options For Referral: Other: Comment   Riley Kim, LCSWA

## 2020-02-11 NOTE — ED Provider Notes (Signed)
Behavioral Health Admission H&P [facility] & [facility])  Date: 02/12/20 Patient Name: Riley Kim MRN: [id number] Chief Complaint:  Chief Complaint  Patient presents with  . Alcohol Problem  . Depression      Diagnoses:  Final diagnoses:  Alcohol abuse with alcohol-induced mood disorder (HCC)    HPI: [name] is a 44 y.o. male with a history of alcohol abuse and alcohol induced pancreatitis who presents voluntarily with law enforcement to [facility] due to depression and suicidal ideations with plans to jump off of a bridge. He reports that he has been feeling depressed the past 1.5 weeks. States that his estranged wife has encouraged him to get help and that she is only person in his life that is supportive. States that he and his wife do not live together. States that he has been homeless off and on for the past 4 to 5 years. Patient reports a long history of alcohol use. Reports that he usually drinks (3) 40 once beers daily. States that today he drank (5) 40 ounce beers. He denies a history of alcohol withdrawal and states that he goes 2-3 days at time without drinking and does not experience any withdrawal symptoms. He denies a psychiatric history other than alcohol use.   On evaluation patient is alert and oriented x 4, pleasant, and cooperative. Speech is clear and coherent. Mood is depressed and affect is congruent with mood. Thought process is coherent and thought content is logical. Denies auditory and visual hallucinations. No indication that patient is responding to internal stimuli. No evidence of delusional thought content. Reports suicidal ideations with plans to jump off a bridge. Reports homicidal ideations toward his wife. Denies any homicidal intent or plan. Denies substance use other than alcohol.    PHQ 2-9:     ED from 02/11/2020 in [facility]  C-SSRS RISK CATEGORY Moderate Risk       Total Time spent with patient: 30 minutes  Musculoskeletal   Strength & Muscle Tone: within normal limits Gait & Station: normal Patient leans: N/A  Psychiatric Specialty Exam  Presentation General Appearance: Casual;Fairly Groomed  Eye Contact:Good  Speech:Clear and Coherent;Normal Rate  Speech Volume:Normal  Handedness:Right   Mood and Affect  Mood:Anxious;Depressed;Hopeless  Affect:Congruent;Depressed   Thought Process  Thought Processes:Coherent  Descriptions of Associations:Intact  Orientation:Full (Time, Place and Person)  Thought Content:Logical  Hallucinations:Hallucinations: None  Ideas of Reference:None  Suicidal Thoughts:Suicidal Thoughts: Yes, Active SI Active Intent and/or Plan: With Intent;With Plan;With Means to Carry Out  Homicidal Thoughts:Homicidal Thoughts: Yes, Passive HI Passive Intent and/or Plan: Without Intent;Without Plan   Sensorium  Memory:Immediate Good;Recent Good;Remote Good  Judgment:Fair  Insight:Fair   Executive Functions  Concentration:Fair  Attention Span:Fair  Recall:Fair  Fund of Knowledge:Good  Language:Good   Psychomotor Activity  Psychomotor Activity:Psychomotor Activity: Normal   Assets  Assets:Communication Skills;Desire for Improvement;Physical Health   Sleep  Sleep:Sleep: Poor   Physical Exam Constitutional:      General: He is not in acute distress.    Appearance: He is not ill-appearing, toxic-appearing or diaphoretic.  HENT:     Head: Normocephalic.     Right Ear: External ear normal.     Left Ear: External ear normal.  Eyes:     Pupils: Pupils are equal, round, and reactive to light.  Cardiovascular:     Rate and Rhythm: Normal rate.  Pulmonary:     Effort: Pulmonary effort is normal. No respiratory distress.  Musculoskeletal:  General: Normal range of motion.  Skin:    General: Skin is warm and dry.  Neurological:     Mental Status: He is alert and oriented to person, place, and time.  Psychiatric:        Mood and Affect:  Mood is depressed.        Speech: Speech normal.        Behavior: Behavior is cooperative.        Thought Content: Thought content is not paranoid or delusional. Thought content includes homicidal and suicidal ideation. Thought content includes suicidal plan. Thought content does not include homicidal plan.    Review of Systems  Constitutional: Negative for chills, diaphoresis, fever, malaise/fatigue and weight loss.  HENT: Negative for congestion.   Respiratory: Negative for cough and shortness of breath.   Cardiovascular: Negative for chest pain and palpitations.  Gastrointestinal: Negative for diarrhea, nausea and vomiting.  Neurological: Negative for dizziness and seizures.  Psychiatric/Behavioral: Positive for depression, substance abuse and suicidal ideas. Negative for hallucinations and memory loss. The patient is nervous/anxious and has insomnia.   All other systems reviewed and are negative.   Blood pressure 127/79, temperature (!) 97.3 F (36.3 C), resp. rate 20, height 6\' 1"  (1.854 m), weight 90.7 kg, SpO2 99 %. Body mass index is 26.39 kg/m.  Past Psychiatric History: Alcohol Use Disorder   Is the patient at risk to self? Yes  Has the patient been a risk to self in the past 6 months? No .    Has the patient been a risk to self within the distant past? No   Is the patient a risk to others? No   Has the patient been a risk to others in the past 6 months? No   Has the patient been a risk to others within the distant past? No   Past Medical History:  Past Medical History:  Diagnosis Date  . 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 Arthritis    "right foot, I broke it before"  . Asthma   . Hypertension   . Vertigo     Past Surgical History:  Procedure Laterality Date  . REPLANTATION THUMB Right 2000    Family History: History reviewed. No pertinent family history.  Social History:  Social History   Socioeconomic History   . Marital status: Legally Separated    Spouse name: Not on file  . Number of children: Not on file  . Years of education: Not on file  . Highest education level: Not on file  Occupational History    Employer: [employer]  Tobacco Use  . Smoking status: Current Every Day Smoker    Packs/day: 0.00    Years: 0.00    Pack years: 0.00    Types: Cigarettes  . Smokeless tobacco: Never Used  . Tobacco comment: cutting back  Substance and Sexual Activity  . Alcohol use: Yes    Comment: 3-4 times per week  . Drug use: Yes    Types: Marijuana    Comment: "last marijuana was ~ 2012" (12/29/2013)  . Sexual activity: Not on file  Other Topics Concern  . Not on file  Social History Narrative  . Not on file   Social Determinants of Health   Financial Resource Strain:   . Difficulty of Paying Living Expenses: Not on file  Food Insecurity:   . Worried About Programme researcher, broadcasting/film/videounning Out of Food in the Last Year: Not on file  .  Ran Out of Food in the Last Year: Not on file  Transportation Needs:   . Lack of Transportation (Medical): Not on file  . Lack of Transportation (Non-Medical): Not on file  Physical Activity:   . Days of Exercise per Week: Not on file  . Minutes of Exercise per Session: Not on file  Stress:   . Feeling of Stress : Not on file  Social Connections:   . Frequency of Communication with Friends and Family: Not on file  . Frequency of Social Gatherings with Friends and Family: Not on file  . Attends Religious Services: Not on file  . Active Member of Clubs or Organizations: Not on file  . Attends Banker Meetings: Not on file  . Marital Status: Not on file  Intimate Partner Violence:   . Fear of Current or Ex-Partner: Not on file  . Emotionally Abused: Not on file  . Physically Abused: Not on file  . Sexually Abused: Not on file    SDOH:  SDOH Screenings   Alcohol Screen:   . Last Alcohol Screening Score (AUDIT): Not on file  Depression (PHQ2-9):   . PHQ-2  Score: Not on file  Financial Resource Strain:   . Difficulty of Paying Living Expenses: Not on file  Food Insecurity:   . Worried About Programme researcher, broadcasting/film/video in the Last Year: Not on file  . Ran Out of Food in the Last Year: Not on file  Housing:   . Last Housing Risk Score: Not on file  Physical Activity:   . Days of Exercise per Week: Not on file  . Minutes of Exercise per Session: Not on file  Social Connections:   . Frequency of Communication with Friends and Family: Not on file  . Frequency of Social Gatherings with Friends and Family: Not on file  . Attends Religious Services: Not on file  . Active Member of Clubs or Organizations: Not on file  . Attends Banker Meetings: Not on file  . Marital Status: Not on file  Stress:   . Feeling of Stress : Not on file  Tobacco Use: High Risk  . Smoking Tobacco Use: Current Every Day Smoker  . Smokeless Tobacco Use: Never Used  Transportation Needs:   . Freight forwarder (Medical): Not on file  . Lack of Transportation (Non-Medical): Not on file    Last Labs:  Admission on 02/11/2020  Component Date Value Ref Range Status  . SARS Coronavirus 2 Ag 02/11/2020 Negative  Negative Preliminary  . POC Amphetamine UR 02/11/2020 None Detected  None Detected Final  . POC Secobarbital (BAR) 02/11/2020 None Detected  None Detected Final  . POC Buprenorphine (BUP) 02/11/2020 None Detected  None Detected Final  . POC Oxazepam (BZO) 02/11/2020 None Detected  None Detected Final  . POC Cocaine UR 02/11/2020 None Detected  None Detected Final  . POC Methamphetamine UR 02/11/2020 None Detected  None Detected Final  . POC Morphine 02/11/2020 None Detected  None Detected Final  . POC Oxycodone UR 02/11/2020 None Detected  None Detected Final  . POC Methadone UR 02/11/2020 None Detected  None Detected Final  . POC Marijuana UR 02/11/2020 None Detected  None Detected Final    Allergies: Penicillins and Lisinopril  PTA Medications:  (Not in a hospital admission)   Medical Decision Making  Admission lab orders placed  Initiate CIWA protocol with Ativan 1 mg every 6 hours prn for CIWA >10  Start gabapentin 300 mg TID  for alcohol withdrawal Start Thiamine 100 mg daily for nutritional supplementation Start MVI daily for nutritional supplementation    Clinical Course as of Feb 11 213  Sat Feb 11, 2020  2304 UDS negative  POCT Urine Drug Screen - ([facility]) [JB]    Clinical Course User Index [JB] Jackelyn Poling, NP    Recommendations  Based on my evaluation the patient does not appear to have an emergency medical condition.   Patient will be placed in the continuous assessment area at [facility] for treatment and stabilization. He will be reevaluated on 02/11/2020. The treatment team will determine disposition at that time.      Jackelyn Poling, NP 02/12/20  2:14 AM

## 2020-02-11 NOTE — ED Notes (Signed)
Patient has phone, Consulting civil engineer, wallet and other items in locker 1.

## 2020-02-12 LAB — COMPREHENSIVE METABOLIC PANEL
ALT: 38 U/L (ref 0–44)
AST: 112 U/L — ABNORMAL HIGH (ref 15–41)
Albumin: 3.6 g/dL (ref 3.5–5.0)
Alkaline Phosphatase: 61 U/L (ref 38–126)
Anion gap: 14 (ref 5–15)
BUN: 5 mg/dL — ABNORMAL LOW (ref 6–20)
CO2: 23 mmol/L (ref 22–32)
Calcium: 9.1 mg/dL (ref 8.9–10.3)
Chloride: 100 mmol/L (ref 98–111)
Creatinine, Ser: 0.65 mg/dL (ref 0.61–1.24)
GFR, Estimated: >60 mL/min (ref >60)
Glucose, Bld: 77 mg/dL (ref 70–99)
Potassium: 4.1 mmol/L (ref 3.5–5.1)
Sodium: 137 mmol/L (ref 135–145)
Total Bilirubin: 0.5 mg/dL (ref 0.3–1.2)
Total Protein: 6.8 g/dL (ref 6.5–8.1)

## 2020-02-12 LAB — CBC WITH DIFFERENTIAL/PLATELET
Abs Immature Granulocytes: 0.01 10*3/uL (ref 0.00–0.07)
Basophils Absolute: 0.1 10*3/uL (ref 0.0–0.1)
Basophils Relative: 3 %
Eosinophils Absolute: 0.2 10*3/uL (ref 0.0–0.5)
Eosinophils Relative: 4 %
HCT: 37.6 % — ABNORMAL LOW (ref 39.0–52.0)
Hemoglobin: 12 g/dL — ABNORMAL LOW (ref 13.0–17.0)
Immature Granulocytes: 0 %
Lymphocytes Relative: 37 %
Lymphs Abs: 1.5 10*3/uL (ref 0.7–4.0)
MCH: 24 pg — ABNORMAL LOW (ref 26.0–34.0)
MCHC: 31.9 g/dL (ref 30.0–36.0)
MCV: 75.4 fL — ABNORMAL LOW (ref 80.0–100.0)
Monocytes Absolute: 0.5 10*3/uL (ref 0.1–1.0)
Monocytes Relative: 12 %
Neutro Abs: 1.9 10*3/uL (ref 1.7–7.7)
Neutrophils Relative %: 44 %
Platelets: 105 10*3/uL — ABNORMAL LOW (ref 150–400)
RBC: 4.99 MIL/uL (ref 4.22–5.81)
RDW: 15.3 % (ref 11.5–15.5)
WBC: 4.2 10*3/uL (ref 4.0–10.5)
nRBC: 0 % (ref 0.0–0.2)

## 2020-02-12 LAB — ETHANOL: Alcohol, Ethyl (B): 165 mg/dL — ABNORMAL HIGH (ref <10)

## 2020-02-12 LAB — RESPIRATORY PANEL BY RT PCR (FLU A&B, COVID)
Influenza A by PCR: NEGATIVE
Influenza B by PCR: NEGATIVE
SARS Coronavirus 2 by RT PCR: NEGATIVE

## 2020-02-12 MED ORDER — GABAPENTIN 300 MG PO CAPS
300.0000 mg | ORAL_CAPSULE | Freq: Three times a day (TID) | ORAL | 0 refills | Status: AC
Start: 2020-02-12 — End: ?

## 2020-02-12 NOTE — ED Notes (Signed)
Pt sleeping @this  time. Pt has no c/o of pain or distress. breathing even and unlabored. Will continue to monitor for safety

## 2020-02-12 NOTE — ED Provider Notes (Signed)
FBC/OBS ASAP Discharge Summary  Date and Time: 02/12/2020 10:20 AM  Name: Riley Kim  MRN:  [id number]   Discharge Diagnoses:  Final diagnoses:  Alcohol abuse with alcohol-induced mood disorder (HCC)    Subjective: Patient states "I would like to find somewhere to stay because I have missed work for a few days when the shelter had no openings, and I need to be at work tomorrow morning." Patient reports he was recently residing in a hotel room that his supervisor at [employer] nursing facility rented for him.  Patient reports his supervisor went on vacation and he did not have enough money to remain at hotel.  Patient reports he is also employed at the [employer] on [address].  Patient concerned that he may have been terminated from both jobs related to not working for approximately 2 to 3 days.  Patient reports he has been homeless in the [place] area for approximately 5 years and is familiar with shelter resources in the area but unfortunately these 3 sources have been full for several days.  Patient reports he has chronic passive suicidal ideations times many years.  Patient denies any plan or intent to harm self.  Patient contracts verbally for safety with this Clinical research associate.  Patient denies any history of suicide attempts, denies any history of self-harm behaviors.  Patient denies HI currently.  Patient denies both auditory and visual hallucinations.  There is no evidence of delusional thought content and patient does not appear to be responding to internal stimuli.  Patient denies symptoms of paranoia.  Patient reports he drinks between 3 and 4 number 40 ounce beers daily.  Patient reports he has had very brief episodes of sobriety times approximately 1 week when suffering from pancreatitis.  Patient reports first drink at age 25 years old.  Patient reports his mother has a history of alcohol use and depression.  Patient denies substance use aside from alcohol.  Patient currently denies  any symptoms of alcohol withdrawal.  Patient assessed by nurse practitioner.  Patient alert and oriented, answers appropriately.  Patient pleasant cooperative during assessment.  Patient reports he has never been diagnosed with an mental illness and has never seen outpatient psychiatry or counseling.  Patient reports willingness to follow-up with outpatient psychiatry and counseling.  Stay Summary: Patient presented voluntarily to [facility] for walk-in assessment, complaint of suicidal ideations and depression.  Patient appropriate and cooperative during overnight stay for safety and stabilization.  Patient reports readiness to discharge today, denies crisis criteria.  Total Time spent with patient: 30 minutes  Past Psychiatric History:  Past Medical History:  Past Medical History:  Diagnosis Date  . 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 Arthritis    "right foot, I broke it before"  . Asthma   . Hypertension   . Vertigo     Past Surgical History:  Procedure Laterality Date  . REPLANTATION THUMB Right 2000   Family History: History reviewed. No pertinent family history. Family Psychiatric History: Mother-depression, alcohol use Social History:  Social History   Substance and Sexual Activity  Alcohol Use Yes   Comment: 3-4 times per week     Social History   Substance and Sexual Activity  Drug Use Yes  . Types: Marijuana   Comment: "last marijuana was ~ 2012" (12/29/2013)    Social History   Socioeconomic History  . Marital status: Legally Separated    Spouse name: Not on file  .  Number of children: Not on file  . Years of education: Not on file  . Highest education level: Not on file  Occupational History    Employer: [employer]  Tobacco Use  . Smoking status: Current Every Day Smoker    Packs/day: 0.00    Years: 0.00    Pack years: 0.00    Types: Cigarettes  . Smokeless tobacco: Never Used  . Tobacco comment: cutting  back  Substance and Sexual Activity  . Alcohol use: Yes    Comment: 3-4 times per week  . Drug use: Yes    Types: Marijuana    Comment: "last marijuana was ~ 2012" (12/29/2013)  . Sexual activity: Not on file  Other Topics Concern  . Not on file  Social History Narrative  . Not on file   Social Determinants of Health   Financial Resource Strain:   . Difficulty of Paying Living Expenses: Not on file  Food Insecurity:   . Worried About Programme researcher, broadcasting/film/video in the Last Year: Not on file  . Ran Out of Food in the Last Year: Not on file  Transportation Needs:   . Lack of Transportation (Medical): Not on file  . Lack of Transportation (Non-Medical): Not on file  Physical Activity:   . Days of Exercise per Week: Not on file  . Minutes of Exercise per Session: Not on file  Stress:   . Feeling of Stress : Not on file  Social Connections:   . Frequency of Communication with Friends and Family: Not on file  . Frequency of Social Gatherings with Friends and Family: Not on file  . Attends Religious Services: Not on file  . Active Member of Clubs or Organizations: Not on file  . Attends Banker Meetings: Not on file  . Marital Status: Not on file   SDOH:  SDOH Screenings   Alcohol Screen:   . Last Alcohol Screening Score (AUDIT): Not on file  Depression (PHQ2-9):   . PHQ-2 Score: Not on file  Financial Resource Strain:   . Difficulty of Paying Living Expenses: Not on file  Food Insecurity:   . Worried About Programme researcher, broadcasting/film/video in the Last Year: Not on file  . Ran Out of Food in the Last Year: Not on file  Housing:   . Last Housing Risk Score: Not on file  Physical Activity:   . Days of Exercise per Week: Not on file  . Minutes of Exercise per Session: Not on file  Social Connections:   . Frequency of Communication with Friends and Family: Not on file  . Frequency of Social Gatherings with Friends and Family: Not on file  . Attends Religious Services: Not on file   . Active Member of Clubs or Organizations: Not on file  . Attends Banker Meetings: Not on file  . Marital Status: Not on file  Stress:   . Feeling of Stress : Not on file  Tobacco Use: High Risk  . Smoking Tobacco Use: Current Every Day Smoker  . Smokeless Tobacco Use: Never Used  Transportation Needs:   . Freight forwarder (Medical): Not on file  . Lack of Transportation (Non-Medical): Not on file    Has this patient used any form of tobacco in the last 30 days? (Cigarettes, Smokeless Tobacco, Cigars, and/or Pipes) A prescription for an FDA-approved tobacco cessation medication was offered at discharge and the patient refused  Current Medications:  Current Facility-Administered Medications  Medication Dose  Route Frequency Provider Last Rate Last Admin  . alum & mag hydroxide-simeth (MAALOX/MYLANTA) 200-200-20 MG/5ML suspension 30 mL  30 mL Oral Q4H PRN Nira ConnBerry, Jason A, NP      . gabapentin (NEURONTIN) capsule 300 mg  300 mg Oral TID Nira ConnBerry, Jason A, NP   300 mg at 02/12/20 0919  . hydrOXYzine (ATARAX/VISTARIL) tablet 25 mg  25 mg Oral Q6H PRN Nira ConnBerry, Jason A, NP   25 mg at 02/11/20 2221  . ibuprofen (ADVIL) tablet 600 mg  600 mg Oral Q6H PRN Nira ConnBerry, Jason A, NP   600 mg at 02/12/20 0916  . loperamide (IMODIUM) capsule 2-4 mg  2-4 mg Oral PRN Nira ConnBerry, Jason A, NP      . LORazepam (ATIVAN) tablet 1 mg  1 mg Oral Q6H PRN Nira ConnBerry, Jason A, NP   1 mg at 02/12/20 0916  . magnesium hydroxide (MILK OF MAGNESIA) suspension 30 mL  30 mL Oral Daily PRN Nira ConnBerry, Jason A, NP      . multivitamin with minerals tablet 1 tablet  1 tablet Oral Daily Nira ConnBerry, Jason A, NP   1 tablet at 02/12/20 0916  . nicotine (NICODERM CQ - dosed in mg/24 hours) patch 21 mg  21 mg Transdermal Daily Nira ConnBerry, Jason A, NP   21 mg at 02/12/20 0920  . ondansetron (ZOFRAN-ODT) disintegrating tablet 4 mg  4 mg Oral Q6H PRN Nira ConnBerry, Jason A, NP      . thiamine tablet 100 mg  100 mg Oral Daily Nira ConnBerry, Jason A, NP   100 mg at  02/12/20 0916  . traZODone (DESYREL) tablet 50 mg  50 mg Oral QHS PRN Jackelyn PolingBerry, Jason A, NP   50 mg at 02/11/20 2221   Current Outpatient Medications  Medication Sig Dispense Refill  . albuterol (PROVENTIL HFA;VENTOLIN HFA) 108 (90 BASE) MCG/ACT inhaler Inhale 2 puffs into the lungs every 6 (six) hours as needed for wheezing or shortness of breath. (Patient not taking: Reported on 09/26/2015) 1 Inhaler 2  . albuterol (PROVENTIL HFA;VENTOLIN HFA) 108 (90 Base) MCG/ACT inhaler Inhale 2 puffs into the lungs every 4 (four) hours as needed for wheezing or shortness of breath (cough). (Patient not taking: Reported on 07/29/2017) 1 Inhaler 0  . famotidine (PEPCID) 20 MG tablet Take 1 tablet (20 mg total) by mouth 2 (two) times daily. 30 tablet 0  . ibuprofen (ADVIL,MOTRIN) 200 MG tablet Take 400 mg by mouth every 6 (six) hours as needed for mild pain.    . methocarbamol (ROBAXIN) 500 MG tablet Take 1 tablet (500 mg total) by mouth 2 (two) times daily. 20 tablet 0  . pantoprazole (PROTONIX) 20 MG tablet Take 1 tablet (20 mg total) by mouth 2 (two) times daily. (Patient not taking: Reported on 07/29/2017) 30 tablet 0    PTA Medications: (Not in a hospital admission)   Musculoskeletal  Strength & Muscle Tone: within normal limits Gait & Station: normal Patient leans: N/A  Psychiatric Specialty Exam  Presentation  General Appearance: Casual;Fairly Groomed  Eye Contact:Good  Speech:Clear and Coherent;Normal Rate  Speech Volume:Normal  Handedness:Right   Mood and Affect  Mood:Anxious;Depressed;Hopeless  Affect:Congruent;Depressed   Thought Process  Thought Processes:Coherent  Descriptions of Associations:Intact  Orientation:Full (Time, Place and Person)  Thought Content:Logical  Hallucinations:Hallucinations: None  Ideas of Reference:None  Suicidal Thoughts:Suicidal Thoughts: Yes, Active SI Active Intent and/or Plan: With Intent;With Plan;With Means to Carry Out  Homicidal  Thoughts:Homicidal Thoughts: Yes, Passive HI Passive Intent and/or Plan: Without Intent;Without Plan   Sensorium  Memory:Immediate Good;Recent Good;Remote Good  Judgment:Fair  Insight:Fair   Executive Functions  Concentration:Fair  Attention Span:Fair  Recall:Fair  Fund of Knowledge:Good  Language:Good   Psychomotor Activity  Psychomotor Activity:Psychomotor Activity: Normal   Assets  Assets:Communication Skills;Desire for Improvement;Physical Health   Sleep  Sleep:Sleep: Poor   Physical Exam  Physical Exam Vitals and nursing note reviewed.  Constitutional:      Appearance: He is well-developed.  HENT:     Head: Normocephalic.  Cardiovascular:     Rate and Rhythm: Normal rate.  Pulmonary:     Effort: Pulmonary effort is normal.  Neurological:     Mental Status: He is alert and oriented to person, place, and time.  Psychiatric:        Attention and Perception: Attention and perception normal.        Mood and Affect: Mood and affect normal.        Speech: Speech normal.        Behavior: Behavior normal. Behavior is cooperative.        Thought Content: Thought content includes suicidal ideation.        Cognition and Memory: Cognition and memory normal.        Judgment: Judgment normal.    Review of Systems  Constitutional: Negative.   HENT: Negative.   Eyes: Negative.   Respiratory: Negative.   Cardiovascular: Negative.   Gastrointestinal: Negative.   Genitourinary: Negative.   Musculoskeletal: Negative.   Skin: Negative.   Neurological: Negative.   Endo/Heme/Allergies: Negative.   Psychiatric/Behavioral: Positive for substance abuse and suicidal ideas.   Blood pressure 130/90, pulse (!) 108, temperature 98.7 F (37.1 C), temperature source Tympanic, resp. rate 16, height 6\' 1"  (1.854 m), weight 200 lb (90.7 kg), SpO2 97 %. Body mass index is 26.39 kg/m.  Demographic Factors:  Male and Low socioeconomic status  Loss  Factors: NA  Historical Factors: NA  Risk Reduction Factors:   Employed, Positive social support, Positive therapeutic relationship and Positive coping skills or problem solving skills  Continued Clinical Symptoms:  Alcohol/Substance Abuse/Dependencies  Cognitive Features That Contribute To Risk:  None    Suicide Risk:  Minimal: No identifiable suicidal ideation.  Patients presenting with no risk factors but with morbid ruminations; may be classified as minimal risk based on the severity of the depressive symptoms  Plan Of Care/Follow-up recommendations:  Other:  Patient reviewed with Dr. .  Patient verbalizes plan to discharge to [facility] sober [facility].  Patient completed telephone interview prior to discharge.  Patient for follow-up of medication management and counseling at [facility].   Medications include: Neurontin 300 mg 3 times daily/alcohol withdrawal and cravings  Disposition: Discharge  [facility], FNP 02/12/2020, 10:20 AM

## 2020-02-12 NOTE — Discharge Instructions (Addendum)

## 2020-02-12 NOTE — Progress Notes (Addendum)
CSW left HIPAA compliant voice message with [facility] [phone]) requesting a return phone call to assess for shelter bed availability.    Wells Guiles, MSW, LCSW, LCAS Clinical Social Worker II Disposition CSW [phone]   UPDATE 1015am: CSW spoke with Rhae Hammock at [facility] [phone]). He does have beds available at their halfway house. He asked that pt call him this morning for an interview. Pt was given the number and encouraged to call.

## 2020-02-12 NOTE — ED Notes (Signed)
Pt alert and oriented on the unit. Education, support, and encouragement provided. Discharge summary, medications and follow up appointments reviewed with pt. Suicide prevention resources provided. Pt's belongings in locker returned and belongings sheet signed. Pt denies SI/HI, A/VH, pain, or any concerns at this time. Pt ambulatory on and off unit. Pt discharged to lobby. 

## 2020-02-12 NOTE — ED Notes (Signed)
Patient given orange juice

## 2020-02-12 NOTE — ED Notes (Signed)
Pt asleep with even and unlabored respirations. No distress or discomfort noted. Pt remains safe on the unit. Will continue to monitor. 

## 2020-02-27 NOTE — Progress Notes (Signed)
Patient did not show for appointment. Called at 1400, 1407, and 1422, voicemail was not setup, and no response from patient.

## 2020-02-28 ENCOUNTER — Other Ambulatory Visit: Payer: Self-pay

## 2020-02-28 ENCOUNTER — Ambulatory Visit: Payer: Self-pay | Attending: Family | Admitting: Family

## 2020-02-28 DIAGNOSIS — Z09 Encounter for follow-up examination after completed treatment for conditions other than malignant neoplasm: Secondary | ICD-10-CM
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
